# Patient Record
Sex: Male | Born: 1940
Health system: Southern US, Community
[De-identification: ages and names within clinical notes are randomized; demographics above are authoritative.]

## PROBLEM LIST (undated history)

## (undated) DIAGNOSIS — R112 Nausea with vomiting, unspecified: Secondary | ICD-10-CM

## (undated) DIAGNOSIS — I10 Essential (primary) hypertension: Secondary | ICD-10-CM

## (undated) DIAGNOSIS — M79606 Pain in leg, unspecified: Secondary | ICD-10-CM

## (undated) DIAGNOSIS — I6529 Occlusion and stenosis of unspecified carotid artery: Secondary | ICD-10-CM

## (undated) DIAGNOSIS — I739 Peripheral vascular disease, unspecified: Secondary | ICD-10-CM

## (undated) DIAGNOSIS — I2510 Atherosclerotic heart disease of native coronary artery without angina pectoris: Secondary | ICD-10-CM

## (undated) DIAGNOSIS — E785 Hyperlipidemia, unspecified: Secondary | ICD-10-CM

## (undated) DIAGNOSIS — R51 Headache: Secondary | ICD-10-CM

## (undated) DIAGNOSIS — B059 Measles without complication: Secondary | ICD-10-CM

## (undated) DIAGNOSIS — R972 Elevated prostate specific antigen [PSA]: Secondary | ICD-10-CM

## (undated) DIAGNOSIS — J301 Allergic rhinitis due to pollen: Secondary | ICD-10-CM

## (undated) DIAGNOSIS — B269 Mumps without complication: Secondary | ICD-10-CM

## (undated) DIAGNOSIS — G459 Transient cerebral ischemic attack, unspecified: Secondary | ICD-10-CM

## (undated) DIAGNOSIS — E119 Type 2 diabetes mellitus without complications: Secondary | ICD-10-CM

## (undated) DIAGNOSIS — G2581 Restless legs syndrome: Secondary | ICD-10-CM

## (undated) DIAGNOSIS — N529 Male erectile dysfunction, unspecified: Secondary | ICD-10-CM

## (undated) DIAGNOSIS — B019 Varicella without complication: Secondary | ICD-10-CM

## (undated) DIAGNOSIS — R61 Generalized hyperhidrosis: Secondary | ICD-10-CM

## (undated) DIAGNOSIS — G629 Polyneuropathy, unspecified: Secondary | ICD-10-CM

## (undated) DIAGNOSIS — G43409 Hemiplegic migraine, not intractable, without status migrainosus: Secondary | ICD-10-CM

## (undated) DIAGNOSIS — Z87442 Personal history of urinary calculi: Secondary | ICD-10-CM

## (undated) DIAGNOSIS — J90 Pleural effusion, not elsewhere classified: Secondary | ICD-10-CM

## (undated) DIAGNOSIS — R4781 Slurred speech: Secondary | ICD-10-CM

## (undated) HISTORY — DX: Personal history of urinary calculi: Z87.442

## (undated) HISTORY — PX: VASECTOMY: SHX75

## (undated) HISTORY — DX: Pleural effusion, not elsewhere classified: J90

## (undated) HISTORY — DX: Nausea with vomiting, unspecified: R11.2

## (undated) HISTORY — DX: Hemiplegic migraine, not intractable, without status migrainosus: G43.409

## (undated) HISTORY — DX: Varicella without complication: B01.9

## (undated) HISTORY — DX: Occlusion and stenosis of unspecified carotid artery: I65.29

## (undated) HISTORY — DX: Restless legs syndrome: G25.81

## (undated) HISTORY — DX: Headache: R51

## (undated) HISTORY — DX: Polyneuropathy, unspecified: G62.9

## (undated) HISTORY — DX: Type 2 diabetes mellitus without complications: E11.9

## (undated) HISTORY — DX: Hyperlipidemia, unspecified: E78.5

## (undated) HISTORY — DX: Measles without complication: B05.9

## (undated) HISTORY — DX: Atherosclerotic heart disease of native coronary artery without angina pectoris: I25.10

## (undated) HISTORY — DX: Essential (primary) hypertension: I10

## (undated) HISTORY — DX: Peripheral vascular disease, unspecified: I73.9

## (undated) HISTORY — DX: Generalized hyperhidrosis: R61

## (undated) HISTORY — DX: Transient cerebral ischemic attack, unspecified: G45.9

## (undated) HISTORY — DX: Slurred speech: R47.81

## (undated) HISTORY — DX: Elevated prostate specific antigen (PSA): R97.20

## (undated) HISTORY — DX: Pain in leg, unspecified: M79.606

## (undated) HISTORY — DX: Male erectile dysfunction, unspecified: N52.9

## (undated) HISTORY — DX: Mumps without complication: B26.9

## (undated) HISTORY — DX: Allergic rhinitis due to pollen: J30.1

---

## 1971-02-22 HISTORY — PX: PAROTID GLAND TUMOR EXCISION: SHX5221

## 1979-02-22 HISTORY — PX: KNEE SURGERY: SHX244

## 1999-02-22 HISTORY — PX: CORONARY ARTERY BYPASS GRAFT: SHX141

## 1999-02-22 HISTORY — PX: CAROTID ENDARTERECTOMY: SUR193

## 2000-02-22 DIAGNOSIS — I2510 Atherosclerotic heart disease of native coronary artery without angina pectoris: Secondary | ICD-10-CM

## 2000-02-22 HISTORY — DX: Atherosclerotic heart disease of native coronary artery without angina pectoris: I25.10

## 2001-02-21 DIAGNOSIS — R972 Elevated prostate specific antigen [PSA]: Secondary | ICD-10-CM

## 2001-02-21 HISTORY — PX: CHOLECYSTECTOMY: SHX55

## 2001-02-21 HISTORY — DX: Elevated prostate specific antigen (PSA): R97.20

## 2005-02-21 DIAGNOSIS — J90 Pleural effusion, not elsewhere classified: Secondary | ICD-10-CM

## 2005-02-21 HISTORY — PX: LUNG SURGERY: SHX703

## 2005-02-21 HISTORY — PX: OTHER SURGICAL HISTORY: SHX169

## 2005-02-21 HISTORY — DX: Pleural effusion, not elsewhere classified: J90

## 2006-09-15 DIAGNOSIS — I6529 Occlusion and stenosis of unspecified carotid artery: Secondary | ICD-10-CM

## 2006-09-15 HISTORY — DX: Occlusion and stenosis of unspecified carotid artery: I65.29

## 2006-10-25 ENCOUNTER — Ambulatory Visit: Payer: Self-pay | Admitting: Family Medicine

## 2007-01-10 DIAGNOSIS — G2581 Restless legs syndrome: Secondary | ICD-10-CM

## 2007-01-10 HISTORY — DX: Restless legs syndrome: G25.81

## 2007-01-29 ENCOUNTER — Ambulatory Visit: Payer: Self-pay | Admitting: Family Medicine

## 2007-02-19 ENCOUNTER — Emergency Department: Payer: Self-pay | Admitting: Emergency Medicine

## 2007-05-08 DIAGNOSIS — I10 Essential (primary) hypertension: Secondary | ICD-10-CM

## 2007-05-08 HISTORY — DX: Essential (primary) hypertension: I10

## 2007-07-24 DIAGNOSIS — N529 Male erectile dysfunction, unspecified: Secondary | ICD-10-CM

## 2007-07-24 HISTORY — DX: Male erectile dysfunction, unspecified: N52.9

## 2008-01-25 DIAGNOSIS — D239 Other benign neoplasm of skin, unspecified: Secondary | ICD-10-CM

## 2008-01-25 HISTORY — DX: Other benign neoplasm of skin, unspecified: D23.9

## 2008-01-29 ENCOUNTER — Ambulatory Visit: Payer: Self-pay | Admitting: Family Medicine

## 2008-01-31 DIAGNOSIS — G629 Polyneuropathy, unspecified: Secondary | ICD-10-CM

## 2008-01-31 HISTORY — DX: Polyneuropathy, unspecified: G62.9

## 2008-02-28 ENCOUNTER — Ambulatory Visit: Payer: Self-pay | Admitting: Family Medicine

## 2008-03-24 ENCOUNTER — Ambulatory Visit: Payer: Self-pay | Admitting: Family Medicine

## 2008-04-21 ENCOUNTER — Ambulatory Visit: Payer: Self-pay | Admitting: Family Medicine

## 2008-04-24 ENCOUNTER — Encounter: Payer: Self-pay | Admitting: Cardiovascular Disease

## 2008-04-24 LAB — CONVERTED CEMR LAB
ALT: 22 units/L
Albumin: 4.2 g/dL
BUN: 17 mg/dL
Chloride: 101 meq/L
Creatinine, Ser: 1.07 mg/dL
Glucose, Bld: 113 mg/dL
LDL Cholesterol: 69 mg/dL
Potassium: 4.6 meq/L
Total Bilirubin: 0.5 mg/dL
Total Protein: 6.9 g/dL

## 2008-09-09 ENCOUNTER — Ambulatory Visit: Payer: Self-pay | Admitting: Family Medicine

## 2009-06-04 ENCOUNTER — Encounter: Payer: Self-pay | Admitting: Cardiovascular Disease

## 2009-06-05 DIAGNOSIS — I10 Essential (primary) hypertension: Secondary | ICD-10-CM | POA: Insufficient documentation

## 2009-06-05 DIAGNOSIS — J90 Pleural effusion, not elsewhere classified: Secondary | ICD-10-CM | POA: Insufficient documentation

## 2009-06-05 DIAGNOSIS — E1143 Type 2 diabetes mellitus with diabetic autonomic (poly)neuropathy: Secondary | ICD-10-CM | POA: Insufficient documentation

## 2009-06-05 DIAGNOSIS — E785 Hyperlipidemia, unspecified: Secondary | ICD-10-CM | POA: Insufficient documentation

## 2009-06-05 DIAGNOSIS — I25118 Atherosclerotic heart disease of native coronary artery with other forms of angina pectoris: Secondary | ICD-10-CM | POA: Insufficient documentation

## 2009-06-18 ENCOUNTER — Encounter: Payer: Self-pay | Admitting: Cardiovascular Disease

## 2009-06-24 ENCOUNTER — Ambulatory Visit: Payer: Self-pay | Admitting: Cardiovascular Disease

## 2010-01-04 ENCOUNTER — Ambulatory Visit: Payer: Self-pay | Admitting: Cardiovascular Disease

## 2010-01-08 ENCOUNTER — Telehealth: Payer: Self-pay | Admitting: Cardiovascular Disease

## 2010-01-21 ENCOUNTER — Encounter: Payer: Self-pay | Admitting: Cardiovascular Disease

## 2010-01-21 ENCOUNTER — Ambulatory Visit: Payer: Self-pay

## 2010-03-23 NOTE — Assessment & Plan Note (Signed)
Summary: F6M/AMD   Visit Type:  Follow-up Referring Deannah Rossi:  Dr. Nilda Simmer Primary Dajour Pierpoint:  Dr. Nilda Simmer  CC:  shortness of breath-- same.  History of Present Illness: 70 year old gentleman with a history of coronary artery disease, bypass surgery with a LIMA to the LAD, vein graft to the diagonal, diabetes, hypertension, hyperlipidemia, history of pleural effusion requiring a VATS, carotid endarterectomy on the right who presents for routine followup.  overall he is doing well. He does have trace edema though this has improved over the past several months. He's not exercising as he is supporting other members of his family who has been ill. He denies any chest pain or shortness of breath and otherwise is active with no complaints.  Carotid ultrasound in April 2000 and shows patent repair on the right, minimal disease on the left.  Stress test April 2009 shows no significant ischemia. This was a pharmacologic study.  Echocardiogram April 2009 shows normal systolic function, mild LVH, evidence of diastolic relaxation abnormality, moderately elevated right ventricular systolic pressures.  EKG shows normal sinus rhythm with rate 74 beats per minute with no significant ST or T wave changes  Preventive Screening-Counseling & Management  Alcohol-Tobacco     Smoking Status: quit  Caffeine-Diet-Exercise     Does Patient Exercise: yes      Drug Use:  no.    Current Medications (verified): 1)  Plavix 75 Mg Tabs (Clopidogrel Bisulfate) .... Take One Tablet By Mouth Daily 2)  Metformin Hcl 500 Mg Tabs (Metformin Hcl) .[name].. 1 Tab Two Times A Day 3)  Mirapex 0.25 Mg Tabs (Pramipexole Dihydrochloride) .[name].. 1 Tab By Mouth Three Times A Day 4)  Metoprolol Succinate 50 Mg Xr24h-Tab (Metoprolol Succinate) .... Take One Tablet By Mouth Daily 5)  Lipitor 40 Mg Tabs (Atorvastatin Calcium) .... Take 1 1/2 Tablet By Mouth Daily. 6)  Fexofenadine Hcl 180 Mg Tabs (Fexofenadine Hcl) .[name].. 1 Tab  As Needed 7)  Quinapril Hcl 10 Mg Tabs (Quinapril Hcl) .[name].. 1 Tab Daily 8)  Aspirin 81 Mg Tbec (Aspirin) .... Take One Tablet By Mouth Daily 9)  Multivitamins  Tabs (Multiple Vitamin) .[name].. 1 Tab Daily 10)  Nitrostat 0.4 Mg Subl (Nitroglycerin) .[name].. 1 Tablet Under Tongue At Onset of Chest Pain; You May Repeat Every 5 Minutes For Up To 3 Doses. 11)  Furosemide 20 Mg Tabs (Furosemide) .[name].. 1 Tab Daily As Needed 12)  Gabapentin 100 Mg Caps (Gabapentin) .[name].. 1 Tab Daily 13)  Finasteride 5 Mg Tabs (Finasteride) .[name].. 1 Tab Daily 14)  Fish Oil 1000 Mg Caps (Omega-3 Fatty Acids) .... Take 1 Tablet By Mouth Two Times A Day  Allergies (verified): 1)  ! * Contrast Dye 2)  ! * Dog Hair 3)  ! [facility] 4)  ! Leitha Bleak  Past History:  Past Medical History: Last updated: 06/04/2009 CAD -- bypass DM HTN hyperlipidemia pleural effusion  Past Surgical History: Last updated: 06/24/2009 bypass  Carotid Endarterectomy in 2001 Collapse Lung in 2007  Parotid Tumor in 1973  Family History: Last updated: 01/04/2010 Family History of Cancer:  Family History of Coronary Artery Disease:   Social History: Last updated: 01/04/2010 Retired  Married  Tobacco Use - Former.  Alcohol Use - no Regular Exercise - yes Drug Use - no  Risk Factors: Exercise: yes (01/04/2010)  Risk Factors: Smoking Status: quit (01/04/2010)  Family History: Family History of Cancer:  Family History of Coronary Artery Disease:   Social History: Retired  Married  Tobacco Use - Former.  Alcohol Use - no Regular Exercise - yes Drug Use - no Smoking Status:  quit Does Patient Exercise:  yes Drug Use:  no  Review of Systems  The patient denies fever, weight loss, weight gain, vision loss, decreased hearing, hoarseness, chest pain, syncope, dyspnea on exertion, peripheral edema, prolonged cough, abdominal pain, incontinence, muscle weakness, depression, and enlarged lymph nodes.    Vital  Signs:  Patient profile:   70 year old male Height:      68 inches Weight:      217 pounds BMI:     33.11 Pulse rate:   74 / minute BP sitting:   130 / 72  (right arm) Cuff size:   regular  Vitals Entered By: Hardin Negus, RMA (January 04, 2010 4:04 PM)  Physical Exam  General:  well-appearing middle-aged gentleman in no apparent distress, HEENT exam is benign, oropharynx is clear, neck is supple with no JVP or carotid bruits, heart sounds are regular with S1-S2 no murmurs appreciated, lungs are clear to auscultation with no wheezes or Rales, abdominal exam is benign, Trace lower extremity edema b/l, neurologic exam is grossly nonfocal, skin is warm and dry. Pulses are equal and symmetrical in his upper and lower extremities.   Impression & Recommendations:  Problem # 1:  CAROTID ENDARTERECTOMY, RIGHT, HX OF (ICD-V15.1) we'll continue aggressive lipid management. Would continue Lipitor. his lipid panel was excellent in March of this year. We will try to identify if he was taking Lipitor 40 mg or 60 mg at this time. We will order a carotid ultrasound be done early next year.  Orders: Carotid Duplex (Carotid Duplex)  Problem # 2:  HYPERLIPIDEMIA (ICD-272.4) goal LDL is less than 70. He achieved this goals by our records in March of this year.  His updated medication list for this problem includes:    Lipitor 40 Mg Tabs (Atorvastatin calcium) .[name]... Take 1 1/2 tablet by mouth daily.  Problem # 3:  DIABETES MELLITUS, TYPE II (ICD-250.00) We have stressed to him the importance of weight, diet and watching his sugars.  His updated medication list for this problem includes:    Metformin Hcl 500 Mg Tabs (Metformin hcl) .[name]... 1 tab two times a day    Quinapril Hcl 10 Mg Tabs (Quinapril hcl) .[name]... 1 tab daily    Aspirin 81 Mg Tbec (Aspirin) .[name]... Take one tablet by mouth daily  Problem # 4:  CAD (ICD-414.00) No symptoms of coronary artery disease. No further testing at this  time.  His updated medication list for this problem includes:    Plavix 75 Mg Tabs (Clopidogrel bisulfate) .[name]... Take one tablet by mouth daily    Metoprolol Succinate 50 Mg Xr24h-tab (Metoprolol succinate) .[name]... Take one tablet by mouth daily    Quinapril Hcl 10 Mg Tabs (Quinapril hcl) .[name]... 1 tab daily    Aspirin 81 Mg Tbec (Aspirin) .[name]... Take one tablet by mouth daily    Nitrostat 0.4 Mg Subl (Nitroglycerin) .[name]... 1 tablet under tongue at onset of chest pain; you may repeat every 5 minutes for up to 3 doses.  Orders: EKG w/ Interpretation (93000)  Patient Instructions: 1)  Your physician recommends that you schedule a follow-up appointment in: 6 months 2)  Your physician has requested that you have a carotid duplex. This test is an ultrasound of the carotid arteries in your neck. It looks at blood flow through these arteries that supply the brain with blood. Allow one hour  for this exam. There are no restrictions or special instructions.

## 2010-03-23 NOTE — Assessment & Plan Note (Signed)
Summary: NP6/AMD   Visit Type:  Initial Consult Referring Provider:  Dr. Nilda Simmer Primary Provider:  Dr. Nilda Simmer  CC:  Patient have no edema in ankle, feet, and or hand. No complaint of SOB or chest pain or discomfort. Patient is visiting Dr. Mariah Milling for routine annual check up.[name]  History of Present Illness: 70 year old gentleman with a history of coronary artery disease, bypass surgery with a LIMA to the LAD, vein graft to the diagonal, diabetes, hypertension, hyperlipidemia, history of pleural effusion requiring a VATS, carotid endarterectomy on the right who presents to reestablish care. He was last seen at [facility] by myself in May of 2010.  Overall he states that he feels well. He denies any significant shortness of breath or chest pain. He is exercising at the gym 3-4 times per week under the guidance of his daughter who is an exercise trainer. He works out for 30 minutes at a time and sometimes more. He denies any significant lower extremity edema.  Carotid ultrasound in April 2000 and shows patent repair on the right, minimal disease on the left.  Stress test April 2009 shows no significant ischemia. This was a pharmacologic study.  Echocardiogram April 2009 shows normal systolic function, mild LVH, evidence of diastolic relaxation abnormality, moderately elevated right ventricular systolic pressures.  Current Medications (verified): 1)  Plavix 75 Mg Tabs (Clopidogrel Bisulfate) .... Take One Tablet By Mouth Daily 2)  Metformin Hcl 500 Mg Tabs (Metformin Hcl) .[name].. 1 Tab Two Times A Day 3)  Mirapex 0.25 Mg Tabs (Pramipexole Dihydrochloride) .[name].. 1 Tab By Mouth Three Times A Day 4)  Toprol Xl 50 Mg Xr24h-Tab (Metoprolol Succinate) .[name].. 1 Tab Daily 5)  Flomax 0.4 Mg Caps (Tamsulosin Hcl) .[name].. 1 Tab Daily 6)  Lipitor 60 Mg Tabs (Atorvastatin Calcium) .[name].. 1 Tab At Bedtime 7)  Fexofenadine Hcl 180 Mg Tabs (Fexofenadine Hcl) .[name].. 1 Tab As Needed 8)   Quinapril Hcl 10 Mg Tabs (Quinapril Hcl) .[name].. 1 Tab Daily 9)  Aspirin 81 Mg Tbec (Aspirin) .... Take One Tablet By Mouth Daily 10)  Multivitamins  Tabs (Multiple Vitamin) .[name].. 1 Tab Daily 11)  Nitrostat 0.4 Mg Subl (Nitroglycerin) .[name].. 1 Tablet Under Tongue At Onset of Chest Pain; You May Repeat Every 5 Minutes For Up To 3 Doses. 12)  Furosemide 20 Mg Tabs (Furosemide) .[name].. 1 Tab Daily As Needed 13)  Gabapentin 100 Mg Caps (Gabapentin) .[name].. 1 Tab Daily 14)  Finasteride 5 Mg Tabs (Finasteride) .[name].. 1 Tab Daily 15)  Fish Oil 1000 Mg Caps (Omega-3 Fatty Acids) .... Take 1 Tablet By Mouth Two Times A Day  Allergies (verified): 1)  ! * Contrast Dye 2)  ! * Dog Hair 3)  ! [facility] 4)  ! Leitha Bleak  Past History:  Past Surgical History: bypass  Carotid Endarterectomy in 2001 Collapse Lung in 2007  Parotid Tumor in 1973  Review of Systems  The patient denies fever, weight loss, weight gain, vision loss, decreased hearing, hoarseness, chest pain, syncope, dyspnea on exertion, peripheral edema, prolonged cough, abdominal pain, incontinence, muscle weakness, depression, and enlarged lymph nodes.    Vital Signs:  Patient profile:   70 year old male Height:      68 inches Weight:      217 pounds BMI:     33.11 Pulse rate:   70 / minute BP sitting:   134 / 76  (left arm) Cuff size:   large  Physical  Exam  General:  well-appearing middle-aged gentleman in no apparent distress, HEENT exam is benign, oropharynx is clear, neck is supple with no JVP or carotid bruits, heart sounds are regular with S1-S2 no murmurs appreciated, lungs are clear to auscultation with no wheezes or Rales, abdominal exam is benign, no significant lower extremity edema, neurologic exam is grossly nonfocal, skin is warm and dry. Pulses are equal and symmetrical in his upper and lower extremities.    EKG  Procedure date:  06/24/2009  Findings:      normal sinus rhythm with rate 70 beats per minute, no  significant ST or T wave changes.  Impression & Recommendations:  Problem # 1:  CAD (ICD-414.00) history of CAD and bypass surgery x2. Last stress test was -2 years ago. He is exercising with no symptoms. We'll hold off on any additional stress testing and at this time discussed this with him on his next visit. We'll continue his current medications.  We will continue him on 2 baby aspirins.  His updated medication list for this problem includes:    Plavix 75 Mg Tabs (Clopidogrel bisulfate) .[name]... Take one tablet by mouth daily    Quinapril Hcl 10 Mg Tabs (Quinapril hcl) .[name]... 1 tab daily    Aspirin 81 Mg Tbec (Aspirin) .[name]... Take two tablet by mouth daily    Nitrostat 0.4 Mg Subl (Nitroglycerin) .[name]... 1 tablet under tongue at onset of chest pain; you may repeat every 5 minutes for up to 3 doses.  Problem # 2:  HYPERLIPIDEMIA (ICD-272.4) He is currently on Lipitor 60 mg daily. We will try to obtain his most recent lipid panel from Dr. Katrinka Blazing for our records.  Problem # 3:  DIABETES MELLITUS, TYPE II (ICD-250.00) he does not check his sugars at frequently and states that his numbers are typically been 120. His weight has been stable.  His updated medication list for this problem includes:    Metformin Hcl 500 Mg Tabs (Metformin hcl) .[name]... 1 tab two times a day    Quinapril Hcl 10 Mg Tabs (Quinapril hcl) .[name]... 1 tab daily    Aspirin 81 Mg Tbec (Aspirin) .[name]... Take one tablet by mouth daily  Problem # 4:  HYPERTENSION (ICD-401.9) Blood pressure is well-controlled on his current medication regimen.   His updated medication list for this problem includes:    Quinapril Hcl 10 Mg Tabs (Quinapril hcl) .[name]... 1 tab daily    Aspirin 81 Mg Tbec (Aspirin) .[name]... Take one tablet by mouth daily    Furosemide 20 Mg Tabs (Furosemide) .[name]... 1 tab daily as needed

## 2010-03-23 NOTE — Miscellaneous (Signed)
Summary: Visual merchandiser   Imported By: Harlon Flor 06/24/2009 11:02:46  _____________________________________________________________________  External Attachment:    Type:   Image     Comment:   External Document

## 2010-03-23 NOTE — Progress Notes (Signed)
Summary: PHI  PHI   Imported By: Harlon Flor 06/25/2009 10:31:55  _____________________________________________________________________  External Attachment:    Type:   Image     Comment:   External Document

## 2010-03-23 NOTE — Progress Notes (Signed)
Summary: plavix  Phone Note Call from Patient   Caller: Patient Call For: plavix Summary of Call: pt was in last week and needed plavix samples. we didn't have any samples and nurse told him to call back today to see if we have any samples.  Initial call taken by: Lysbeth Galas CMA,  January 08, 2010 11:07 AM  Follow-up for Phone Call        Called spoke with pt advised left 2 boxes of Plavix samples at front desk for pick-up.  Follow-up by: Cloyde Reams RN,  January 08, 2010 2:06 PM

## 2010-03-23 NOTE — Progress Notes (Signed)
Summary: RECORD RELEASE  RECORD RELEASE   Imported By: Frazier Butt Chriscoe 06/18/2009 11:41:10  _____________________________________________________________________  External Attachment:    Type:   Image     Comment:   External Document

## 2010-08-09 LAB — LIPID PANEL: Cholesterol: 144 mg/dL (ref 0–200)

## 2010-09-06 ENCOUNTER — Encounter: Payer: Self-pay | Admitting: Cardiovascular Disease

## 2010-12-06 ENCOUNTER — Inpatient Hospital Stay: Payer: Self-pay | Admitting: *Deleted

## 2011-02-10 ENCOUNTER — Ambulatory Visit: Payer: Self-pay | Admitting: Cardiovascular Disease

## 2011-02-11 ENCOUNTER — Encounter: Payer: Self-pay | Admitting: Cardiovascular Disease

## 2011-02-11 ENCOUNTER — Ambulatory Visit (INDEPENDENT_AMBULATORY_CARE_PROVIDER_SITE_OTHER): Payer: Medicare Other | Admitting: Cardiovascular Disease

## 2011-02-11 DIAGNOSIS — E119 Type 2 diabetes mellitus without complications: Secondary | ICD-10-CM

## 2011-02-11 DIAGNOSIS — I10 Essential (primary) hypertension: Secondary | ICD-10-CM

## 2011-02-11 DIAGNOSIS — E785 Hyperlipidemia, unspecified: Secondary | ICD-10-CM

## 2011-02-11 DIAGNOSIS — I2510 Atherosclerotic heart disease of native coronary artery without angina pectoris: Secondary | ICD-10-CM

## 2011-02-11 DIAGNOSIS — G459 Transient cerebral ischemic attack, unspecified: Secondary | ICD-10-CM | POA: Insufficient documentation

## 2011-02-11 NOTE — Progress Notes (Signed)
Patient ID: ONTARIO PETTENGILL, male    DOB: [birth year], 70 y.o.   MRN: [id number]  HPI Comments: 70 year old gentleman with a history of coronary artery disease, bypass surgery with a LIMA to the LAD, vein graft to the diagonal, diabetes, hypertension, hyperlipidemia, history of pleural effusion requiring a VATS, carotid endarterectomy on the right who presents for routine followup.  He reports having recent TIA symptoms with admission to the hospital at [facility] October 2012. He had a head CT him a MRI, carotid Dopplers, echocardiogram that was essentially normal. Symptoms included severe headache, tingling of the left arm and left face, slurred speech, vision changes. He was seen by neurology in [place] who felt he was having a complex migraine. He does report a previous TIA in 2001. No further symptoms since that time.   Otherwise he is doing well.  He's not exercising. He denies any chest pain or shortness of breath and otherwise is active with no complaints.   Stress test April 2009 shows no significant ischemia. This was a pharmacologic study.   EKG shows normal sinus rhythm with rate 67 beats per minute with no significant ST or T wave changes    Outpatient Encounter Prescriptions as of 02/11/2011  Medication Sig Dispense Refill  . aspirin 325 MG tablet Take 325 mg by mouth daily.        [name] atorvastatin (LIPITOR) 40 MG tablet Take 40 mg by mouth daily. Take 1.5 tabs       . clopidogrel (PLAVIX) 75 MG tablet Take 75 mg by mouth daily.        . finasteride (PROSCAR) 5 MG tablet Take 5 mg by mouth daily.        . furosemide (LASIX) 20 MG tablet Take 20 mg by mouth daily as needed.       . gabapentin (NEURONTIN) 100 MG capsule Take 100 mg by mouth daily.        . metFORMIN (GLUMETZA) 500 MG (MOD) 24 hr tablet Take 500 mg by mouth 2 (two) times daily.        . metoprolol (TOPROL-XL) 50 MG 24 hr tablet Take 50 mg by mouth daily.        . Multiple Vitamins-Minerals (MULTIVITAL) tablet Take 1  tablet by mouth daily.        . nitroGLYCERIN (NITROSTAT) 0.4 MG SL tablet Place 0.4 mg under the tongue every 5 (five) minutes as needed.        . Omega-3 Fatty Acids (FISH OIL) 1000 MG CAPS Take by mouth 2 (two) times daily.        . pramipexole (MIRAPEX) 0.25 MG tablet Take 0.25 mg by mouth 3 (three) times daily.        . quinapril (ACCUPRIL) 10 MG tablet Take 10 mg by mouth daily.          Review of Systems  Constitutional: Negative.   HENT: Negative.   Eyes: Negative.   Respiratory: Negative.   Cardiovascular: Negative.   Gastrointestinal: Negative.   Musculoskeletal: Negative.   Skin: Negative.   Neurological: Negative.   Hematological: Negative.   Psychiatric/Behavioral: Negative.   All other systems reviewed and are negative.    BP 110/64  Pulse 67  Ht 5' 8.5" (1.74 m)  Wt 215 lb 6.4 oz (97.705 kg)  BMI 32.28 kg/m2  Physical Exam  Nursing note and vitals reviewed. Constitutional: He is oriented to person, place, and time. He appears well-developed and well-nourished.  HENT:  Head: Normocephalic.  Nose: Nose normal.  Mouth/Throat: Oropharynx is clear and moist.  Eyes: Conjunctivae are normal. Pupils are equal, round, and reactive to light.  Neck: Normal range of motion. Neck supple. No JVD present.  Cardiovascular: Normal rate, regular rhythm, S1 normal, S2 normal, normal heart sounds and intact distal pulses.  Exam reveals no gallop and no friction rub.   No murmur heard. Pulmonary/Chest: Effort normal and breath sounds normal. No respiratory distress. He has no wheezes. He has no rales. He exhibits no tenderness.  Abdominal: Soft. Bowel sounds are normal. He exhibits no distension. There is no tenderness.  Musculoskeletal: Normal range of motion. He exhibits no edema and no tenderness.  Lymphadenopathy:    He has no cervical adenopathy.  Neurological: He is alert and oriented to person, place, and time. Coordination normal.  Skin: Skin is warm and dry. No rash  noted. No erythema.  Psychiatric: He has a normal mood and affect. His behavior is normal. Judgment and thought content normal.           Assessment and Plan

## 2011-02-11 NOTE — Assessment & Plan Note (Signed)
Cholesterol is mildly elevated. We have asked him to try to lose some weight. Goal LDL less than 70. If he is unable to lose weight, we could consider increasing the Lipitor.

## 2011-02-11 NOTE — Assessment & Plan Note (Signed)
Currently with no symptoms of angina. No further workup at this time. Continue current medication regimen. 

## 2011-02-11 NOTE — Assessment & Plan Note (Addendum)
Symptoms concerning for TIA.  If he has additional symptoms in the future, we would order a saline bubble study to exclude PFO. Additional episodes and we would also suggest changing to warfarin. If he does have a PFO, this could potentially be closed if he has additional episodes. I am concerned about him being on high-dose aspirin and Plavix. I suggested he decrease the aspirin to 81 mg x2 with Plavix.

## 2011-02-11 NOTE — Assessment & Plan Note (Signed)
Blood pressure is well controlled on today's visit. No changes made to the medications. 

## 2011-02-11 NOTE — Patient Instructions (Signed)
You are doing well. No medication changes were made.  Please call us if you have new issues that need to be addressed before your next appt.  Your physician wants you to follow-up in: 6 months.  You will receive a reminder letter in the mail two months in advance. If you don't receive a letter, please call our office to schedule the follow-up appointment.   

## 2011-02-11 NOTE — Assessment & Plan Note (Signed)
We have encouraged continued exercise, careful diet management in an effort to lose weight. 

## 2011-03-11 ENCOUNTER — Ambulatory Visit: Payer: Self-pay | Admitting: Family Medicine

## 2011-04-13 ENCOUNTER — Encounter: Payer: Self-pay | Admitting: *Deleted

## 2011-04-27 ENCOUNTER — Ambulatory Visit: Payer: Self-pay | Admitting: Family Medicine

## 2011-05-13 LAB — COMPREHENSIVE METABOLIC PANEL
Albumin: 4 g/dL (ref 3.4–5.0)
Alkaline Phosphatase: 73 U/L (ref 50–136)
BUN: 22 mg/dL — ABNORMAL HIGH (ref 7–18)
Bilirubin,Total: 0.3 mg/dL (ref 0.2–1.0)
Creatinine: 0.82 mg/dL (ref 0.60–1.30)
Glucose: 110 mg/dL — ABNORMAL HIGH (ref 65–99)
Osmolality: 278 (ref 275–301)
Potassium: 4.5 mmol/L (ref 3.5–5.1)
Sodium: 137 mmol/L (ref 136–145)
Total Protein: 7.5 g/dL (ref 6.4–8.2)

## 2011-05-13 LAB — CBC
Platelet: 236 10*3/uL (ref 150–440)
RBC: 4.89 10*6/uL (ref 4.40–5.90)
RDW: 14.6 % — ABNORMAL HIGH (ref 11.5–14.5)
WBC: 9.9 10*3/uL (ref 3.8–10.6)

## 2011-05-14 ENCOUNTER — Inpatient Hospital Stay: Payer: Self-pay | Admitting: Internal Medicine

## 2011-05-14 LAB — URINALYSIS, COMPLETE
Bacteria: NONE SEEN
Leukocyte Esterase: NEGATIVE
Nitrite: NEGATIVE
Ph: 5 (ref 4.5–8.0)
Protein: 30
RBC,UR: 14 /HPF (ref 0–5)
Specific Gravity: 1.044 (ref 1.003–1.030)

## 2011-05-14 LAB — CK TOTAL AND CKMB (NOT AT ARMC)
CK, Total: 66 U/L (ref 35–232)
CK, Total: 70 U/L (ref 35–232)
CK, Total: 64 U/L (ref 35–232)
CK-MB: 1.1 ng/mL (ref 0.5–3.6)
CK-MB: 1 ng/mL (ref 0.5–3.6)

## 2011-05-14 LAB — TROPONIN I
Troponin-I: <0.02 ng/mL
Troponin-I: <0.02 ng/mL

## 2011-05-15 LAB — BASIC METABOLIC PANEL
Anion Gap: 14 (ref 7–16)
Calcium, Total: 8.3 mg/dL — ABNORMAL LOW (ref 8.5–10.1)
Co2: 23 mmol/L (ref 21–32)
EGFR (African American): >60
Potassium: 3.8 mmol/L (ref 3.5–5.1)
Sodium: 141 mmol/L (ref 136–145)

## 2011-05-15 LAB — CBC WITH DIFFERENTIAL/PLATELET
Basophil %: 0.2 %
Eosinophil %: 0.1 %
HCT: 35.8 % — ABNORMAL LOW (ref 40.0–52.0)
HGB: 12 g/dL — ABNORMAL LOW (ref 13.0–18.0)
Lymphocyte #: 2.8 10*3/uL (ref 1.0–3.6)
Lymphocyte %: 27.4 %
MCHC: 33.6 g/dL (ref 32.0–36.0)
Monocyte #: 1.1 10*3/uL — ABNORMAL HIGH (ref 0.0–0.7)
Monocyte %: 10.4 %
Neutrophil #: 6.4 10*3/uL (ref 1.4–6.5)
RBC: 4.07 10*6/uL — ABNORMAL LOW (ref 4.40–5.90)
RDW: 14.7 % — ABNORMAL HIGH (ref 11.5–14.5)
WBC: 10.4 10*3/uL (ref 3.8–10.6)

## 2011-05-15 LAB — LIPID PANEL
Cholesterol: 128 mg/dL
HDL Cholesterol: 29 mg/dL — ABNORMAL LOW
Ldl Cholesterol, Calc: 66 mg/dL
Triglycerides: 167 mg/dL
VLDL Cholesterol, Calc: 33 mg/dL

## 2011-05-15 LAB — TROPONIN I: Troponin-I: <0.02 ng/mL

## 2011-05-18 ENCOUNTER — Telehealth: Payer: Self-pay | Admitting: Cardiovascular Disease

## 2011-05-18 NOTE — Telephone Encounter (Signed)
Could start HCTZ Dutoprol is metoprolol with HCTZ He will likely need clonidine as well given how high his numbers are. He needs to cut back on his salt intake. Is his weight elevated from before?

## 2011-05-18 NOTE — Telephone Encounter (Signed)
Pt called stating that he was in the hospital over the weekend. Pt states that he is still having BP issues. Its staying in the low to mid 80's / 90's. Pt would like to speak to a nurse

## 2011-05-18 NOTE — Telephone Encounter (Signed)
Kevin Choi also was wondering about the medication, Dutoprol.  He knows someone who is on this medication and that person's bp went down dramatically after starting it.

## 2011-05-18 NOTE — Telephone Encounter (Signed)
Kevin Choi is calling to report that he was admitted to [facility] for an elevated bp.  Since being discharged from [facility] on Sunday, his bp has remained high.  Today it is running 182/93, 189/89, 193/85, 188/85, 170/85.  These readings were throughout the day, the first one was prior to taking his meds.  He is taking Metoprolol 50mg  at 4:00pm and Quinipril 20mg  at 8:00am.  His quinipril was increased yesterday from 10mg -20mg  by his pcp, Dr Katrinka Blazing.  He was also given an rx for Clonidine and HCTZ (he thinks) but didn't fill them.  He states his pcp told him not to to fill them yet.  He states his pcp told him he needs to be evaluated by Dr Mariah Milling within 1-2 weeks.  Please advise on what he is to do about his elevated bp and an appt.  Thanks.

## 2011-05-19 NOTE — Telephone Encounter (Signed)
Mrs Maher was notified and states he weighed 202# at the hospital.  He weighed 215# in Dec., but Mrs Schueller states he has swelling in his ankles and "looks puffy".  They are requesting that Dr Mariah Milling order a bubble test.  He will start the rx for HCTZ 25mg .  Do you want him to start the Clonidine 0.2mg  now or wait?  He also wants to know if he needs a Cardiology appt soon?

## 2011-05-19 NOTE — Telephone Encounter (Signed)
Would probably hold off on starting clonidine and use HCTZ for now with close monitoring of the blood pressure.  I don't know what a bubble test is? is that an echocardiogram? We'll put him on the schedule for next available with me.

## 2011-05-23 ENCOUNTER — Encounter: Payer: Self-pay | Admitting: Cardiovascular Disease

## 2011-05-23 ENCOUNTER — Ambulatory Visit (INDEPENDENT_AMBULATORY_CARE_PROVIDER_SITE_OTHER): Payer: Medicare Other | Admitting: Cardiovascular Disease

## 2011-05-23 VITALS — BP 130/70 | HR 93 | Ht 68.0 in | Wt 207.5 lb

## 2011-05-23 DIAGNOSIS — I10 Essential (primary) hypertension: Secondary | ICD-10-CM

## 2011-05-23 DIAGNOSIS — E785 Hyperlipidemia, unspecified: Secondary | ICD-10-CM

## 2011-05-23 DIAGNOSIS — I2510 Atherosclerotic heart disease of native coronary artery without angina pectoris: Secondary | ICD-10-CM

## 2011-05-23 DIAGNOSIS — G459 Transient cerebral ischemic attack, unspecified: Secondary | ICD-10-CM

## 2011-05-23 DIAGNOSIS — E119 Type 2 diabetes mellitus without complications: Secondary | ICD-10-CM

## 2011-05-23 MED ORDER — RIVAROXABAN 20 MG PO TABS: 20.0000 mg | ORAL_TABLET | Freq: Every day | ORAL | Status: DC

## 2011-05-23 NOTE — Telephone Encounter (Signed)
Spoke with pt wife, aware of dr [employer] recommendations. They have an appointment with dr Mariah Milling this morning.

## 2011-05-23 NOTE — Assessment & Plan Note (Signed)
Cholesterol is at goal on the current lipid regimen. No changes to the medications were made.  

## 2011-05-23 NOTE — Assessment & Plan Note (Signed)
Blood pressure is improved on his car medications. Heart rate is mildly elevated. We have asked him to increase his fluid intake, monitor his blood pressure and heart rate. His heart rate continues to be elevated in the 90s, we could increase his metoprolol at a later date.

## 2011-05-23 NOTE — Patient Instructions (Addendum)
Please start xarelto one a day  Stop plavix and aspirin   Please call us if you have new issues that need to be addressed before your next appt.  Your physician wants you to follow-up in: 6 months.  You will receive a reminder letter in the mail two months in advance. If you don't receive a letter, please call our office to schedule the follow-up appointment.

## 2011-05-23 NOTE — Assessment & Plan Note (Signed)
Currently with no symptoms of angina. No further workup at this time. Continue current medication regimen. 

## 2011-05-23 NOTE — Assessment & Plan Note (Signed)
Second TIA in several months despite being on aspirin and Plavix. We have suggested he start warfarin. He is not particularly eager to try warfarin and would prefer an alternative. We have suggested he could start xarelto 20 mg daily. We will hold the aspirin and Plavix for now. Given he is on stronger anticoagulation, he would likely not need a bubble study to look for PFO as this would be the treatment for positive bubble study. We have ordered a 2 day Holter as he does report occasional palpitations. We would be watching for atrial fibrillation.

## 2011-05-23 NOTE — Assessment & Plan Note (Signed)
Hemoglobin A1c 7.2. We have encouraged continued exercise, careful diet management in an effort to lose weight.  

## 2011-05-23 NOTE — Progress Notes (Signed)
Patient ID: Kevin Choi, male    DOB: September 21, 1940, 71 y.o.   MRN: [id number]  HPI Comments: 71 year old gentleman with a history of coronary artery disease, bypass surgery with a LIMA to the LAD, vein graft to the diagonal, diabetes, hypertension, hyperlipidemia, history of pleural effusion requiring a VATS, carotid endarterectomy on the right who presents for routine followup. He has had TIA symptoms twice, once October 2012 and now again May 14 2011.  He reports that on March 23, he was doing well when he had acute onset of slurred speech. He was playing cards shortly after and he was unable to do any math. Symptoms lasted for approximately one to 2 hours. Blood pressure prior to this event had been well controlled though during and after the event, he had severe hypertension with systolic pressures sometimes greater than 200. He was in the emergency room for 2 days with extensive testing.  Previous TIA symptoms in October 2012. Please see prior note.  During his recent hospital visit, he had carotid ultrasound that showed no hemodynamically significant stenosis Head CT scan showing no acute stroke MRI of the head showing no infarction. Stable appearing area of enhancement of the right temporal lobe most compatible with meningioma Chest x-ray was essentially normal  He was HCTZ several days ago and blood pressure has improved from 170 range down to 130s over 60s He has had a difficult time recovering from his recent event and feels weaker. He is able to walk less than one block secondary to fatigue.   Stress test April 2009 shows no significant ischemia. This was a pharmacologic study.  Recent lab work shows total cholesterol 128, LDL 66, HDL 29  Hemoglobin A1c 7.2 EKG shows normal sinus rhythm with rate 93 beats per minute with no significant ST or T wave changes    Outpatient Encounter Prescriptions as of 05/23/2011  Medication Sig Dispense Refill  . aspirin 81 MG tablet Take 81 mg by  mouth 2 (two) times daily.      [name] atorvastatin (LIPITOR) 40 MG tablet Take 40 mg by mouth daily.       . clopidogrel (PLAVIX) 75 MG tablet Take 75 mg by mouth daily.        . finasteride (PROSCAR) 5 MG tablet Take 5 mg by mouth daily.        . furosemide (LASIX) 20 MG tablet Take 20 mg by mouth daily as needed.       . gabapentin (NEURONTIN) 100 MG capsule Take 200 mg by mouth 3 (three) times daily.       . hydrochlorothiazide (HYDRODIURIL) 25 MG tablet Take 25 mg by mouth daily.      . metFORMIN (GLUMETZA) 500 MG (MOD) 24 hr tablet Take 500 mg by mouth 2 (two) times daily.        . metoprolol (TOPROL-XL) 50 MG 24 hr tablet Take 50 mg by mouth daily.        . Multiple Vitamins-Minerals (MULTIVITAL) tablet Take 1 tablet by mouth daily.        . nitroGLYCERIN (NITROSTAT) 0.4 MG SL tablet Place 0.4 mg under the tongue every 5 (five) minutes as needed.        . Omega-3 Fatty Acids (FISH OIL) 1000 MG CAPS Take by mouth 2 (two) times daily.        . pramipexole (MIRAPEX) 0.25 MG tablet Take 0.25 mg by mouth 3 (three) times daily.        . quinapril (  ACCUPRIL) 10 MG tablet Take 20 mg by mouth daily.        Review of Systems  Constitutional: Negative.   HENT: Negative.   Eyes: Negative.   Respiratory: Negative.   Cardiovascular: Negative.   Gastrointestinal: Negative.   Musculoskeletal: Negative.   Skin: Negative.   Neurological: Negative.   Hematological: Negative.   Psychiatric/Behavioral: Negative.   All other systems reviewed and are negative.   BP 130/70  Pulse 93  Ht 5\' 8"  (1.727 m)  Wt 207 lb 8 oz (94.121 kg)  BMI 31.55 kg/m2  Physical Exam  Nursing note and vitals reviewed. Constitutional: He is oriented to person, place, and time. He appears well-developed and well-nourished.  HENT:  Head: Normocephalic.  Nose: Nose normal.  Mouth/Throat: Oropharynx is clear and moist.  Eyes: Conjunctivae are normal. Pupils are equal, round, and reactive to light.  Neck: Normal range of  motion. Neck supple. No JVD present.  Cardiovascular: Normal rate, regular rhythm, S1 normal, S2 normal, normal heart sounds and intact distal pulses.  Exam reveals no gallop and no friction rub.   No murmur heard. Pulmonary/Chest: Effort normal and breath sounds normal. No respiratory distress. He has no wheezes. He has no rales. He exhibits no tenderness.  Abdominal: Soft. Bowel sounds are normal. He exhibits no distension. There is no tenderness.  Musculoskeletal: Normal range of motion. He exhibits no edema and no tenderness.  Lymphadenopathy:    He has no cervical adenopathy.  Neurological: He is alert and oriented to person, place, and time. Coordination normal.  Skin: Skin is warm and dry. No rash noted. No erythema.  Psychiatric: He has a normal mood and affect. His behavior is normal. Judgment and thought content normal.           Assessment and Plan

## 2011-05-31 ENCOUNTER — Other Ambulatory Visit (HOSPITAL_COMMUNITY): Payer: Medicare Other

## 2011-06-07 ENCOUNTER — Other Ambulatory Visit: Payer: Self-pay

## 2011-06-07 MED ORDER — RIVAROXABAN 20 MG PO TABS: 20.0000 mg | ORAL_TABLET | Freq: Every day | ORAL | Status: DC

## 2011-06-07 MED ORDER — METOPROLOL SUCCINATE ER 50 MG PO TB24: ORAL_TABLET | ORAL | Status: DC

## 2011-06-08 ENCOUNTER — Telehealth: Payer: Self-pay | Admitting: Cardiovascular Disease

## 2011-06-08 NOTE — Telephone Encounter (Signed)
Spoke with pt, he saw Dr Mariah Milling in the office on 05/23/11 started on Xarelto 20mg  qd, samples given in office.  Pt needs rx sent to [facility] mail order pharmacy.  Pt also wanted to know if he should take ASA with Xarelto because when he checks his BS he doesn't bleed very much.  Advised pt bleeding from Blood Sugar finger stick is not a good representation of thickness or thiness of blood.  Advised pt to follow Dr Windell Hummingbird previous recommendations of Xarelto 20mg  qd and d/c ASA and Plavix.  Pt states Dr Mariah Milling increased his Metoprolol as a result of increased palpitations on holter monitor to 75mg  qd and he needs new rx sent to [facility] pharmacy for increased dosage amount. Discussed with Dr Mariah Milling, he advised he did recommend increasing Metoprolol to 75mg  qd, OK to sent rx for 50mg  bid to allow extra in case dosage needs to be adjusted in future. Pt aware rx sent to pharmacy.

## 2011-06-24 ENCOUNTER — Encounter: Payer: Self-pay | Admitting: Cardiovascular Disease

## 2011-08-09 LAB — LIPID PANEL: LDL Cholesterol: 73 mg/dL

## 2011-08-09 LAB — HEMOGLOBIN A1C: Hgb A1c MFr Bld: 6.4 % — AB (ref 4.0–6.0)

## 2011-10-27 ENCOUNTER — Encounter: Payer: Self-pay | Admitting: *Deleted

## 2011-10-27 ENCOUNTER — Encounter: Payer: Self-pay | Admitting: Family Medicine

## 2011-10-31 ENCOUNTER — Encounter: Payer: Self-pay | Admitting: Family Medicine

## 2011-10-31 ENCOUNTER — Ambulatory Visit (INDEPENDENT_AMBULATORY_CARE_PROVIDER_SITE_OTHER): Payer: Medicare Other | Admitting: Family Medicine

## 2011-10-31 VITALS — BP 140/78 | HR 57 | Temp 98.1°F | Resp 16 | Ht 68.0 in | Wt 212.0 lb

## 2011-10-31 DIAGNOSIS — S0101XA Laceration without foreign body of scalp, initial encounter: Secondary | ICD-10-CM

## 2011-10-31 DIAGNOSIS — E785 Hyperlipidemia, unspecified: Secondary | ICD-10-CM

## 2011-10-31 DIAGNOSIS — Z23 Encounter for immunization: Secondary | ICD-10-CM

## 2011-10-31 DIAGNOSIS — G43909 Migraine, unspecified, not intractable, without status migrainosus: Secondary | ICD-10-CM

## 2011-10-31 DIAGNOSIS — I10 Essential (primary) hypertension: Secondary | ICD-10-CM

## 2011-10-31 DIAGNOSIS — I2510 Atherosclerotic heart disease of native coronary artery without angina pectoris: Secondary | ICD-10-CM

## 2011-10-31 DIAGNOSIS — E119 Type 2 diabetes mellitus without complications: Secondary | ICD-10-CM

## 2011-10-31 DIAGNOSIS — B353 Tinea pedis: Secondary | ICD-10-CM

## 2011-10-31 LAB — CBC WITH DIFFERENTIAL/PLATELET
Basophils Relative: 0 % (ref 0–1)
Eosinophils Absolute: 0.2 10*3/uL (ref 0.0–0.7)
Lymphs Abs: 2 10*3/uL (ref 0.7–4.0)
MCH: 28.8 pg (ref 26.0–34.0)
Neutro Abs: 4.5 10*3/uL (ref 1.7–7.7)
Neutrophils Relative %: 60 % (ref 43–77)
Platelets: 229 10*3/uL (ref 150–400)
RBC: 4.82 MIL/uL (ref 4.22–5.81)

## 2011-10-31 LAB — POCT GLYCOSYLATED HEMOGLOBIN (HGB A1C): Hemoglobin A1C: 6

## 2011-10-31 MED ORDER — QUINAPRIL HCL 10 MG PO TABS: 10.0000 mg | ORAL_TABLET | Freq: Every day | ORAL | Status: DC

## 2011-10-31 NOTE — Progress Notes (Signed)
[address]   [place], [place]  [address]   [phone]  Subjective:    Patient ID: Kevin Choi, male    DOB: 1940/04/21, 71 y.o.   MRN: [id number]  HPIThis 71 y.o. male presents to establish care and for three month follow-up:  1.  HTN:  Has computerized log of blood pressures.  Running  140/78 at home; did not check at home this morning; checking every other day.  Running 130-145/70-60s   Pulse 50s.  Denies CP/palp/SOB/leg swelling.  Good compliance with medication; good tolerance of medication; good symptom control.   2.  Laceration scalp:  Nail cut head last week.  Last Tetanus 2012 TDAP.  Would like laceration evaluated.  No loss of consciousness.  3.  DMII: three month follow-up; no changes to management made at last visit;  has computerized log of blood sugars; running 99-124.  Reports good compliance with medication; good tolerance of medication; good symptom control.   4. Hyperlipidemia:  Three month follow-up; no changes to management made at last visit.  Reports good tolerance of medication; good symptom control; good compliance with medication.   5.  Complex Migraines:  No issues since last visit.  S/p consultation with Sherryll Burger in 06/2011.  Appointment 11/2011.  No changes at 06/2011 visit.  No recurrent headaches or focal neurological symptoms.  +auras.  Taking Magnesium daily for prevention.  6. CAD: has appointment coming up for routine follow-up with Dr. Mariah Milling.  Wants to know if blood thin enough.  Would like to have blood evaluated if possible.  7. Tinea Pedis: much improved with Ketoconazole cream.  8.  Flu vaccine: agreeable.    Review of Systems  Constitutional: Negative for fever, chills, diaphoresis and fatigue.  Respiratory: Negative for cough, shortness of breath, wheezing and stridor.   Cardiovascular: Negative for chest pain, palpitations and leg swelling.  Gastrointestinal: Negative for nausea, vomiting and diarrhea.  Skin: Positive for rash and wound.  Negative for color change.  Neurological: Negative for dizziness, tremors, seizures, syncope, facial asymmetry, speech difficulty, weakness, light-headedness, numbness and headaches.        Past Medical History  Diagnosis Date  . CAD (coronary artery disease) 2002    bypass  . Diabetes mellitus   . HTN (hypertension) 05/08/2007  . HLD (hyperlipidemia)   . Pleural effusion 2007    with pneumothorax   . TIA (transient ischemic attack)     Patient stated 2 months ago  . Peripheral neuropathy 01/31/2008  . Peripheral vascular disease   . Headache   . Slurred speech   . Nausea with vomiting   . Hyperhidrosis   . Leg pain   . Personal history of urinary calculi   . Hemiplegic migraine, without mention of intractable migraine without mention of status migrainosus   . Elevated prostate specific antigen (PSA) 02/21/2001  . Allergic rhinitis due to pollen   . Occlusion and stenosis of carotid artery without mention of cerebral infarction 09/15/2006  . Restless legs syndrome (RLS) 01/10/2007  . Impotence of organic origin 07/24/2007  . Chicken pox   . Measles   . Mumps     Past Surgical History  Procedure Date  . Coronary artery bypass graft 2001  . Carotid endarterectomy 2001    right  . Collapse lung 2007  . Parotid gland tumor excision 1973  . Cholecystectomy 2003  . Lung surgery 2007    Left; decortication left lung for persistant pleural effusion and PTX. admission ten days at  [facility].    Prior to Admission medications   Medication Sig Start Date End Date Taking? Authorizing Provider  atorvastatin (LIPITOR) 40 MG tablet Take 40 mg by mouth daily.    Yes Historical Provider, MD  cetirizine (ZYRTEC) 10 MG tablet Take 10 mg by mouth daily.   Yes Historical Provider, MD  finasteride (PROSCAR) 5 MG tablet Take 5 mg by mouth daily.     Yes Historical Provider, MD  gabapentin (NEURONTIN) 100 MG capsule Take 200 mg by mouth 3 (three) times daily.    Yes Historical Provider, MD    metFORMIN (GLUMETZA) 500 MG (MOD) 24 hr tablet Take 500 mg by mouth 2 (two) times daily.     Yes Historical Provider, MD  metoprolol succinate (TOPROL-XL) 50 MG 24 hr tablet Take one and half tablet daily. 06/07/11  Yes Antonieta Iba, MD  Multiple Vitamins-Minerals (MULTIVITAL) tablet Take 1 tablet by mouth daily.     Yes Historical Provider, MD  nitroGLYCERIN (NITROSTAT) 0.4 MG SL tablet Place 0.4 mg under the tongue every 5 (five) minutes as needed.     Yes Historical Provider, MD  Omega-3 Fatty Acids (FISH OIL) 1000 MG CAPS Take by mouth 2 (two) times daily.     Yes Historical Provider, MD  Rivaroxaban (XARELTO) 20 MG TABS Take 20 mg by mouth daily. 06/07/11  Yes Antonieta Iba, MD  furosemide (LASIX) 20 MG tablet Take 1 tablet (20 mg total) by mouth 2 (two) times daily as needed. 12/01/11   Antonieta Iba, MD  hydrALAZINE (APRESOLINE) 50 MG tablet Take 1 tablet (50 mg total) by mouth 3 (three) times daily. 12/01/11   Antonieta Iba, MD  magnesium gluconate (MAGONATE) 500 MG tablet Take 500 mg by mouth daily.    Historical Provider, MD  pramipexole (MIRAPEX) 0.25 MG tablet Take 0.25 mg by mouth 3 (three) times daily.      Historical Provider, MD  quinapril (ACCUPRIL) 20 MG tablet Takes 1 and 1/2 tablet daily.    Historical Provider, MD    Allergies  Allergen Reactions  . Other Nausea Only    Contrast Dye-drops blood pressure.    History   Social History  . Marital Status: Married    Spouse Name: N/A    Number of Children: 2  . Years of Education: college   Occupational History  . retired [employer] 10/2008  .      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 Smoking status: Former Smoker -- 2.0 packs/day for 10 years    Types: Cigarettes  . Smokeless tobacco: Not on file  . Alcohol Use: No  . Drug Use: No  . Sexually Active: Not on file   Other Topics Concern  . Not on file   Social History Narrative   Retired. Regularly exercises.  Light;Treadmill at the [employer] training some, works in the yard. Patient does not have Living Will,does not have HCPOA. Married x 52 years, Happily. 1 son, 1 daughter has 6 grandchildren. Per Dr. Mariah Milling- No Salt Diet. Performs all ADL's, drives. Caffeine use: Tea. Carbonated beverages; occasionally. Smoke alarms and carbon monoxide detectors in the home. Guns in the home not stored in locked cabinet. Organ donor: Yes.    Family History  Problem Relation Age of Onset  . Cancer Other   . Coronary artery disease Other     brother  . Heart attack Mother   . Heart failure Father   .  Angina Father   . Emphysema Father   . Cancer Father     prostate  . Heart attack Brother   . Heart disease Maternal Aunt   . Lymphoma Sister     Non-Hodgkins  . Colon polyps Sister   . Anxiety disorder Brother     Objective:   Physical Exam  Nursing note and vitals reviewed. Constitutional: He is oriented to person, place, and time. He appears well-developed and well-nourished.  HENT:  Head: Normocephalic and atraumatic.  Eyes: Conjunctivae normal and EOM are normal. Pupils are equal, round, and reactive to light.  Neck: Normal range of motion. Neck supple. No JVD present. No thyromegaly present.  Cardiovascular: Normal rate, regular rhythm, normal heart sounds and intact distal pulses.   No murmur heard. Pulmonary/Chest: Effort normal and breath sounds normal.  Abdominal: Soft. Bowel sounds are normal. He exhibits no distension. There is no tenderness. There is no rebound and no guarding.  Neurological: He is alert and oriented to person, place, and time. No cranial nerve deficit. He exhibits normal muscle tone. Coordination normal.  Skin:       Well healing superficial laceration frontal-parietal region of scalp with good approximation; no erythema, drainage, induration.  Psychiatric: He has a normal mood and affect. His behavior is normal. Judgment and thought content normal.    INFLUENZA  VACCINE ADMINISTERED.     Assessment & Plan:   1. Type II or unspecified type diabetes mellitus without mention of complication, not stated as uncontrolled  POCT glycosylated hemoglobin (Hb A1C)  2. Essential hypertension, benign  Comprehensive metabolic panel  3. Other and unspecified hyperlipidemia  Lipid panel  4. Coronary atherosclerosis of unspecified type of vessel, native or graft  CBC with Differential, CK  5. Need for prophylactic vaccination and inoculation against influenza

## 2011-10-31 NOTE — Patient Instructions (Addendum)
1. Type II or unspecified type diabetes mellitus without mention of complication, not stated as uncontrolled  POCT glycosylated hemoglobin (Hb A1C)  2. Essential hypertension, benign  Comprehensive metabolic panel  3. Other and unspecified hyperlipidemia  Lipid panel  4. Coronary atherosclerosis of unspecified type of vessel, native or graft  CBC with Differential, CK  5. Need for prophylactic vaccination and inoculation against influenza

## 2011-11-01 LAB — COMPREHENSIVE METABOLIC PANEL
ALT: 20 U/L (ref 0–53)
Alkaline Phosphatase: 72 U/L (ref 39–117)
CO2: 25 mEq/L (ref 19–32)
Potassium: 4.3 mEq/L (ref 3.5–5.3)
Sodium: 138 mEq/L (ref 135–145)
Total Bilirubin: 0.4 mg/dL (ref 0.3–1.2)
Total Protein: 6.5 g/dL (ref 6.0–8.3)

## 2011-11-01 LAB — LIPID PANEL
LDL Cholesterol: 65 mg/dL (ref 0–99)
VLDL: 37 mg/dL (ref 0–40)

## 2011-11-07 ENCOUNTER — Telehealth: Payer: Self-pay

## 2011-11-07 NOTE — Telephone Encounter (Signed)
Pt states his pharmacy has requested  His meds and we have not responded??   Best phone [phone]

## 2011-11-07 NOTE — Telephone Encounter (Signed)
Spoke with patient who states that on 10/31/11 Dr. Katrinka Blazing rx'd Quinapril 10mg  for patient to take with his 20mg  tablet for a total of 30mg .  [facility] requires authorization for this from [place], and has faxed [place] this info twice per patient--and we haven't responded.  Will get [facility] number from patient so we can call them in the am.  [facility]: [phone]

## 2011-11-08 NOTE — Telephone Encounter (Signed)
Yes, OK to proceed with below.  He takes a 20mg  Quinapril and a 10mg  Quinapril.  Needs 90 day supply for both from [facility].  KMS

## 2011-11-08 NOTE — Telephone Encounter (Signed)
Called [facility] and straightened out rx so that they can go ahead and mail out Quinapril 10mg  for patient to use along with 20mg  that patient already has.  Patient aware.  Note to Dr. Katrinka Choi, FYI--is this plan ok with you?  I wasn't sure if you were done with your note yet.

## 2011-12-01 ENCOUNTER — Encounter: Payer: Self-pay | Admitting: Cardiovascular Disease

## 2011-12-01 ENCOUNTER — Ambulatory Visit (INDEPENDENT_AMBULATORY_CARE_PROVIDER_SITE_OTHER): Payer: Medicare Other | Admitting: Cardiovascular Disease

## 2011-12-01 VITALS — BP 140/64 | HR 70 | Ht 68.0 in | Wt 213.5 lb

## 2011-12-01 DIAGNOSIS — E785 Hyperlipidemia, unspecified: Secondary | ICD-10-CM

## 2011-12-01 DIAGNOSIS — I2510 Atherosclerotic heart disease of native coronary artery without angina pectoris: Secondary | ICD-10-CM

## 2011-12-01 DIAGNOSIS — E119 Type 2 diabetes mellitus without complications: Secondary | ICD-10-CM

## 2011-12-01 DIAGNOSIS — G459 Transient cerebral ischemic attack, unspecified: Secondary | ICD-10-CM

## 2011-12-01 DIAGNOSIS — I10 Essential (primary) hypertension: Secondary | ICD-10-CM

## 2011-12-01 MED ORDER — HYDRALAZINE HCL 50 MG PO TABS: 50.0000 mg | ORAL_TABLET | Freq: Three times a day (TID) | ORAL | Status: DC

## 2011-12-01 MED ORDER — FUROSEMIDE 20 MG PO TABS: 20.0000 mg | ORAL_TABLET | Freq: Two times a day (BID) | ORAL | Status: DC | PRN

## 2011-12-01 NOTE — Assessment & Plan Note (Addendum)
We have gone through his blood pressure numbers with him and talked about various treatment options. We have suggested he increase his hydralazine to 50 mg 3 times a day. We have also suggested he take extra Lasix for any edema. Recent lab work showed normal renal function. If his blood pressure continues to be elevated, we could add low-dose amlodipine. He has been using salt recently and his weight is climbing.

## 2011-12-01 NOTE — Progress Notes (Signed)
Patient ID: Kevin Choi, male    DOB: 04/19/1940, 71 y.o.   MRN: [id number]  HPI Comments: 71 year old gentleman with a history of coronary artery disease, bypass surgery with a LIMA to the LAD, vein graft to the diagonal, diabetes, hypertension, hyperlipidemia, history of pleural effusion requiring a VATS, carotid endarterectomy on the right who presents for routine followup. He has had TIA symptoms 3 times, once in 2002, October 2012 in March 2013.  He reports that on May 14 2011 he was doing well when he had acute onset of slurred speech. He was playing cards shortly after and he was unable to do any math. Symptoms lasted for approximately one to 2 hours. Blood pressure prior to this event had been well controlled though during and after the event, he had severe hypertension with systolic pressures sometimes greater than 200. He was in the emergency room for 2 days with extensive testing.  Previous TIA symptoms in October 2012.    carotid ultrasound that showed no hemodynamically significant stenosis Head CT scan showing no acute stroke MRI of the head showing no infarction. Stable appearing area of enhancement of the right temporal lobe most compatible with meningioma Chest x-ray was essentially normal  On a previous clinic visit, he was started on HCTZ with improvement of his blood pressure and followup. He does not report being on HCTZ or does not remember taking it on today's visit. His blood pressure has been running high on his current medication regimen was systolic pressures typically in the 150-160 range, occasionally 140.   Stress test April 2009 shows no significant ischemia. This was a pharmacologic study.  EKG shows normal sinus rhythm with rate 70 beats per minute with no significant ST or T wave changes, APCs, T wave abnormality in 1 and aVL    Outpatient Encounter Prescriptions as of 12/01/2011  Medication Sig Dispense Refill  . atorvastatin (LIPITOR) 40 MG tablet Take  40 mg by mouth daily.       . cetirizine (ZYRTEC) 10 MG tablet Take 10 mg by mouth daily.      . finasteride (PROSCAR) 5 MG tablet Take 5 mg by mouth daily.        . furosemide (LASIX) 20 MG tablet Take 1 tablet (20 mg total) by mouth 2 (two) times daily as needed.  180 tablet  3  . gabapentin (NEURONTIN) 100 MG capsule Take 200 mg by mouth 3 (three) times daily.       . hydrALAZINE (APRESOLINE) 50 MG tablet Take 1 tablet (50 mg total) by mouth 3 (three) times daily.  270 tablet  3  . magnesium gluconate (MAGONATE) 500 MG tablet Take 500 mg by mouth daily.      . metFORMIN (GLUMETZA) 500 MG (MOD) 24 hr tablet Take 500 mg by mouth 2 (two) times daily.        . metoprolol succinate (TOPROL-XL) 50 MG 24 hr tablet Take one and half tablet daily.  135 tablet  3  . Multiple Vitamins-Minerals (MULTIVITAL) tablet Take 1 tablet by mouth daily.        . nitroGLYCERIN (NITROSTAT) 0.4 MG SL tablet Place 0.4 mg under the tongue every 5 (five) minutes as needed.        . Omega-3 Fatty Acids (FISH OIL) 1000 MG CAPS Take by mouth 2 (two) times daily.        . pramipexole (MIRAPEX) 0.25 MG tablet Take 0.25 mg by mouth 3 (three) times daily.        [name]  quinapril (ACCUPRIL) 20 MG tablet Takes 1 and 1/2 tablet daily.      . Rivaroxaban (XARELTO) 20 MG TABS Take 20 mg by mouth daily.  90 tablet  3    Review of Systems  Constitutional: Negative.   HENT: Negative.   Eyes: Negative.   Respiratory: Negative.   Cardiovascular: Negative.   Gastrointestinal: Negative.   Musculoskeletal: Negative.   Skin: Negative.   Neurological: Negative.   Hematological: Negative.   Psychiatric/Behavioral: Negative.   All other systems reviewed and are negative.   BP 140/64  Pulse 70  Ht 5\' 8"  (1.727 m)  Wt 213 lb 8 oz (96.843 kg)  BMI 32.46 kg/m2  Physical Exam  Nursing note and vitals reviewed. Constitutional: He is oriented to person, place, and time. He appears well-developed and well-nourished.  HENT:  Head:  Normocephalic.  Nose: Nose normal.  Mouth/Throat: Oropharynx is clear and moist.  Eyes: Conjunctivae normal are normal. Pupils are equal, round, and reactive to light.  Neck: Normal range of motion. Neck supple. No JVD present.  Cardiovascular: Normal rate, regular rhythm, S1 normal, S2 normal, normal heart sounds and intact distal pulses.  Exam reveals no gallop and no friction rub.   No murmur heard.      Trace edema above the sock line, pitting  Pulmonary/Chest: Effort normal and breath sounds normal. No respiratory distress. He has no wheezes. He has no rales. He exhibits no tenderness.  Abdominal: Soft. Bowel sounds are normal. He exhibits no distension. There is no tenderness.  Musculoskeletal: Normal range of motion. He exhibits no edema and no tenderness.  Lymphadenopathy:    He has no cervical adenopathy.  Neurological: He is alert and oriented to person, place, and time. Coordination normal.  Skin: Skin is warm and dry. No rash noted. No erythema.  Psychiatric: He has a normal mood and affect. His behavior is normal. Judgment and thought content normal.           Assessment and Plan

## 2011-12-01 NOTE — Assessment & Plan Note (Signed)
Cholesterol is at goal on the current lipid regimen. No changes to the medications were made.  

## 2011-12-01 NOTE — Assessment & Plan Note (Signed)
Currently with no symptoms of angina. No further workup at this time. Continue current medication regimen. 

## 2011-12-01 NOTE — Patient Instructions (Addendum)
You are doing well. Please increase the hydralazine to 50 mg three times a day Take extra lasix as needed for edema or elevated blood pressure  Please call us if you have new issues that need to be addressed before your next appt.  Your physician wants you to follow-up in: 3 months.  You will receive a reminder letter in the mail two months in advance. If you don't receive a letter, please call our office to schedule the follow-up appointment.

## 2011-12-01 NOTE — Assessment & Plan Note (Signed)
We have suggested he stay on anticoagulation. Neurology had felt these were atypical migraines. He is less convinced.

## 2011-12-01 NOTE — Assessment & Plan Note (Signed)
We have encouraged continued exercise, careful diet management in an effort to lose weight. 

## 2011-12-15 DIAGNOSIS — B353 Tinea pedis: Secondary | ICD-10-CM | POA: Insufficient documentation

## 2011-12-15 DIAGNOSIS — S0101XA Laceration without foreign body of scalp, initial encounter: Secondary | ICD-10-CM | POA: Insufficient documentation

## 2011-12-15 DIAGNOSIS — Z23 Encounter for immunization: Secondary | ICD-10-CM | POA: Insufficient documentation

## 2011-12-15 DIAGNOSIS — G43109 Migraine with aura, not intractable, without status migrainosus: Secondary | ICD-10-CM | POA: Insufficient documentation

## 2011-12-15 NOTE — Assessment & Plan Note (Signed)
Moderately controlled; no change to medication at this time; obtain labs.

## 2011-12-15 NOTE — Assessment & Plan Note (Signed)
Controlled; obtain labs; continue current medications. 

## 2011-12-15 NOTE — Assessment & Plan Note (Signed)
S/p flu vaccine in office.

## 2011-12-15 NOTE — Assessment & Plan Note (Signed)
Improved with Ketoconazole cream.

## 2011-12-15 NOTE — Assessment & Plan Note (Signed)
New.  TDAP 2012; reassurance; local wound care reviewed during visit.  RTC for pain, drainage, redness.

## 2011-12-15 NOTE — Assessment & Plan Note (Signed)
Stable; no recurrent headaches since last visit; continue Magnesium supplementation.

## 2011-12-15 NOTE — Assessment & Plan Note (Signed)
Moderately controlled; obtain labs; continue current medications. 

## 2011-12-17 NOTE — Progress Notes (Signed)
Reviewed and agree.

## 2011-12-27 ENCOUNTER — Telehealth: Payer: Self-pay

## 2011-12-27 NOTE — Telephone Encounter (Signed)
Pt dropped off BP readings: 137/73 164/78 174/87 140/64 190/96 166/90 185/92 178/89 145/77 188/82 166/87 H=51-93bpm These readings are after hydralazine start

## 2011-12-27 NOTE — Telephone Encounter (Signed)
Options for blood pressure control include increasing hydralazine to 100 mg 3 times a day Or starting amlodipine 5 mg daily He may need both of these but would start one at a time

## 2011-12-28 ENCOUNTER — Other Ambulatory Visit: Payer: Self-pay

## 2011-12-28 MED ORDER — HYDRALAZINE HCL 100 MG PO TABS: 100.0000 mg | ORAL_TABLET | Freq: Three times a day (TID) | ORAL | Status: DC

## 2011-12-28 NOTE — Telephone Encounter (Signed)
Pt informed Understanding verb RX sent to pharm 

## 2012-01-05 ENCOUNTER — Telehealth: Payer: Self-pay | Admitting: Cardiovascular Disease

## 2012-01-05 NOTE — Telephone Encounter (Signed)
I have not heard of a contraindication to taking Celebrex and xarelto. May be more of a concern with warfarin, Less of a concern with other blood thinners

## 2012-01-05 NOTE — Telephone Encounter (Signed)
Ok for pt to take celebrex while on Xarelto?

## 2012-01-05 NOTE — Telephone Encounter (Signed)
Pt was told by his ortho dr that he could not take ibuprofen and the xarelto. Also was told fish oil is a blood thinner. Pt wants to discuss this with nurse. Was told he may be prescribed celebrex. Is this ok to take

## 2012-01-06 NOTE — Telephone Encounter (Signed)
Pt informed Understanding verb 

## 2012-01-30 ENCOUNTER — Ambulatory Visit: Payer: Medicare Other | Admitting: Family Medicine

## 2012-01-31 ENCOUNTER — Ambulatory Visit: Payer: Self-pay | Admitting: Family Medicine

## 2012-03-02 ENCOUNTER — Encounter: Payer: Self-pay | Admitting: Cardiovascular Disease

## 2012-03-02 ENCOUNTER — Ambulatory Visit (INDEPENDENT_AMBULATORY_CARE_PROVIDER_SITE_OTHER): Payer: Medicare Other | Admitting: Cardiovascular Disease

## 2012-03-02 VITALS — BP 110/58 | HR 81 | Ht 68.5 in | Wt 219.5 lb

## 2012-03-02 DIAGNOSIS — I10 Essential (primary) hypertension: Secondary | ICD-10-CM

## 2012-03-02 DIAGNOSIS — I2510 Atherosclerotic heart disease of native coronary artery without angina pectoris: Secondary | ICD-10-CM

## 2012-03-02 DIAGNOSIS — E785 Hyperlipidemia, unspecified: Secondary | ICD-10-CM

## 2012-03-02 DIAGNOSIS — E119 Type 2 diabetes mellitus without complications: Secondary | ICD-10-CM

## 2012-03-02 MED ORDER — FUROSEMIDE 20 MG PO TABS: 20.0000 mg | ORAL_TABLET | Freq: Two times a day (BID) | ORAL | Status: DC | PRN

## 2012-03-02 NOTE — Progress Notes (Signed)
Patient ID: Kevin Choi, male    DOB: [birth year], 72 y.o.   MRN: [id number]  HPI Comments: 72 year old gentleman with a history of coronary artery disease, bypass surgery with a LIMA to the LAD, vein graft to the diagonal, diabetes, hypertension, hyperlipidemia, history of pleural effusion requiring a VATS, carotid endarterectomy on the right who presents for routine followup. He has had TIA symptoms 3 times, once in 2002, October 2012 in March 2013.  Previous TIA symptoms in October 2012.   May 14 2011  he had acute onset of slurred speech. He was playing cards shortly after and he was unable to do any math. Symptoms lasted for approximately one to 2 hours. Blood pressure prior to this event had been well controlled though during and after the event, he had severe hypertension with systolic pressures sometimes greater than 200. He was in the emergency room for 2 days with extensive testing.  carotid ultrasound that showed no hemodynamically significant stenosis Head CT scan showing no acute stroke MRI of the head showing no infarction. Stable appearing area of enhancement of the right temporal lobe most compatible with meningioma Chest x-ray was essentially normal  Blood pressures at home have been elevated though has not been checking over the past several weeks. Blood pressures in December 2013 Were elevated with systolic pressures in the 150 range. Today blood pressures are well controlled. 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 He bends over and moves hands all day to help. This has hurt his back which has been flaring up here and there. Weight is up slightly as he has not been going to the gym but he has been active  Stress test April 2009 shows no significant ischemia. This was a pharmacologic study.  EKG shows normal sinus rhythm with rate 81 beats per minute with no significant ST or T wave changes,  T wave abnormality in 1 and  aVL    Outpatient Encounter Prescriptions as of 03/02/2012  Medication Sig Dispense Refill  . atorvastatin (LIPITOR) 40 MG tablet Take 40 mg by mouth daily.       . cetirizine (ZYRTEC) 10 MG tablet Take 10 mg by mouth as needed.       . finasteride (PROSCAR) 5 MG tablet Take 5 mg by mouth daily.        . furosemide (LASIX) 74 MG tablet Take 1 tablet (20 mg total) by mouth 2 (two) times daily as needed.  180 tablet  11  . gabapentin (NEURONTIN) 100 MG capsule Take 200 mg by mouth 3 (three) times daily.       . hydrALAZINE (APRESOLINE) 100 MG tablet Take 1 tablet (100 mg total) by mouth 3 (three) times daily.  270 tablet  3  . magnesium gluconate (MAGONATE) 500 MG tablet Take 500 mg by mouth daily.      . metFORMIN (GLUMETZA) 500 MG (MOD) 24 hr tablet Take 500 mg by mouth 2 (two) times daily.        . metoprolol succinate (TOPROL-XL) 50 MG 24 hr tablet Take one and half tablet daily.  135 tablet  3  . Multiple Vitamins-Minerals (MULTIVITAL) tablet Take 1 tablet by mouth daily.        . nitroGLYCERIN (NITROSTAT) 0.4 MG SL tablet Place 0.4 mg under the tongue every 5 (five) minutes as needed.        . Omega-3 Fatty Acids (FISH OIL) 1000 MG CAPS  Take by mouth 2 (two) times daily.        . pramipexole (MIRAPEX) 0.25 MG tablet Take 0.25 mg by mouth 3 (three) times daily.        . quinapril (ACCUPRIL) 20 MG tablet Takes 1 and 1/2 tablet daily.       . Rivaroxaban (XARELTO) 20 MG TABS Take 20 mg by mouth daily.  90 tablet  3   Review of Systems  Constitutional: Negative.   HENT: Negative.   Eyes: Negative.   Respiratory: Negative.   Cardiovascular: Negative.   Gastrointestinal: Negative.   Musculoskeletal: Positive for back pain.  Skin: Negative.   Neurological: Negative.   Hematological: Negative.   Psychiatric/Behavioral: Negative.   All other systems reviewed and are negative.   BP 110/58  Pulse 81  Ht 5' 8.5" (1.74 m)  Wt 219 lb 8 oz (99.565 kg)  BMI 32.89 kg/m2  Physical Exam    Nursing note and vitals reviewed. Constitutional: He is oriented to person, place, and time. He appears well-developed and well-nourished.  HENT:  Head: Normocephalic.  Nose: Nose normal.  Mouth/Throat: Oropharynx is clear and moist.  Eyes: Conjunctivae normal are normal. Pupils are equal, round, and reactive to light.  Neck: Normal range of motion. Neck supple. No JVD present.  Cardiovascular: Normal rate, regular rhythm, S1 normal, S2 normal, normal heart sounds and intact distal pulses.  Exam reveals no gallop and no friction rub.   No murmur heard.      Trace edema above the sock line, pitting  Pulmonary/Chest: Effort normal and breath sounds normal. No respiratory distress. He has no wheezes. He has no rales. He exhibits no tenderness.  Abdominal: Soft. Bowel sounds are normal. He exhibits no distension. There is no tenderness.  Musculoskeletal: Normal range of motion. He exhibits no edema and no tenderness.  Lymphadenopathy:    He has no cervical adenopathy.  Neurological: He is alert and oriented to person, place, and time. Coordination normal.  Skin: Skin is warm and dry. No rash noted. No erythema.  Psychiatric: He has a normal mood and affect. His behavior is normal. Judgment and thought content normal.           Assessment and Plan

## 2012-03-02 NOTE — Assessment & Plan Note (Signed)
We have encouraged continued exercise, careful diet management in an effort to lose weight. His weight continues to be a problem

## 2012-03-02 NOTE — Assessment & Plan Note (Signed)
Currently with no symptoms of angina. No further workup at this time. Continue current medication regimen. 

## 2012-03-02 NOTE — Patient Instructions (Addendum)
You are doing well. No medication changes were made.  Please call us if you have new issues that need to be addressed before your next appt.  Your physician wants you to follow-up in: 6 months.  You will receive a reminder letter in the mail two months in advance. If you don't receive a letter, please call our office to schedule the follow-up appointment.   

## 2012-03-02 NOTE — Assessment & Plan Note (Signed)
Cholesterol is at goal on the current lipid regimen. No changes to the medications were made.  

## 2012-03-02 NOTE — Assessment & Plan Note (Signed)
Blood pressure is high on his measurements at home but was well controlled today even when I rechecked it. Systolic pressure 115. Difficult to add additional medications with his blood pressure looking so good today. I suggested he continue to monitor his blood pressure. Wonder if he needs to have his blood pressure calibrated with our manual measurements.

## 2012-03-13 ENCOUNTER — Encounter: Payer: Self-pay | Admitting: Family Medicine

## 2012-03-13 ENCOUNTER — Ambulatory Visit (INDEPENDENT_AMBULATORY_CARE_PROVIDER_SITE_OTHER): Payer: Medicare Other | Admitting: Family Medicine

## 2012-03-13 VITALS — BP 115/60 | HR 63 | Temp 99.2°F | Resp 16 | Ht 68.5 in | Wt 215.0 lb

## 2012-03-13 DIAGNOSIS — E785 Hyperlipidemia, unspecified: Secondary | ICD-10-CM

## 2012-03-13 DIAGNOSIS — I10 Essential (primary) hypertension: Secondary | ICD-10-CM

## 2012-03-13 DIAGNOSIS — E119 Type 2 diabetes mellitus without complications: Secondary | ICD-10-CM

## 2012-03-13 DIAGNOSIS — G43909 Migraine, unspecified, not intractable, without status migrainosus: Secondary | ICD-10-CM

## 2012-03-13 DIAGNOSIS — R42 Dizziness and giddiness: Secondary | ICD-10-CM | POA: Insufficient documentation

## 2012-03-13 DIAGNOSIS — E7800 Pure hypercholesterolemia, unspecified: Secondary | ICD-10-CM

## 2012-03-13 LAB — CBC WITH DIFFERENTIAL/PLATELET
Basophils Relative: 0 % (ref 0–1)
Eosinophils Absolute: 0.2 10*3/uL (ref 0.0–0.7)
Eosinophils Relative: 2 % (ref 0–5)
HCT: 42.1 % (ref 39.0–52.0)
Hemoglobin: 14.5 g/dL (ref 13.0–17.0)
MCH: 29.1 pg (ref 26.0–34.0)
MCHC: 34.4 g/dL (ref 30.0–36.0)
Monocytes Absolute: 1 10*3/uL (ref 0.1–1.0)
Monocytes Relative: 12 % (ref 3–12)

## 2012-03-13 LAB — LIPID PANEL
Cholesterol: 139 mg/dL (ref 0–200)
Total CHOL/HDL Ratio: 4 Ratio
Triglycerides: 181 mg/dL — ABNORMAL HIGH (ref <150)
VLDL: 36 mg/dL (ref 0–40)

## 2012-03-13 LAB — COMPREHENSIVE METABOLIC PANEL
CO2: 26 mEq/L (ref 19–32)
Creat: 0.96 mg/dL (ref 0.50–1.35)
Glucose, Bld: 131 mg/dL — ABNORMAL HIGH (ref 70–99)
Total Bilirubin: 0.6 mg/dL (ref 0.3–1.2)

## 2012-03-13 LAB — HEMOGLOBIN A1C
Hgb A1c MFr Bld: 6.9 % — ABNORMAL HIGH (ref <5.7)
Mean Plasma Glucose: 151 mg/dL — ABNORMAL HIGH (ref <117)

## 2012-03-13 MED ORDER — METOPROLOL SUCCINATE ER 50 MG PO TB24: ORAL_TABLET | ORAL | Status: DC

## 2012-03-13 MED ORDER — ATORVASTATIN CALCIUM 80 MG PO TABS: 80.0000 mg | ORAL_TABLET | Freq: Every day | ORAL | Status: DC

## 2012-03-13 MED ORDER — GABAPENTIN 100 MG PO CAPS: 200.0000 mg | ORAL_CAPSULE | Freq: Four times a day (QID) | ORAL | Status: DC

## 2012-03-13 MED ORDER — PRAMIPEXOLE DIHYDROCHLORIDE 0.25 MG PO TABS: 0.2500 mg | ORAL_TABLET | Freq: Three times a day (TID) | ORAL | Status: DC

## 2012-03-13 MED ORDER — FINASTERIDE 5 MG PO TABS: 5.0000 mg | ORAL_TABLET | Freq: Every day | ORAL | Status: DC

## 2012-03-13 MED ORDER — HYDRALAZINE HCL 100 MG PO TABS: 100.0000 mg | ORAL_TABLET | Freq: Three times a day (TID) | ORAL | Status: DC

## 2012-03-13 MED ORDER — RIVAROXABAN 20 MG PO TABS: 20.0000 mg | ORAL_TABLET | Freq: Every day | ORAL | Status: DC

## 2012-03-13 MED ORDER — METFORMIN HCL ER (MOD) 500 MG PO TB24: 500.0000 mg | ORAL_TABLET | Freq: Two times a day (BID) | ORAL | Status: DC

## 2012-03-13 MED ORDER — QUINAPRIL HCL 20 MG PO TABS: 30.0000 mg | ORAL_TABLET | Freq: Every day | ORAL | Status: DC

## 2012-03-13 NOTE — Assessment & Plan Note (Signed)
New.  Single episode 60 seconds duration; normal neurological exam in office; no associated cardiac or neurological symptoms; blood sugar slightly elevated at 160 at time of event.  Contact office for recurrence.

## 2012-03-13 NOTE — Patient Instructions (Addendum)
1. Type II or unspecified type diabetes mellitus without mention of complication, not stated as uncontrolled  Hemoglobin A1c  2. Pure hypercholesterolemia  CBC with Differential, CK, Lipid panel  3. Essential hypertension, benign  Comprehensive metabolic panel

## 2012-03-13 NOTE — Assessment & Plan Note (Signed)
Controlled in two recent office visits; no change to management at this time.  Obtain labs.  Refills provided.

## 2012-03-13 NOTE — Assessment & Plan Note (Signed)
Controlled; no recurrent headaches or focal neurological symptoms in past three months; appointment with Sherryll Burger in 03/2012.  To purchase more Magnesium and encouraged to do so.

## 2012-03-13 NOTE — Progress Notes (Signed)
[address]   [place], [place]  [address]   [phone]  Subjective:    Patient ID: Kevin Choi, male    DOB: 1940-11-07, 72 y.o.   MRN: [id number]  HPIThis 72 y.o. male presents for evaluation of the following:  1.  Dizziness:  Sitting at computer the other day with acute onset of dizziness, lightheaded sensation.  Had to be late afternoon or early evening.  Sat there with spontaneous resolution.  Checked blood sugar 162 and has remained elevated since then.  Did go down to 139; this occurred two days ago.  This morning 139 this morning.  Did not check blood pressure with episode.  No headache; no vision changes; new tinnitus; no hearing loss; no slurred speech; no paresthesias.  Duration of 60 seconds.  No recurrence.  No chest pain, palpitations, SOB, diaphoresis.    2.  DMII:  Three month follow-up; no changes to management made at last visit.  Last HgbA1c of 6.0.  Reports compliance with medication; good tolerance to medication; good symptom control.  Blood sugar running higher than baseline for past week 130-160.  Needs test strips.  No formal exercise.  Denies polyuria, polydipsia, significant weight changes.  3.  HTN: home BP readings range 115/65-162/81. Pulse ranges 55-97.  Took medication 6:45am.   S/p recent follow-up with cardiology for CAD; blood pressure 110/58 in office.  No changes to management made.  Denies CP/palp/SOB/leg swelling; denies HA/focal weakness/new paresthesias/focal weakness.  4. Hyperlipidemia:  Three month follow-up; fasting today; no changes to management made at last visit. Reports good compliance with medication; good tolerance to medication; good symptom control.    5.  HA: no recurrent severe headaches since last visit three months ago.  Follow-up with Clelia Croft in 03/2012.  Taking Magnesium supplement; ran out one month ago.  No focal neurological symptoms.  6.  Needs refill on all medications except not for furosemide or Celebrex.  7.  Alzheimer's study:  participating.  Double blinded study.  Volunteering twice weekly.  Taking either placebo or Actos 15mg  1/2 daily.   Review of Systems  Constitutional: Negative for fever, chills, diaphoresis, appetite change and fatigue.  Respiratory: Negative for shortness of breath, wheezing and stridor.   Cardiovascular: Negative for chest pain, palpitations and leg swelling.  Gastrointestinal: Negative for nausea, vomiting and diarrhea.  Skin: Negative for color change, pallor, rash and wound.  Neurological: Positive for dizziness, light-headedness and numbness. Negative for tremors, syncope, facial asymmetry, speech difficulty, weakness and headaches.       Past Medical History  Diagnosis Date  . CAD (coronary artery disease) 2002    bypass  . Diabetes mellitus   . HTN (hypertension) 05/08/2007  . HLD (hyperlipidemia)   . Pleural effusion 2007    with pneumothorax   . TIA (transient ischemic attack)     Patient stated 2 months ago  . Peripheral neuropathy 01/31/2008  . Peripheral vascular disease   . Headache   . Slurred speech   . Nausea with vomiting   . Hyperhidrosis   . Leg pain   . Personal history of urinary calculi   . Hemiplegic migraine, without mention of intractable migraine without mention of status migrainosus   . Elevated prostate specific antigen (PSA) 02/21/2001  . Allergic rhinitis due to pollen   . Occlusion and stenosis of carotid artery without mention of cerebral infarction 09/15/2006  . Restless legs syndrome (RLS) 01/10/2007  . Impotence of organic origin 07/24/2007  . Chicken pox   .  Measles   . Mumps     Past Surgical History  Procedure Date  . Coronary artery bypass graft 2001  . Carotid endarterectomy 2001    right  . Collapse lung 2007  . Parotid gland tumor excision 1973  . Cholecystectomy 2003  . Lung surgery 2007    Left; decortication left lung for persistant pleural effusion and PTX. admission ten days at [facility].    Prior to Admission  medications   Medication Sig Start Date End Date Taking? Authorizing Provider  atorvastatin (LIPITOR) 80 MG tablet Take 1 tablet (80 mg total) by mouth daily. 03/13/12  Yes Ethelda Chick, MD  celecoxib (CELEBREX) 200 MG capsule Take 200 mg by mouth 2 (two) times daily.   Yes Historical Provider, MD  cetirizine (ZYRTEC) 10 MG tablet Take 10 mg by mouth as needed.    Yes Historical Provider, MD  finasteride (PROSCAR) 5 MG tablet Take 1 tablet (5 mg total) by mouth daily. 03/13/12  Yes Ethelda Chick, MD  furosemide (LASIX) 20 MG tablet Take 1 tablet (20 mg total) by mouth 2 (two) times daily as needed. 03/02/12  Yes Antonieta Iba, MD  gabapentin (NEURONTIN) 100 MG capsule Take 2 capsules (200 mg total) by mouth 4 (four) times daily. 03/13/12  Yes Ethelda Chick, MD  hydrALAZINE (APRESOLINE) 100 MG tablet Take 1 tablet (100 mg total) by mouth 3 (three) times daily. 03/13/12  Yes Ethelda Chick, MD  magnesium gluconate (MAGONATE) 500 MG tablet Take 500 mg by mouth daily.   Yes Historical Provider, MD  metFORMIN (GLUMETZA) 500 MG (MOD) 24 hr tablet Take 1 tablet (500 mg total) by mouth 2 (two) times daily. 03/13/12  Yes Ethelda Chick, MD  metoprolol succinate (TOPROL-XL) 50 MG 24 hr tablet Take one and half tablet daily. 03/13/12  Yes Ethelda Chick, MD  Multiple Vitamins-Minerals (MULTIVITAL) tablet Take 1 tablet by mouth daily.     Yes Historical Provider, MD  nitroGLYCERIN (NITROSTAT) 0.4 MG SL tablet Place 0.4 mg under the tongue every 5 (five) minutes as needed.     Yes Historical Provider, MD  Omega-3 Fatty Acids (FISH OIL) 1000 MG CAPS Take by mouth 2 (two) times daily.     Yes Historical Provider, MD  pramipexole (MIRAPEX) 0.25 MG tablet Take 1 tablet (0.25 mg total) by mouth 3 (three) times daily. 03/13/12  Yes Ethelda Chick, MD  quinapril (ACCUPRIL) 20 MG tablet Take 1.5 tablets (30 mg total) by mouth daily. Takes 1 and 1/2 tablet daily. 03/13/12  Yes Ethelda Chick, MD  Rivaroxaban (XARELTO) 20  MG TABS Take 1 tablet (20 mg total) by mouth daily. 03/13/12  Yes Ethelda Chick, MD    Allergies  Allergen Reactions  . Other Nausea Only    Contrast Dye-drops blood pressure.    History   Social History  . Marital Status: Married    Spouse Name: N/A    Number of Children: 2  . Years of Education: college   Occupational History  . retired [employer] 10/2008  .      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 Smoking status: Former Smoker -- 2.0 packs/day for 10 years    Types: Cigarettes  . Smokeless tobacco: Not on file  . Alcohol Use: No  . Drug Use: No  . Sexually Active: Not on file   Other Topics Concern  . Not on file  Social History Narrative   Retired. Regularly exercises. Light;Treadmill at the [employer] training some, works in the yard. Patient does not have Living Will,does not have HCPOA. Married x 52 years, Happily. 1 son, 1 daughter has 6 grandchildren. Per Dr. Mariah Milling- No Salt Diet. Performs all ADL's, drives. Caffeine use: Tea. Carbonated beverages; occasionally. Smoke alarms and carbon monoxide detectors in the home. Guns in the home not stored in locked cabinet. Organ donor: Yes.    Family History  Problem Relation Age of Onset  . Cancer Other   . Coronary artery disease Other     brother  . Heart attack Mother   . Heart failure Father   . Angina Father   . Emphysema Father   . Cancer Father     prostate  . Heart attack Brother   . Heart disease Maternal Aunt   . Lymphoma Sister     Non-Hodgkins  . Colon polyps Sister   . Anxiety disorder Brother     Objective:   Physical Exam  Nursing note and vitals reviewed. Constitutional: He is oriented to person, place, and time. He appears well-developed and well-nourished. No distress.  Eyes: Conjunctivae normal are normal. Pupils are equal, round, and reactive to light.  Neck: Normal range of motion. Neck supple. No JVD present. No thyromegaly present.    Cardiovascular: Normal rate, regular rhythm and normal heart sounds.   Pulmonary/Chest: Effort normal and breath sounds normal. He has no wheezes. He has no rales.  Lymphadenopathy:    He has no cervical adenopathy.  Neurological: He is alert and oriented to person, place, and time. No cranial nerve deficit. He exhibits normal muscle tone. Coordination normal.  Skin: Skin is warm and dry. No rash noted. He is not diaphoretic.  Psychiatric: He has a normal mood and affect. His behavior is normal. Judgment normal.       Assessment & Plan:   1. Type II or unspecified type diabetes mellitus without mention of complication, not stated as uncontrolled  Hemoglobin A1c  2. Pure hypercholesterolemia  CBC with Differential, CK, Lipid panel  3. Essential hypertension, benign  Comprehensive metabolic panel  4. Dizziness

## 2012-03-13 NOTE — Assessment & Plan Note (Signed)
Controlled; obtain labs; continue current medications; refills provided. 

## 2012-03-13 NOTE — Assessment & Plan Note (Signed)
Controlled with recent worsening of fasting sugars; continue to check sugars daily; refills provided; obtain labs; no changes to medication today.  Refill of test strips provided.

## 2012-03-21 ENCOUNTER — Other Ambulatory Visit: Payer: Self-pay | Admitting: Family Medicine

## 2012-03-21 NOTE — Telephone Encounter (Signed)
Patient called because the medication that was sent into mail order pharmacy was the wrong one. The one that was sent in was [facility] and that is a tier 4 medication. He has been on Metformin 500 mg bid. Wants that one sent to mid [facility] pharmacy. When I looked under his med list I see that one is on there and it says Metformin with Glumetza as the brand name. Not sure if this was just entered incorrectly because they just seen Metformin.

## 2012-03-22 MED ORDER — METFORMIN HCL 500 MG PO TABS: 500.0000 mg | ORAL_TABLET | Freq: Two times a day (BID) | ORAL | Status: DC

## 2012-03-22 NOTE — Telephone Encounter (Signed)
90 day supply sent in for him to Mail order. Will you please see me about this one? Amy

## 2012-03-22 NOTE — Telephone Encounter (Signed)
Please call pt --- Please apologize for error.  It was definitely an input error; we did not mean to put in Brand name Metformin.  I have sent generic Metformin to [facility].  Does he need a rx sent to [facility] for generic Metformin?

## 2012-06-18 ENCOUNTER — Encounter: Payer: Self-pay | Admitting: Family Medicine

## 2012-06-18 ENCOUNTER — Ambulatory Visit (INDEPENDENT_AMBULATORY_CARE_PROVIDER_SITE_OTHER): Payer: Medicare Other | Admitting: Family Medicine

## 2012-06-18 VITALS — BP 120/70 | HR 63 | Temp 98.0°F | Resp 16 | Ht 68.0 in | Wt 215.0 lb

## 2012-06-18 DIAGNOSIS — G43909 Migraine, unspecified, not intractable, without status migrainosus: Secondary | ICD-10-CM

## 2012-06-18 DIAGNOSIS — G4710 Hypersomnia, unspecified: Secondary | ICD-10-CM | POA: Insufficient documentation

## 2012-06-18 DIAGNOSIS — I10 Essential (primary) hypertension: Secondary | ICD-10-CM

## 2012-06-18 DIAGNOSIS — E119 Type 2 diabetes mellitus without complications: Secondary | ICD-10-CM

## 2012-06-18 DIAGNOSIS — E7800 Pure hypercholesterolemia, unspecified: Secondary | ICD-10-CM

## 2012-06-18 DIAGNOSIS — E785 Hyperlipidemia, unspecified: Secondary | ICD-10-CM

## 2012-06-18 LAB — CBC WITH DIFFERENTIAL/PLATELET
HCT: 40 % (ref 39.0–52.0)
Hemoglobin: 13.8 g/dL (ref 13.0–17.0)
Lymphs Abs: 2 10*3/uL (ref 0.7–4.0)
MCH: 29 pg (ref 26.0–34.0)
MCHC: 34.5 g/dL (ref 30.0–36.0)
Monocytes Absolute: 0.7 10*3/uL (ref 0.1–1.0)
Monocytes Relative: 9 % (ref 3–12)
Neutro Abs: 4.8 10*3/uL (ref 1.7–7.7)
Neutrophils Relative %: 63 % (ref 43–77)
RBC: 4.76 MIL/uL (ref 4.22–5.81)

## 2012-06-18 LAB — HEMOGLOBIN A1C
Hgb A1c MFr Bld: 6.4 % — ABNORMAL HIGH (ref <5.7)
Mean Plasma Glucose: 137 mg/dL — ABNORMAL HIGH (ref <117)

## 2012-06-18 LAB — COMPREHENSIVE METABOLIC PANEL
ALT: 25 U/L (ref 0–53)
Albumin: 4.2 g/dL (ref 3.5–5.2)
CO2: 24 mEq/L (ref 19–32)
Calcium: 9 mg/dL (ref 8.4–10.5)
Chloride: 104 mEq/L (ref 96–112)
Glucose, Bld: 111 mg/dL — ABNORMAL HIGH (ref 70–99)
Sodium: 138 mEq/L (ref 135–145)
Total Protein: 6.4 g/dL (ref 6.0–8.3)

## 2012-06-18 LAB — CK: Total CK: 120 U/L (ref 7–232)

## 2012-06-18 LAB — LIPID PANEL: LDL Cholesterol: 62 mg/dL (ref 0–99)

## 2012-06-18 MED ORDER — METFORMIN HCL 500 MG PO TABS: 500.0000 mg | ORAL_TABLET | Freq: Two times a day (BID) | ORAL | Status: DC

## 2012-06-18 NOTE — Assessment & Plan Note (Signed)
Controlled; obtain labs; continue current medications. 

## 2012-06-18 NOTE — Assessment & Plan Note (Signed)
New.  Ddx medication side effect (Gabapentin, Mirapex) versus OSA.  Recommend decreasing daily Gabapentin dose from 200mg  to 100mg  and increase bedtime dose to 300mg  qhs.  If persistent hypersomnia, refer for repeat sleep study.

## 2012-06-18 NOTE — Progress Notes (Signed)
[address]   [place], [place]  [address]   [phone]  Subjective:    Patient ID: Kevin Choi, male    DOB: Jan 24, 1941, 72 y.o.   MRN: [id number]  HPI This 72 y.o. male presents for three month follow-up of the following:  1.  UXL:KGMWN month follow-up; no changes made to management at last visit.  Home BP readings ranging 120-159/61-79 with pulse of 52-83.  Reports compliance with medication; good tolerance to medication; good symptom control.  2.  DMII:  Three month follow-up; no changes to management made at last visit; last HgbA1c of 6.9 on 03/13/12.  Reports good compliance with medication; good tolerance to medication; good symptom control.   3. Hyperlipidemia:  Three month follow-up; no changes to management made at last visit; reports good tolerance to medication, good compliance; good symptom control.  4.  Complex Migraines:  No recurrent headaches with focal neurological symptoms since last visit.  S/p follow-up with Dr. Clelia Croft of neurology who recommended to follow-up PRN.  Feeling well.  5.  BPH/abnormal PSA:  Followed by Dr. Achilles Dunk every six months.    6. Hypersomnolence: onset in past two years; occurs while sitting in mornings or afternoons;  S/p three sleep studies in the past; last sleep study per neurologist with myoclonus in 2003.   Weight in past ten years has gone from 197 to 210; has gained 13-15 pounds.  Worried about medication side effect; takes Neurontin for PN; takes Mirapex for myoclonus.      Review of Systems  Constitutional: Negative for chills, diaphoresis and fatigue.  Respiratory: Negative for cough, shortness of breath, wheezing and stridor.   Cardiovascular: Positive for leg swelling. Negative for chest pain and palpitations.  Endocrine: Negative for cold intolerance, heat intolerance, polydipsia, polyphagia and polyuria.  Skin: Negative for color change, pallor, rash and wound.  Neurological: Positive for numbness. Negative for dizziness, tremors,  seizures, syncope, facial asymmetry, speech difficulty, weakness, light-headedness and headaches.   Past Medical History  Diagnosis Date  . CAD (coronary artery disease) 2002    bypass  . Diabetes mellitus   . HTN (hypertension) 05/08/2007  . HLD (hyperlipidemia)   . Pleural effusion 2007    with pneumothorax   . TIA (transient ischemic attack)     Patient stated 2 months ago  . Peripheral vascular disease   . Headache   . Slurred speech   . Nausea with vomiting   . Hyperhidrosis   . Leg pain   . Personal history of urinary calculi   . Hemiplegic migraine, without mention of intractable migraine without mention of status migrainosus   . Elevated prostate specific antigen (PSA) 02/21/2001  . Allergic rhinitis due to pollen   . Occlusion and stenosis of carotid artery without mention of cerebral infarction 09/15/2006  . Restless legs syndrome (RLS) 01/10/2007  . Impotence of organic origin 07/24/2007  . Chicken pox   . Measles   . Mumps   . Peripheral neuropathy 01/31/2008   Current Outpatient Prescriptions on File Prior to Visit  Medication Sig Dispense Refill  . atorvastatin (LIPITOR) 80 MG tablet Take 1 tablet (80 mg total) by mouth daily.  90 tablet  1  . celecoxib (CELEBREX) 200 MG capsule Take 200 mg by mouth 2 (two) times daily.      . cetirizine (ZYRTEC) 10 MG tablet Take 10 mg by mouth as needed.       . finasteride (PROSCAR) 5 MG tablet Take 1 tablet (5  mg total) by mouth daily.  90 tablet  3  . furosemide (LASIX) 20 MG tablet Take 1 tablet (20 mg total) by mouth 2 (two) times daily as needed.  180 tablet  11  . gabapentin (NEURONTIN) 100 MG capsule Take 2 capsules (200 mg total) by mouth 4 (four) times daily.  720 capsule  2  . hydrALAZINE (APRESOLINE) 100 MG tablet Take 1 tablet (100 mg total) by mouth 3 (three) times daily.  270 tablet  3  . magnesium gluconate (MAGONATE) 500 MG tablet Take 500 mg by mouth daily.      . metoprolol succinate (TOPROL-XL) 50 MG 24 hr  tablet Take one and half tablet daily.  135 tablet  3  . Multiple Vitamins-Minerals (MULTIVITAL) tablet Take 1 tablet by mouth daily.        . nitroGLYCERIN (NITROSTAT) 0.4 MG SL tablet Place 0.4 mg under the tongue every 5 (five) minutes as needed.        . pramipexole (MIRAPEX) 0.25 MG tablet Take 1 tablet (0.25 mg total) by mouth 3 (three) times daily.  270 tablet  3  . quinapril (ACCUPRIL) 20 MG tablet Take 1.5 tablets (30 mg total) by mouth daily. Takes 1 and 1/2 tablet daily.  135 tablet  3  . Rivaroxaban (XARELTO) 20 MG TABS Take 1 tablet (20 mg total) by mouth daily.  90 tablet  3  . Omega-3 Fatty Acids (FISH OIL) 1000 MG CAPS Take by mouth 2 (two) times daily.         No current facility-administered medications on file prior to visit.      History   Social History  . Marital Status: Married    Spouse Name: N/A    Number of Children: 2  . Years of Education: college   Occupational History  . retired [employer] 10/2008  .      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 Smoking status: Former Smoker -- 2.00 packs/day for 10 years    Types: Cigarettes  . Smokeless tobacco: Not on file  . Alcohol Use: No  . Drug Use: No  . Sexually Active: Not Currently   Other Topics Concern  . Not on file   Social History Narrative   Retired. Regularly exercises. Light;Treadmill at the [employer] training some, works in the yard. Patient does not have Living Will,does not have HCPOA. Married x 52 years, Happily. 1 son, 1 daughter has 6 grandchildren. Per Dr. Mariah Milling- No Salt Diet. Performs all ADL's, drives. Caffeine use: Tea. Carbonated beverages; occasionally. Smoke alarms and carbon monoxide detectors in the home. Guns in the home not stored in locked cabinet. Organ donor: Yes.   Objective:   Physical Exam  Nursing note and vitals reviewed. Constitutional: He is oriented to person, place, and time. He appears well-developed and well-nourished. No  distress.  HENT:  Head: Normocephalic and atraumatic.  Mouth/Throat: Oropharynx is clear and moist.  Eyes: Conjunctivae and EOM are normal. Pupils are equal, round, and reactive to light.  Neck: Normal range of motion. Neck supple. No JVD present. No thyromegaly present.  Cardiovascular: Normal rate, regular rhythm and intact distal pulses.  Exam reveals no gallop and no friction rub.   No murmur heard. Pulmonary/Chest: Effort normal and breath sounds normal. He has no wheezes. He has no rales.  Lymphadenopathy:    He has no cervical adenopathy.  Neurological: He is alert and oriented to person, place, and  time. No cranial nerve deficit. He exhibits normal muscle tone. Coordination normal.  Skin: Skin is warm and dry. No rash noted. He is not diaphoretic. No erythema. No pallor.  Psychiatric: He has a normal mood and affect. His behavior is normal.       Assessment & Plan:

## 2012-06-18 NOTE — Assessment & Plan Note (Signed)
Moderately controlled; no change in medications; obtain labs.

## 2012-06-18 NOTE — Assessment & Plan Note (Signed)
Controlled; obtain labs; continue current medication; rx refilled.

## 2012-06-18 NOTE — Assessment & Plan Note (Signed)
Improved; s/p recent follow-up with Dr. Clelia Croft; PRN follow-up recommended.

## 2012-06-21 ENCOUNTER — Telehealth: Payer: Self-pay

## 2012-06-21 NOTE — Telephone Encounter (Signed)
Patient of Dr. Katrinka Blazing. States he mentioned to her about him and his wife taking a month long vacation and he wants to know if he can get a RX for diarrhea (diphenoxylate-atropine) and for allergic reaction (tromethazine). He has a bee allergy. States these RX's were written a while back (2008?). Uses [facility] in [place]. CB # [id number].

## 2012-06-22 ENCOUNTER — Telehealth: Payer: Self-pay | Admitting: *Deleted

## 2012-06-22 MED ORDER — QUINAPRIL HCL 20 MG PO TABS: 30.0000 mg | ORAL_TABLET | Freq: Every day | ORAL | Status: DC

## 2012-06-22 NOTE — Telephone Encounter (Signed)
He is to be taking: Quinapril 20mg    1.5 tablets daily.  I escribed medication into [facility].

## 2012-06-22 NOTE — Telephone Encounter (Signed)
[facility] had gotten a request from the patient requesting quinapril 10mg  qd and quinapril 20mg  1and 1/2 daily.  Which one would you like for patient to be on?

## 2012-06-23 ENCOUNTER — Encounter: Payer: Self-pay | Admitting: Family Medicine

## 2012-06-24 ENCOUNTER — Telehealth: Payer: Self-pay

## 2012-06-24 MED ORDER — DIPHENOXYLATE-ATROPINE 2.5-0.025 MG PO TABS: 1.0000 | ORAL_TABLET | Freq: Four times a day (QID) | ORAL | Status: DC | PRN

## 2012-06-24 NOTE — Telephone Encounter (Signed)
FYI: Pt notified of labs and copy mailed to him

## 2012-06-24 NOTE — Telephone Encounter (Signed)
Sent in rx for Lomotil (diarrhea medication) to [facility] in [place]. Please call pt to clarify second medication that he is requesting; I am not familiar with his request.

## 2012-06-25 ENCOUNTER — Telehealth: Payer: Self-pay | Admitting: Radiology

## 2012-06-25 NOTE — Telephone Encounter (Signed)
He is asking about meds from mail order for his Wife.

## 2012-06-25 NOTE — Telephone Encounter (Signed)
Patient states he is requesting phenergan. Please advise.

## 2012-06-26 MED ORDER — PROMETHAZINE HCL 12.5 MG PO TABS: 12.5000 mg | ORAL_TABLET | Freq: Three times a day (TID) | ORAL | Status: DC | PRN

## 2012-06-26 NOTE — Telephone Encounter (Signed)
Sent rx for Promethazine/Phenergan to [facility] in [place].

## 2012-06-27 LAB — HM DIABETES EYE EXAM

## 2012-07-12 ENCOUNTER — Other Ambulatory Visit: Payer: Self-pay | Admitting: Radiology

## 2012-07-12 ENCOUNTER — Other Ambulatory Visit: Payer: Self-pay | Admitting: Family Medicine

## 2012-07-12 MED ORDER — ACCU-CHEK SOFTCLIX LANCETS MISC: Status: DC

## 2012-07-12 MED ORDER — GLUCOSE BLOOD VI STRP: ORAL_STRIP | Status: DC

## 2012-07-12 NOTE — Telephone Encounter (Signed)
Sent in supplies.

## 2012-07-12 NOTE — Telephone Encounter (Signed)
Patient is requesting glucose strips to [facility] on [address] today.   [phone] (H)

## 2012-07-18 ENCOUNTER — Other Ambulatory Visit: Payer: Self-pay

## 2012-07-18 ENCOUNTER — Encounter: Payer: Self-pay | Admitting: Family Medicine

## 2012-07-18 MED ORDER — GLUCOSE BLOOD VI STRP: ORAL_STRIP | Status: DC

## 2012-10-09 ENCOUNTER — Ambulatory Visit (INDEPENDENT_AMBULATORY_CARE_PROVIDER_SITE_OTHER): Payer: Medicare Other | Admitting: Family Medicine

## 2012-10-09 ENCOUNTER — Telehealth: Payer: Self-pay

## 2012-10-09 ENCOUNTER — Encounter: Payer: Self-pay | Admitting: Family Medicine

## 2012-10-09 VITALS — BP 119/66 | HR 58 | Temp 97.8°F | Resp 16 | Ht 68.5 in | Wt 215.0 lb

## 2012-10-09 DIAGNOSIS — E785 Hyperlipidemia, unspecified: Secondary | ICD-10-CM

## 2012-10-09 DIAGNOSIS — E1142 Type 2 diabetes mellitus with diabetic polyneuropathy: Secondary | ICD-10-CM

## 2012-10-09 DIAGNOSIS — Z0000 Encounter for general adult medical examination without abnormal findings: Secondary | ICD-10-CM

## 2012-10-09 DIAGNOSIS — I10 Essential (primary) hypertension: Secondary | ICD-10-CM

## 2012-10-09 DIAGNOSIS — E7800 Pure hypercholesterolemia, unspecified: Secondary | ICD-10-CM

## 2012-10-09 DIAGNOSIS — E119 Type 2 diabetes mellitus without complications: Secondary | ICD-10-CM

## 2012-10-09 DIAGNOSIS — I2510 Atherosclerotic heart disease of native coronary artery without angina pectoris: Secondary | ICD-10-CM

## 2012-10-09 DIAGNOSIS — G4710 Hypersomnia, unspecified: Secondary | ICD-10-CM

## 2012-10-09 DIAGNOSIS — E782 Mixed hyperlipidemia: Secondary | ICD-10-CM

## 2012-10-09 LAB — COMPREHENSIVE METABOLIC PANEL
ALT: 29 U/L (ref 0–53)
Albumin: 4.1 g/dL (ref 3.5–5.2)
CO2: 27 mEq/L (ref 19–32)
Potassium: 4.3 mEq/L (ref 3.5–5.3)
Sodium: 140 mEq/L (ref 135–145)
Total Bilirubin: 0.5 mg/dL (ref 0.3–1.2)
Total Protein: 6.5 g/dL (ref 6.0–8.3)

## 2012-10-09 LAB — LIPID PANEL
HDL: 38 mg/dL — ABNORMAL LOW (ref >39)
LDL Cholesterol: 51 mg/dL (ref 0–99)
Triglycerides: 205 mg/dL — ABNORMAL HIGH (ref <150)
VLDL: 41 mg/dL — ABNORMAL HIGH (ref 0–40)

## 2012-10-09 LAB — CBC WITH DIFFERENTIAL/PLATELET
Basophils Relative: 0 % (ref 0–1)
Eosinophils Absolute: 0.2 10*3/uL (ref 0.0–0.7)
HCT: 39 % (ref 39.0–52.0)
Hemoglobin: 13.3 g/dL (ref 13.0–17.0)
MCH: 28.7 pg (ref 26.0–34.0)
MCHC: 34.1 g/dL (ref 30.0–36.0)
MCV: 84.2 fL (ref 78.0–100.0)
Monocytes Absolute: 0.6 10*3/uL (ref 0.1–1.0)
Monocytes Relative: 8 % (ref 3–12)

## 2012-10-09 LAB — POCT URINALYSIS DIPSTICK
Leukocytes, UA: NEGATIVE
Spec Grav, UA: 1.025
pH, UA: 5.5

## 2012-10-09 LAB — HEMOGLOBIN A1C: Hgb A1c MFr Bld: 6.3 % — ABNORMAL HIGH (ref <5.7)

## 2012-10-09 MED ORDER — PRAMIPEXOLE DIHYDROCHLORIDE 0.25 MG PO TABS: 0.2500 mg | ORAL_TABLET | Freq: Three times a day (TID) | ORAL | Status: DC

## 2012-10-09 MED ORDER — METFORMIN HCL 500 MG PO TABS: 500.0000 mg | ORAL_TABLET | Freq: Two times a day (BID) | ORAL | Status: DC

## 2012-10-09 MED ORDER — GABAPENTIN 100 MG PO CAPS: 200.0000 mg | ORAL_CAPSULE | Freq: Four times a day (QID) | ORAL | Status: DC

## 2012-10-09 MED ORDER — FUROSEMIDE 20 MG PO TABS: 20.0000 mg | ORAL_TABLET | Freq: Two times a day (BID) | ORAL | Status: DC | PRN

## 2012-10-09 MED ORDER — QUINAPRIL HCL 20 MG PO TABS: 20.0000 mg | ORAL_TABLET | Freq: Every day | ORAL | Status: DC

## 2012-10-09 MED ORDER — RIVAROXABAN 20 MG PO TABS: 20.0000 mg | ORAL_TABLET | Freq: Every day | ORAL | Status: DC

## 2012-10-09 MED ORDER — FINASTERIDE 5 MG PO TABS: 5.0000 mg | ORAL_TABLET | Freq: Every day | ORAL | Status: DC

## 2012-10-09 MED ORDER — ATORVASTATIN CALCIUM 80 MG PO TABS: 80.0000 mg | ORAL_TABLET | Freq: Every day | ORAL | Status: DC

## 2012-10-09 MED ORDER — HYDRALAZINE HCL 100 MG PO TABS: 100.0000 mg | ORAL_TABLET | Freq: Three times a day (TID) | ORAL | Status: DC

## 2012-10-09 MED ORDER — QUINAPRIL HCL 10 MG PO TABS: 10.0000 mg | ORAL_TABLET | Freq: Every day | ORAL | Status: DC

## 2012-10-09 MED ORDER — CELECOXIB 200 MG PO CAPS: 200.0000 mg | ORAL_CAPSULE | Freq: Two times a day (BID) | ORAL | Status: DC

## 2012-10-09 MED ORDER — GLUCOSE BLOOD VI STRP: ORAL_STRIP | Status: DC

## 2012-10-09 MED ORDER — METOPROLOL SUCCINATE ER 50 MG PO TB24: ORAL_TABLET | ORAL | Status: DC

## 2012-10-09 MED ORDER — ACCU-CHEK SOFTCLIX LANCETS MISC: Status: DC

## 2012-10-09 NOTE — Assessment & Plan Note (Signed)
Controlled; obtain labs; refills provided.

## 2012-10-09 NOTE — Assessment & Plan Note (Signed)
Asymptomatic; followed by cardiology yearly.

## 2012-10-09 NOTE — Assessment & Plan Note (Signed)
Controlled; obtain labs; refill of medications.

## 2012-10-09 NOTE — Assessment & Plan Note (Signed)
Unchanged; recommend decreasing Gabapentin 100mg  to one tablet every morning, three tablets every afternoon, two tablets every night.  If hypersomnolence persists, consider sleep study.

## 2012-10-09 NOTE — Progress Notes (Addendum)
[address]   [place], [place]  [address]   [phone]  Subjective:    Patient ID: Kevin Choi, male    DOB: 1940-07-20, 72 y.o.   MRN: [id number]  HPI This 72 y.o. male presents for evaluation of Annual Wellness Exam.    Last physical 2013. Colonoscopy 2007.  [place].  Repeat in 10 years. TDAP 12/14/2010. Pneumovax 2007. Zostavax 2010. Flu vaccine 12/21/2011. Eye exam 2014; no diabetic retinopathy; [facility]; 06/2012. Dental exam no coverage; several years ago; 2012. Prostate exam by Dr. Achilles Dunk every six months. [place] scheduled for October 2014.  Drove to 19 states in six weeks.  Now going to [place].   DMII: four month follow-up; s/p eye exam 06/2012; checking sugars 120 is average. No change to management made at last visit; reports good compliance with medication; good tolerance to medication; good symptom control.   HTN: four month follow-up; no changes to management made at last visit; reports good tolerance to medication; good compliance to medication; good symptom control.   Average home BP 130-140s/60-70s.    Hypersomnolence:  Did not adjust dose of Gabapentin after last visit.  Peripheral neuropathy most bothersome in evening.  R 2nd toe: distal toe pain; feels like it is broken.  Has hit toe 3rd but not 2nd toe.   Onset four weeks ago.  No skin issues.  No feet wounds.  Peripheral neuropathy:  Stable on Gabapentin; requesting rx for diabetic shoes due to neuropathy.  Review of Systems  Constitutional: Negative for fever, chills, diaphoresis, activity change, appetite change, fatigue and unexpected weight change.  HENT: Positive for hearing loss, neck stiffness and tinnitus. Negative for ear pain, nosebleeds, congestion, facial swelling, rhinorrhea, sneezing, neck pain, postnasal drip and ear discharge.   Eyes: Negative for photophobia, pain, discharge, redness, itching and visual disturbance.  Respiratory: Negative for cough, shortness of breath, wheezing  and stridor.   Cardiovascular: Negative for chest pain, palpitations and leg swelling.  Gastrointestinal: Negative for nausea, vomiting, abdominal pain, diarrhea, constipation, blood in stool, abdominal distention, anal bleeding and rectal pain.  Endocrine: Negative for cold intolerance, heat intolerance, polydipsia, polyphagia and polyuria.  Genitourinary: Negative for dysuria, urgency, decreased urine volume, penile swelling, scrotal swelling, penile pain and testicular pain.  Musculoskeletal: Positive for myalgias and arthralgias. Negative for back pain, joint swelling and gait problem.  Neurological: Positive for numbness. Negative for dizziness, tremors, seizures, syncope, facial asymmetry, speech difficulty, weakness, light-headedness and headaches.  Hematological: Negative for adenopathy. Does not bruise/bleed easily.  Psychiatric/Behavioral: Negative for suicidal ideas, sleep disturbance, self-injury, dysphoric mood and decreased concentration. The patient is not nervous/anxious.     Past Medical History  Diagnosis Date  . CAD (coronary artery disease) 2002    bypass  . Diabetes mellitus   . HTN (hypertension) 05/08/2007  . HLD (hyperlipidemia)   . Pleural effusion 2007    with pneumothorax   . TIA (transient ischemic attack)     Patient stated 2 months ago  . Peripheral vascular disease   . Headache(784.0)   . Slurred speech   . Nausea with vomiting   . Hyperhidrosis   . Leg pain   . Personal history of urinary calculi   . Hemiplegic migraine, without mention of intractable migraine without mention of status migrainosus   . Elevated prostate specific antigen (PSA) 02/21/2001  . Allergic rhinitis due to pollen   . Occlusion and stenosis of carotid artery without mention of cerebral infarction 09/15/2006  . Restless legs  syndrome (RLS) 01/10/2007  . Impotence of organic origin 07/24/2007  . Chicken pox   . Measles   . Mumps   . Peripheral neuropathy 01/31/2008    Past  Surgical History  Procedure Laterality Date  . Coronary artery bypass graft  2001  . Carotid endarterectomy  2001    right  . Collapse lung  2007  . Parotid gland tumor excision  1973  . Cholecystectomy  2003  . Lung surgery  2007    Left; decortication left lung for persistant pleural effusion and PTX. admission ten days at [facility].  . Vasectomy      Prior to Admission medications   Medication Sig Start Date End Date Taking? Authorizing Provider  ACCU-CHEK SOFTCLIX LANCETS lancets To check blood sugar tid 07/12/12  Yes Heather M Marte, PA-C  atorvastatin (LIPITOR) 80 MG tablet Take 1 tablet (80 mg total) by mouth daily. 03/13/12  Yes Ethelda Chick, MD  celecoxib (CELEBREX) 200 MG capsule Take 200 mg by mouth 2 (two) times daily.   Yes Historical Provider, MD  cetirizine (ZYRTEC) 10 MG tablet Take 10 mg by mouth as needed.    Yes Historical Provider, MD  diphenoxylate-atropine (LOMOTIL) 2.5-0.025 MG per tablet Take 1 tablet by mouth 4 (four) times daily as needed for diarrhea or loose stools. 06/24/12  Yes Ethelda Chick, MD  finasteride (PROSCAR) 5 MG tablet Take 1 tablet (5 mg total) by mouth daily. 03/13/12  Yes Ethelda Chick, MD  furosemide (LASIX) 20 MG tablet Take 1 tablet (20 mg total) by mouth 2 (two) times daily as needed. 03/02/12  Yes Antonieta Iba, MD  gabapentin (NEURONTIN) 100 MG capsule Take 2 capsules (200 mg total) by mouth 4 (four) times daily. 03/13/12  Yes Ethelda Chick, MD  glucose blood (ACCU-CHEK COMPACT PLUS) test strip USE TO TEST BLOOD SUGAR ONCE DAILY dx 250.00 07/18/12  Yes Ryan M Dunn, PA-C  hydrALAZINE (APRESOLINE) 100 MG tablet Take 1 tablet (100 mg total) by mouth 3 (three) times daily. 03/13/12  Yes Ethelda Chick, MD  magnesium gluconate (MAGONATE) 500 MG tablet Take 500 mg by mouth daily.   Yes Historical Provider, MD  metFORMIN (GLUCOPHAGE) 500 MG tablet Take 1 tablet (500 mg total) by mouth 2 (two) times daily with a meal. 06/18/12  Yes Ethelda Chick, MD    metoprolol succinate (TOPROL-XL) 50 MG 24 hr tablet Take one and half tablet daily. 03/13/12  Yes Ethelda Chick, MD  Multiple Vitamins-Minerals (MULTIVITAL) tablet Take 1 tablet by mouth daily.     Yes Historical Provider, MD  nitroGLYCERIN (NITROSTAT) 0.4 MG SL tablet Place 0.4 mg under the tongue every 5 (five) minutes as needed.     Yes Historical Provider, MD  Omega-3 Fatty Acids (FISH OIL) 1000 MG CAPS Take by mouth 2 (two) times daily.     Yes Historical Provider, MD  pramipexole (MIRAPEX) 0.25 MG tablet Take 1 tablet (0.25 mg total) by mouth 3 (three) times daily. 03/13/12  Yes Ethelda Chick, MD  promethazine (PHENERGAN) 12.5 MG tablet Take 1 tablet (12.5 mg total) by mouth every 8 (eight) hours as needed for nausea. 06/26/12  Yes Ethelda Chick, MD  quinapril (ACCUPRIL) 20 MG tablet Take 1.5 tablets (30 mg total) by mouth daily. Takes 1 and 1/2 tablet daily. 06/22/12  Yes Ethelda Chick, MD  Rivaroxaban (XARELTO) 20 MG TABS Take 1 tablet (20 mg total) by mouth daily. 03/13/12  Yes Ethelda Chick,  MD    Allergies  Allergen Reactions  . Other Nausea Only    Contrast Dye-drops blood pressure.    History   Social History  . Marital Status: Married    Spouse Name: N/A    Number of Children: 2  . Years of Education: college   Occupational History  . retired [employer] 10/2008  .      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 Smoking status: Former Smoker -- 2.00 packs/day for 10 years    Types: Cigarettes  . Smokeless tobacco: Not on file  . Alcohol Use: No  . Drug Use: No  . Sexual Activity: Not Currently   Other Topics Concern  . Not on file   Social History Narrative   Retired. Regularly exercises. Light;Treadmill at the [employer] training some, works in the yard. Patient does not have Living Will,does not have HCPOA.    Marital status:  Married x 53 years, Happily.       Children:  1 son, 1 daughter has 6 grandchildren.      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        Exercising:  Hiking, playing golf, volunteering is very physical.      Per Dr. Mariah Milling- No Salt Diet. Performs all ADL's, drives. Caffeine use: Tea. Carbonated beverages; occasionally. Smoke alarms and carbon monoxide detectors in the home. Guns in the home not stored in locked cabinet. Organ donor: Yes.    Family History  Problem Relation Age of Onset  . Cancer Other   . Coronary artery disease Other     brother  . Heart attack Mother   . Heart failure Father   . Angina Father   . Emphysema Father   . Cancer Father     prostate  . Heart attack Brother   . Heart disease Maternal Aunt   . Lymphoma Sister     Non-Hodgkins  . Colon polyps Sister   . Anxiety disorder Brother        Objective:   Physical Exam  Nursing note and vitals reviewed. Constitutional: He is oriented to person, place, and time. He appears well-developed and well-nourished. No distress.  HENT:  Head: Normocephalic and atraumatic.  Right Ear: External ear normal.  Left Ear: External ear normal.  Nose: Nose normal.  Mouth/Throat: Oropharynx is clear and moist.  Eyes: Conjunctivae and EOM are normal. Pupils are equal, round, and reactive to light.  Neck: Normal range of motion. Neck supple. No JVD present. No thyromegaly present.  Well healed incision/scar R lateral neck.  Cardiovascular: Normal rate, regular rhythm, normal heart sounds and intact distal pulses.  Exam reveals no gallop and no friction rub.   No murmur heard. Pulmonary/Chest: Effort normal and breath sounds normal. He has no wheezes. He has no rales. He exhibits no tenderness.  Abdominal: Soft. Bowel sounds are normal. He exhibits no distension and no mass. There is no tenderness. There is no rebound and no guarding.  Musculoskeletal: Normal range of motion.  Lymphadenopathy:    He has no cervical adenopathy.  Neurological: He is alert and oriented to person,  place, and time. He has normal reflexes. No cranial nerve deficit. He exhibits normal muscle tone. Coordination normal.  Skin: Skin is warm and dry. No rash noted. He is not diaphoretic. No erythema. No pallor.  +callus formation along feet.  Psychiatric: He has a normal mood  and affect. His behavior is normal. Judgment and thought content normal.   EKG:  NSR; T wave changes lateral leads.    Assessment & Plan:

## 2012-10-09 NOTE — Assessment & Plan Note (Signed)
Controlled; obtain labs; continue current medications. 

## 2012-10-09 NOTE — Telephone Encounter (Signed)
Glucose test strips and lancets RX was faxed.

## 2012-10-09 NOTE — Assessment & Plan Note (Signed)
Anticipatory guidance --- advised to formalize Living Will.  Colonoscopy UTD. Immunizations UTD.  Moderate fall risk due to PN.  No evidence of depression. Mild hearing loss reported. Independent with ADLs.

## 2012-10-10 ENCOUNTER — Encounter: Payer: Self-pay | Admitting: Family Medicine

## 2012-10-15 ENCOUNTER — Encounter: Payer: Self-pay | Admitting: Cardiovascular Disease

## 2012-10-15 ENCOUNTER — Ambulatory Visit (INDEPENDENT_AMBULATORY_CARE_PROVIDER_SITE_OTHER): Payer: Medicare Other | Admitting: Cardiovascular Disease

## 2012-10-15 VITALS — BP 118/70 | HR 66 | Ht 68.5 in | Wt 220.8 lb

## 2012-10-15 DIAGNOSIS — I658 Occlusion and stenosis of other precerebral arteries: Secondary | ICD-10-CM

## 2012-10-15 DIAGNOSIS — I10 Essential (primary) hypertension: Secondary | ICD-10-CM

## 2012-10-15 DIAGNOSIS — I6529 Occlusion and stenosis of unspecified carotid artery: Secondary | ICD-10-CM

## 2012-10-15 DIAGNOSIS — I2510 Atherosclerotic heart disease of native coronary artery without angina pectoris: Secondary | ICD-10-CM

## 2012-10-15 DIAGNOSIS — Z9889 Other specified postprocedural states: Secondary | ICD-10-CM

## 2012-10-15 DIAGNOSIS — E119 Type 2 diabetes mellitus without complications: Secondary | ICD-10-CM

## 2012-10-15 DIAGNOSIS — E785 Hyperlipidemia, unspecified: Secondary | ICD-10-CM

## 2012-10-15 DIAGNOSIS — I6523 Occlusion and stenosis of bilateral carotid arteries: Secondary | ICD-10-CM

## 2012-10-15 NOTE — Assessment & Plan Note (Signed)
Currently with no symptoms of angina. No further workup at this time. Continue current medication regimen. 

## 2012-10-15 NOTE — Patient Instructions (Addendum)
You are doing well. No medication changes were made.  We will schedule a carotid ultrasound for March 2015  Please call us if you have new issues that need to be addressed before your next appt.  Your physician wants you to follow-up in: 12 months.  You will receive a reminder letter in the mail two months in advance. If you don't receive a letter, please call our office to schedule the follow-up appointment.

## 2012-10-15 NOTE — Assessment & Plan Note (Signed)
Cholesterol is at goal on the current lipid regimen. No changes to the medications were made.  

## 2012-10-15 NOTE — Assessment & Plan Note (Signed)
Diabetes well-controlled.  We have encouraged continued exercise, careful diet management in an effort to lose weight.

## 2012-10-15 NOTE — Assessment & Plan Note (Signed)
Blood pressure is well controlled on today's visit. No changes made to the medications. 

## 2012-10-15 NOTE — Progress Notes (Signed)
Patient ID: Kevin Choi, male    DOB: [birth year], 72 y.o.   MRN: [id number]  HPI Comments: 72 year old gentleman with a history of coronary artery disease, bypass surgery with a LIMA to the LAD, vein graft to the diagonal, diabetes, hypertension, hyperlipidemia, history of pleural effusion requiring a VATS, carotid endarterectomy on the right who presents for routine followup. He has had TIA symptoms 3 times, once in 2002, October 2012 in March 2013.  Previous TIA symptoms in October 2012.   May 14 2011  he had acute onset of slurred speech. He was playing cards shortly after and he was unable to do any math. Symptoms lasted for approximately one to 2 hours. Blood pressure prior to this event had been well controlled though during and after the event, he had severe hypertension with systolic pressures sometimes greater than 200. He was in the emergency room for 2 days with extensive testing.  carotid ultrasound that showed no hemodynamically significant stenosis Head CT scan showing no acute stroke MRI of the head showing no infarction. Stable appearing area of enhancement of the right temporal lobe most compatible with meningioma Chest x-ray was essentially normal  Overall he reports that he has been doing well. Blood pressures ranging in the 120-140 range. Hemoglobin A1c 6.3. Lipids are well controlled. Is otherwise active, feels well with no complaints.  Stress test April 2009 shows no significant ischemia. This was a pharmacologic study.  EKG shows normal sinus rhythm with rate 57 beats per minute with no significant ST or T wave changes   Outpatient Encounter Prescriptions as of 10/15/2012  Medication Sig Dispense Refill  . ACCU-CHEK SOFTCLIX LANCETS lancets To check blood sugar once daily  100 each  11  . atorvastatin (LIPITOR) 80 MG tablet Take 1 tablet (80 mg total) by mouth daily.  30 tablet  3  . celecoxib (CELEBREX) 200 MG capsule Take 1 capsule (200 mg total) by mouth 2 (two)  times daily.  30 capsule  3  . cetirizine (ZYRTEC) 10 MG tablet Take 10 mg by mouth as needed.       . diphenoxylate-atropine (LOMOTIL) 2.5-0.025 MG per tablet Take 1 tablet by mouth 4 (four) times daily as needed for diarrhea or loose stools.  30 tablet  0  . finasteride (PROSCAR) 5 MG tablet Take 1 tablet (5 mg total) by mouth daily.  30 tablet  3  . furosemide (LASIX) 20 MG tablet Take 1 tablet (20 mg total) by mouth 2 (two) times daily as needed.  60 tablet  3  . gabapentin (NEURONTIN) 100 MG capsule Take 2 capsules (200 mg total) by mouth 4 (four) times daily.  240 capsule  3  . hydrALAZINE (APRESOLINE) 100 MG tablet Take 1 tablet (100 mg total) by mouth 3 (three) times daily.  90 tablet  3  . magnesium gluconate (MAGONATE) 500 MG tablet Take 500 mg by mouth daily.      . metFORMIN (GLUCOPHAGE) 500 MG tablet Take 1 tablet (500 mg total) by mouth 2 (two) times daily with a meal.  60 tablet  3  . metoprolol succinate (TOPROL-XL) 50 MG 24 hr tablet Take one and half tablet daily.  45 tablet  3  . Multiple Vitamins-Minerals (MULTIVITAL) tablet Take 1 tablet by mouth daily.        . nitroGLYCERIN (NITROSTAT) 0.4 MG SL tablet Place 0.4 mg under the tongue every 5 (five) minutes as needed.        . Omega-3 Fatty  Acids (FISH OIL) 1000 MG CAPS Take by mouth 2 (two) times daily.        . pramipexole (MIRAPEX) 0.25 MG tablet Take 1 tablet (0.25 mg total) by mouth 3 (three) times daily.  90 tablet  3  . promethazine (PHENERGAN) 12.5 MG tablet Take 1 tablet (12.5 mg total) by mouth every 8 (eight) hours as needed for nausea.  20 tablet  0  . quinapril (ACCUPRIL) 10 MG tablet Take 1 tablet (10 mg total) by mouth daily.  30 tablet  3  . quinapril (ACCUPRIL) 20 MG tablet Take 1 tablet (20 mg total) by mouth daily.  30 tablet  3  . Rivaroxaban (XARELTO) 20 MG TABS tablet Take 1 tablet (20 mg total) by mouth daily.  30 tablet  3  . [DISCONTINUED] glucose blood (ACCU-CHEK COMPACT PLUS) test strip USE TO TEST  BLOOD SUGAR ONCE DAILY dx 250.00  100 each  11    Review of Systems  Constitutional: Negative.   HENT: Negative.   Eyes: Negative.   Respiratory: Negative.   Cardiovascular: Negative.   Gastrointestinal: Negative.   Musculoskeletal: Positive for back pain.  Skin: Negative.   Neurological: Negative.   Psychiatric/Behavioral: Negative.   All other systems reviewed and are negative.   BP 118/70  Pulse 66  Ht 5' 8.5" (1.74 m)  Wt 220 lb 12 oz (100.132 kg)  BMI 33.07 kg/m2  Physical Exam  Nursing note and vitals reviewed. Constitutional: He is oriented to person, place, and time. He appears well-developed and well-nourished.  HENT:  Head: Normocephalic.  Nose: Nose normal.  Mouth/Throat: Oropharynx is clear and moist.  Eyes: Conjunctivae are normal. Pupils are equal, round, and reactive to light.  Neck: Normal range of motion. Neck supple. No JVD present.  Cardiovascular: Normal rate, regular rhythm, S1 normal, S2 normal, normal heart sounds and intact distal pulses.  Exam reveals no gallop and no friction rub.   No murmur heard. Trace edema above the sock line, pitting  Pulmonary/Chest: Effort normal and breath sounds normal. No respiratory distress. He has no wheezes. He has no rales. He exhibits no tenderness.  Abdominal: Soft. Bowel sounds are normal. He exhibits no distension. There is no tenderness.  Musculoskeletal: Normal range of motion. He exhibits no edema and no tenderness.  Lymphadenopathy:    He has no cervical adenopathy.  Neurological: He is alert and oriented to person, place, and time. Coordination normal.  Skin: Skin is warm and dry. No rash noted. No erythema.  Psychiatric: He has a normal mood and affect. His behavior is normal. Judgment and thought content normal.      Assessment and Plan

## 2012-12-27 ENCOUNTER — Other Ambulatory Visit: Payer: Self-pay

## 2013-01-02 ENCOUNTER — Telehealth: Payer: Self-pay

## 2013-01-02 NOTE — Telephone Encounter (Signed)
Do you have this?

## 2013-01-02 NOTE — Telephone Encounter (Signed)
PT STATES THE FORM DR Katrinka Blazing HAVE REGARDING HIS DIABETIC SHOES HAVE TO BE IN BEFORE THE DAY IS OVER OR THEY HAVE TO START ALL OVER AGAIN. REALLY IMPORTANT TO GET THIS DONE TODAY FOR HIM AND HIS WIFE. PLEASE CALL [phone]

## 2013-01-04 ENCOUNTER — Telehealth: Payer: Self-pay | Admitting: Radiology

## 2013-01-04 NOTE — Telephone Encounter (Signed)
Thanks, I have called them to advise.

## 2013-01-04 NOTE — Telephone Encounter (Signed)
I completed forms earlier in the week; they were placed at front to be faxed.

## 2013-01-04 NOTE — Telephone Encounter (Signed)
Spoke to patient advised the forms sent for the diabetic shoes, medicap needs additional information. They need face to face exam,stating his need for the shoes, you did examine his feet on 10/09/12, can you add this to prior note?

## 2013-01-22 ENCOUNTER — Telehealth: Payer: Self-pay

## 2013-01-22 NOTE — Telephone Encounter (Signed)
Patient notified and voiced understanding. He states this needs to be done by tomorrow.

## 2013-01-22 NOTE — Telephone Encounter (Signed)
I did; please forgive me; this is my mistake; I thought that I was going to need to readdress this at patient's follow-up visit because it had been three months since original paperwork had been completed.  I know that pharmacy stated that I had completed forms in 09/2012 but that we would need to repeat paperwork due to time lapse.  I can addend the notes from 09/2012 as long as it has not been too long. Please explain to patient that this is my mistake.  I will addend his note from 09/2012 and his wife's note.

## 2013-01-22 NOTE — Telephone Encounter (Signed)
Did you get my previous message? Sent on 01/04/13, I did not get a reply.

## 2013-01-22 NOTE — Telephone Encounter (Signed)
Patient is very irritated with [place] regarding his diabetic shoes he says he will not hold his breath for a call back, because it seems like we just dont know how to handle the situation [phone]

## 2013-01-23 DIAGNOSIS — E1142 Type 2 diabetes mellitus with diabetic polyneuropathy: Secondary | ICD-10-CM | POA: Insufficient documentation

## 2013-01-23 NOTE — Telephone Encounter (Signed)
Office note from 10/09/12 addended; please fax addended office note from 10/09/12 to [facility] in [place].

## 2013-01-23 NOTE — Telephone Encounter (Signed)
Office notes addended from 10/09/12; please fax to [facility] in [place].

## 2013-01-23 NOTE — Assessment & Plan Note (Signed)
Controlled with current dose of Gabapentin.  Recommend regular evaluation of feet to confirm no new lesions, sores, abnormalities. Rx for diabetic shoes provided due to peripheral neuropathy with callus formation present.

## 2013-01-24 NOTE — Telephone Encounter (Signed)
Thanks faxed. mychart message sent to patient.

## 2013-01-28 ENCOUNTER — Ambulatory Visit (INDEPENDENT_AMBULATORY_CARE_PROVIDER_SITE_OTHER): Payer: Medicare Other | Admitting: Podiatry

## 2013-01-28 ENCOUNTER — Encounter: Payer: Self-pay | Admitting: Podiatry

## 2013-01-28 ENCOUNTER — Ambulatory Visit (INDEPENDENT_AMBULATORY_CARE_PROVIDER_SITE_OTHER): Payer: Medicare Other

## 2013-01-28 VITALS — BP 125/59 | HR 84 | Resp 16 | Ht 68.0 in | Wt 210.0 lb

## 2013-01-28 DIAGNOSIS — M79671 Pain in right foot: Secondary | ICD-10-CM

## 2013-01-28 DIAGNOSIS — L600 Ingrowing nail: Secondary | ICD-10-CM

## 2013-01-28 DIAGNOSIS — M79609 Pain in unspecified limb: Secondary | ICD-10-CM

## 2013-01-28 LAB — HM DIABETES FOOT EXAM

## 2013-01-28 MED ORDER — NEOMYCIN-POLYMYXIN-HC 3.5-10000-1 OT SOLN: OTIC | Status: DC

## 2013-01-28 NOTE — Progress Notes (Signed)
N PAIN L B/L GREAT TOENAILS D 5 YRS O SLOWLY C WORSE A GROWING IN T LAMISIL TABS, CREME, LIQUID PAINT, LISTERINE SOAKS, VICKS VAPOR RUB, PT TRIMS TOENAILS

## 2013-01-28 NOTE — Progress Notes (Signed)
Mr. Barreiro presents today as a 72 year old white male with a chief complaint of painful toenails hallux left and a painful ingrown toenail to the hallux right he states it is have been bothering him for the about the past 5 years and seems to be getting worse margins are starting to grow in his tried Lamisil tablets creams liquid pain on list ring soaks Vicks VapoRub nothing seems to help.  Objective: I have reviewed his past medical history medications allergies surgeries social history family history review of systems. Vital signs are stable he is alert and oriented x3. Pulses are strongly palpable bilateral lower extremity. Capillary fill time to digits one through 5 of the bilateral foot is noted to be immediate. Neurologic sensorium is intact per Semmes-Weinstein monofilament. Deep tendon reflexes are brisk and equal bilateral. Muscle strength +5 over 5 dorsiflexors plantar flexors inverters and evertors with all intrinsic musculature is intact. Orthopedic evaluation demonstrates all joints distal to the ankle have a full range of motion without crepitation. Cutaneous evaluation demonstrates supple while hydrated cutis with sharp incurvated nail margins to the tibial and fibular border of the hallux right. There is mild erythema along the tibial and fibular border no granulation tissue noted. Months and no odor. The left hallux does demonstrate a thick nail plate which is painful on palpation. Mild erythema surrounding the nail consistent with paronychia.  Assessment: Paronychia hallux left with onychomycosis. Ingrown nail paronychia hallux right tibial and fibular border.  Plan: Discussed etiology pathology conservative versus surgical therapies at this point we performed chemical matrixectomy's to both nails partial hallux right tibial and fibular border with total matrixectomy hallux left. This was performed today after 3 cc of a 50-50 mixture of Marcaine plain and lidocaine plain was infiltrated in a  hallux block bilateral. Both were then prepped and draped in the normal sterile fashion. The right hallux nail plate was split along the tibial and fibular border from distal to proximal. The nail margins were avulsed exposing the matrix and nailbed. 3 applications of phenol were applied to the nail matrix and nailbed neutralized with isopropyl alcohol dressed with Surgicel Telfa pad and a dry sterile compressive dressing. The contralateral hallux demonstrates total avulsion of the nail plate 3 applications of phenol were applied neutralized with isopropyl alcohol Surgicel was applied and dressed with a dry sterile compressive dressing. He was given a prescription for Cortisporin Otic. He was also given both oral and written instructions for soaking. I will followup with him in one week he will call sooner with questions or concerns.

## 2013-01-28 NOTE — Patient Instructions (Signed)

## 2013-01-29 ENCOUNTER — Ambulatory Visit: Payer: Medicare Other | Admitting: Family Medicine

## 2013-02-04 ENCOUNTER — Encounter: Payer: Self-pay | Admitting: Podiatry

## 2013-02-04 ENCOUNTER — Ambulatory Visit (INDEPENDENT_AMBULATORY_CARE_PROVIDER_SITE_OTHER): Payer: Medicare Other | Admitting: Podiatry

## 2013-02-04 VITALS — BP 125/59 | HR 84 | Resp 18

## 2013-02-04 DIAGNOSIS — Z9889 Other specified postprocedural states: Secondary | ICD-10-CM

## 2013-02-04 NOTE — Progress Notes (Signed)
   Subjective:    Patient ID: Kevin Choi, male    DOB: [birth year], 72 y.o.   MRN: [id number]  HPI Comments: Doing well, both big toenails did not soak them this morning      Review of Systems     Objective:   Physical Exam pulses remain palpable bilateral. Hallux nails demonstrate mild erythema no cellulitis drainage or odor granulation tissue is present with epithelialization occurring.        Assessment & Plan:  Assessment: Well-healing surgical toes hallux bilateral.  Plan: Continue to soak in Epsom salts and water twice a day basis apply Cortisporin otic as directed. Cover during the day and leave open at night. Continue to soak until there is no longer any redness no tenderness and no drainage a Band-Aid. Followup with me should there questions arise

## 2013-02-05 NOTE — Telephone Encounter (Signed)
Pt called said he used 2 cups epson salt to soak both big toes and now his toes are red, painful and one has a blood blister. Told pt to use water and betadine today with neosporin and go to epson salt tomorrow only using 4 tablespoon salt water. If toes do not get any better to call back.

## 2013-02-06 ENCOUNTER — Telehealth: Payer: Self-pay

## 2013-02-06 NOTE — Telephone Encounter (Signed)
Pt states he his having a problem with his medication, Xarelto, states it will cost him $400, asks if he can take Plavix and baby aspirin until mid January when he will be able to afford it. Please call and advise.

## 2013-02-06 NOTE — Telephone Encounter (Signed)
Spoke w/ pt.  Will leave samples of xarelto 20 mg at front desk for pt to pick up.

## 2013-02-25 ENCOUNTER — Ambulatory Visit (INDEPENDENT_AMBULATORY_CARE_PROVIDER_SITE_OTHER): Payer: Medicare HMO | Admitting: Podiatry

## 2013-02-25 ENCOUNTER — Encounter: Payer: Self-pay | Admitting: Podiatry

## 2013-02-25 VITALS — BP 110/58 | HR 92 | Resp 18

## 2013-02-25 DIAGNOSIS — L02619 Cutaneous abscess of unspecified foot: Secondary | ICD-10-CM

## 2013-02-25 DIAGNOSIS — L03039 Cellulitis of unspecified toe: Secondary | ICD-10-CM

## 2013-02-25 DIAGNOSIS — Z9889 Other specified postprocedural states: Secondary | ICD-10-CM

## 2013-02-25 MED ORDER — AMOXICILLIN-POT CLAVULANATE 500-125 MG PO TABS: 1.0000 | ORAL_TABLET | Freq: Three times a day (TID) | ORAL | Status: DC

## 2013-02-25 NOTE — Progress Notes (Signed)
   Subjective:    Patient ID: Kevin Choi, male    DOB: 12-13-1940, 73 y.o.   MRN: [id number]  HPI Comments: " just a follow up on the toes , i think they are doing all right, but i need him to tell me "     Review of Systems     Objective:   Physical Exam: Vital signs are stable he is alert and oriented x3. Pulses are palpable bilateral. Hallux nail left where matrixectomy total nail was performed does demonstrate some fibrin deposition with erythema surrounding the nailbed. He also has no erythema to the tibial portion of the matrixectomy hallux right. He states his blood sugars have been good. He also states it is been soaking in Epsom salts warm water.        Assessment & Plan:  Assessment: Mild cellulitis status post matrixectomy hallux left.  Plan: Debridement of necrotic tissue and wrote her prescription for Augmentin 875 one by mouth twice a day. He will continue soaks twice daily covered in today.. All followup with him in 2-4 weeks.

## 2013-03-07 ENCOUNTER — Telehealth: Payer: Self-pay | Admitting: Emergency Medicine

## 2013-03-07 ENCOUNTER — Encounter: Payer: Self-pay | Admitting: Adult Health

## 2013-03-07 ENCOUNTER — Ambulatory Visit (INDEPENDENT_AMBULATORY_CARE_PROVIDER_SITE_OTHER): Payer: Medicare HMO | Admitting: Adult Health

## 2013-03-07 VITALS — BP 108/60 | HR 67 | Temp 98.0°F | Resp 12 | Ht 67.5 in | Wt 220.5 lb

## 2013-03-07 DIAGNOSIS — E119 Type 2 diabetes mellitus without complications: Secondary | ICD-10-CM

## 2013-03-07 DIAGNOSIS — I10 Essential (primary) hypertension: Secondary | ICD-10-CM

## 2013-03-07 DIAGNOSIS — Z79899 Other long term (current) drug therapy: Secondary | ICD-10-CM

## 2013-03-07 MED ORDER — GLUCOSE BLOOD VI STRP: ORAL_STRIP | Status: DC

## 2013-03-07 MED ORDER — CELECOXIB 200 MG PO CAPS: 200.0000 mg | ORAL_CAPSULE | Freq: Two times a day (BID) | ORAL | Status: DC

## 2013-03-07 MED ORDER — FINASTERIDE 5 MG PO TABS: 5.0000 mg | ORAL_TABLET | Freq: Every day | ORAL | Status: DC

## 2013-03-07 MED ORDER — METFORMIN HCL 500 MG PO TABS: 500.0000 mg | ORAL_TABLET | Freq: Two times a day (BID) | ORAL | Status: DC

## 2013-03-07 MED ORDER — GABAPENTIN 100 MG PO CAPS: 200.0000 mg | ORAL_CAPSULE | Freq: Four times a day (QID) | ORAL | Status: DC

## 2013-03-07 MED ORDER — DIPHENOXYLATE-ATROPINE 2.5-0.025 MG PO TABS: 1.0000 | ORAL_TABLET | Freq: Four times a day (QID) | ORAL | Status: DC | PRN

## 2013-03-07 MED ORDER — HYDRALAZINE HCL 100 MG PO TABS: 100.0000 mg | ORAL_TABLET | Freq: Three times a day (TID) | ORAL | Status: DC

## 2013-03-07 MED ORDER — QUINAPRIL HCL 10 MG PO TABS: 10.0000 mg | ORAL_TABLET | Freq: Every day | ORAL | Status: DC

## 2013-03-07 MED ORDER — RIVAROXABAN 20 MG PO TABS: 20.0000 mg | ORAL_TABLET | Freq: Every day | ORAL | Status: DC

## 2013-03-07 MED ORDER — PRAMIPEXOLE DIHYDROCHLORIDE 0.25 MG PO TABS: 0.2500 mg | ORAL_TABLET | Freq: Three times a day (TID) | ORAL | Status: DC

## 2013-03-07 MED ORDER — FUROSEMIDE 20 MG PO TABS: 20.0000 mg | ORAL_TABLET | Freq: Two times a day (BID) | ORAL | Status: DC | PRN

## 2013-03-07 MED ORDER — PROMETHAZINE HCL 12.5 MG PO TABS: 12.5000 mg | ORAL_TABLET | Freq: Three times a day (TID) | ORAL | Status: DC | PRN

## 2013-03-07 MED ORDER — METOPROLOL SUCCINATE ER 50 MG PO TB24
ORAL_TABLET | ORAL | Status: DC
Start: 2013-03-07 — End: 2016-06-02

## 2013-03-07 MED ORDER — ATORVASTATIN CALCIUM 80 MG PO TABS: 80.0000 mg | ORAL_TABLET | Freq: Every day | ORAL | Status: DC

## 2013-03-07 MED ORDER — QUINAPRIL HCL 20 MG PO TABS: 20.0000 mg | ORAL_TABLET | Freq: Every day | ORAL | Status: DC

## 2013-03-07 NOTE — Progress Notes (Signed)
Pre visit review using our clinic review tool, if applicable. No additional management support is needed unless otherwise documented below in the visit note. 

## 2013-03-07 NOTE — Assessment & Plan Note (Addendum)
Medications reviewed and refills sent to mail order pharmacy. Also, local refills sent in as well. Pt is on xarelto and cost difficult given fixed income. Recommend he contact [employer] for assistance.

## 2013-03-07 NOTE — Assessment & Plan Note (Signed)
Check HgbA1c. Continue to monitor. Referral to podiatry for toe nail removal follow up.

## 2013-03-07 NOTE — Telephone Encounter (Signed)
Referral for [employer] has been filled out for Dr. Milinda Pointer

## 2013-03-07 NOTE — Assessment & Plan Note (Signed)
Well-controlled on current medications 

## 2013-03-07 NOTE — Progress Notes (Signed)
Subjective:    Patient ID: Kevin Choi, male    DOB: 25-Feb-1940, 73 y.o.   MRN: [id number]  HPI Pt is a pleasant 73 year old male who presents to clinic to establish care. He was previously followed by Dr. Tamala Julian. Patient had his physical exam in August 2014. Labs done at that time and most normal. History of diabetes. Needs hemoglobin A1c checked. He also is requesting all medications sent for 3 months to mail order pharmacy. Overall, patient is feeling well. He reports bilateral toe nails trimmed by Dr. Milinda Pointer. Left toe nail completely removed and the right was partially removed. Needs follow up appt. next week.    Past Medical History  Diagnosis Date  . CAD (coronary artery disease) 2002    bypass  . Diabetes mellitus   . HTN (hypertension) 05/08/2007  . HLD (hyperlipidemia)   . Pleural effusion 2007    with pneumothorax   . TIA (transient ischemic attack)     Patient stated 2 months ago  . Peripheral vascular disease   . Headache(784.0)   . Slurred speech   . Nausea with vomiting   . Hyperhidrosis   . Leg pain   . Personal history of urinary calculi   . Hemiplegic migraine, without mention of intractable migraine without mention of status migrainosus   . Elevated prostate specific antigen (PSA) 02/21/2001  . Allergic rhinitis due to pollen   . Occlusion and stenosis of carotid artery without mention of cerebral infarction 09/15/2006  . Restless legs syndrome (RLS) 01/10/2007  . Impotence of organic origin 07/24/2007  . Chicken pox   . Measles   . Mumps   . Peripheral neuropathy 01/31/2008  . Diabetes   . Neuropathy      Past Surgical History  Procedure Laterality Date  . Coronary artery bypass graft  2001  . Carotid endarterectomy  2001    right  . Collapse lung  2007  . Parotid gland tumor excision  1973  . Lung surgery  2007    Left; decortication left lung for persistant pleural effusion and PTX. admission [facility].  . Vasectomy    .  Cholecystectomy  2003  . Knee surgery Left 1981     Family History  Problem Relation Age of Onset  . Cancer Other   . Coronary artery disease Other     brother  . Heart attack Mother   . Heart failure Father   . Angina Father   . Emphysema Father   . Cancer Father     prostate  . Heart attack Brother   . Heart disease Maternal Aunt   . Lymphoma Sister     Non-Hodgkins  . Colon polyps Sister   . Anxiety disorder Brother      History   Social History  . Marital Status: Married    Spouse Name: Pegge [place]elstadt    Number of Children: 2  . Years of Education: college   Occupational History  . retired [employer] 10/2008  .      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     Social History Main Topics  . Smoking status: Former Smoker -- 2.00 packs/day for 10 years    Types: Cigarettes  . Smokeless tobacco: Never Used  . Alcohol Use: No  . Drug Use: No  . Sexual Activity: Not Currently   Other Topics Concern  . Not on file   Social History Narrative    Marital  status:  Married x 53 years, Happily.       Children:  1 son, 1 daughter has 6 grandchildren.      Employment: Retired; Therapist, sports as needed.  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        Exercising:  Hiking, playing golf, volunteering is very physical.      Per Dr. Rockey Situ- No Salt Diet.       ADLs:  Performs all ADL's, drives.       Caffeine use: Tea. Carbonated beverages; occasionally.       Smoke alarms and carbon monoxide detectors in the home.       Guns in the home not stored in locked cabinet.       Organ donor: Yes.      Patient does not have Living Will,does not have [place].    Review of Systems  Constitutional: Negative.   HENT: Negative.   Eyes: Negative.   Respiratory: Negative.   Cardiovascular: Negative.   Gastrointestinal: Negative.   Endocrine: Negative.   Genitourinary: Negative.   Musculoskeletal: Negative.   Skin: Negative.   Allergic/Immunologic: Negative.     Neurological: Negative.   Hematological: Negative.   Psychiatric/Behavioral: Negative.        Objective:   Physical Exam  Constitutional: He is oriented to person, place, and time. He appears well-developed and well-nourished. No distress.  HENT:  Head: Normocephalic and atraumatic.  Eyes: Conjunctivae and EOM are normal. Pupils are equal, round, and reactive to light.  Neck: Normal range of motion. Neck supple. No tracheal deviation present.  Cardiovascular: Normal rate, regular rhythm, normal heart sounds and intact distal pulses.  Exam reveals no gallop and no friction rub.   No murmur heard. Pulmonary/Chest: Effort normal and breath sounds normal. No respiratory distress. He has no wheezes. He has no rales.  Abdominal: Soft. Bowel sounds are normal.  Musculoskeletal: Normal range of motion. He exhibits no edema.  Lymphadenopathy:    He has no cervical adenopathy.  Neurological: He is alert and oriented to person, place, and time. He has normal reflexes. Coordination normal.  Skin: Skin is warm and dry.  Psychiatric: He has a normal mood and affect. His behavior is normal. Judgment and thought content normal.          Assessment & Plan:

## 2013-03-07 NOTE — Patient Instructions (Signed)
   Thank you for choosing [place] [facility] for your health care needs.  Please have your labs drawn prior to leaving the office.  The results will be available through MyChart for your convenience. Please remember to activate this. The activation code is located at the end of this form.  Please remember to contact [employer] for assistance with xarelto.  Call with any questions or concerns.  Return for follow up in 3 month.

## 2013-03-08 ENCOUNTER — Telehealth: Payer: Self-pay

## 2013-03-08 ENCOUNTER — Telehealth: Payer: Self-pay | Admitting: *Deleted

## 2013-03-08 LAB — HEMOGLOBIN A1C: HEMOGLOBIN A1C: 7.1 % — AB (ref 4.6–6.5)

## 2013-03-08 MED ORDER — GLUCOSE BLOOD VI STRP: ORAL_STRIP | Status: DC

## 2013-03-08 NOTE — Telephone Encounter (Signed)
Pharmacy Note:  Please re-send and include type of strip and dx code for insurance

## 2013-03-08 NOTE — Telephone Encounter (Signed)
Relevant patient education assigned to patient using Emmi. ° °

## 2013-03-11 ENCOUNTER — Other Ambulatory Visit: Payer: Self-pay | Admitting: Adult Health

## 2013-03-11 ENCOUNTER — Encounter: Payer: Self-pay | Admitting: Podiatry

## 2013-03-11 ENCOUNTER — Telehealth: Payer: Self-pay | Admitting: *Deleted

## 2013-03-11 ENCOUNTER — Ambulatory Visit (INDEPENDENT_AMBULATORY_CARE_PROVIDER_SITE_OTHER): Payer: Medicare HMO | Admitting: Podiatry

## 2013-03-11 ENCOUNTER — Encounter: Payer: Self-pay | Admitting: Adult Health

## 2013-03-11 VITALS — BP 135/66 | HR 72 | Resp 16 | Ht 68.0 in | Wt 210.0 lb

## 2013-03-11 DIAGNOSIS — Z9889 Other specified postprocedural states: Secondary | ICD-10-CM

## 2013-03-11 MED ORDER — METFORMIN HCL 500 MG PO TABS: 1000.0000 mg | ORAL_TABLET | Freq: Two times a day (BID) | ORAL | Status: DC

## 2013-03-11 NOTE — Telephone Encounter (Signed)
Pharmacy Note:  Could you resend Rx and include new qty and directions

## 2013-03-11 NOTE — Progress Notes (Signed)
Matrixectomy is performed to the hallux right is doing well total matrixectomy to the hallux left still painful and raining. He just about finished up with all of his antibiotics. He's questioning whether to continue soaking and antibiotics refilled.  Objective: Vital signs are stable he is alert and oriented x3. Mild erythema surrounding the nailbed this bilateral. No signs of infection.  Assessment: Slowly healing matrixectomy is that they are coming along nicely.  Plan: Discussed etiology pathology conservative versus surgical therapies. We are going to continue our antibiotics and continue soaking once daily in antibacterial soap the other time in Epsom salts and water we will continue our Cortisporin Otic and covered in the day and leave open at night. Followup with me in 2-3 weeks if he is not doing better.

## 2013-03-11 NOTE — Telephone Encounter (Signed)
[employer] approved 4 visits exp on 06/09/13. Auth # [id number]

## 2013-03-11 NOTE — Telephone Encounter (Signed)
Which medication ?

## 2013-03-11 NOTE — Telephone Encounter (Signed)
Glucose blood test strips

## 2013-03-13 ENCOUNTER — Other Ambulatory Visit: Payer: Self-pay | Admitting: *Deleted

## 2013-03-13 MED ORDER — GLUCOSE BLOOD VI STRP: ORAL_STRIP | Status: DC

## 2013-03-13 NOTE — Telephone Encounter (Signed)
Spoke with pt's wife, advised of results and verbalized understanding of increasing the Metformin and recheck in 3 months.

## 2013-03-13 NOTE — Telephone Encounter (Signed)
Resent Rx with directions & quantity

## 2013-04-01 ENCOUNTER — Ambulatory Visit: Payer: Medicare HMO | Admitting: Podiatry

## 2013-04-03 ENCOUNTER — Encounter: Payer: Self-pay | Admitting: Podiatry

## 2013-04-03 ENCOUNTER — Ambulatory Visit (INDEPENDENT_AMBULATORY_CARE_PROVIDER_SITE_OTHER): Payer: Medicare HMO | Admitting: Podiatry

## 2013-04-03 VITALS — BP 122/60 | HR 82 | Resp 16

## 2013-04-03 DIAGNOSIS — Z9889 Other specified postprocedural states: Secondary | ICD-10-CM

## 2013-04-03 NOTE — Progress Notes (Signed)
RIGHT TOENAIL SEEMS TO BE DOING ALL RIGHT and the left toenail seems to be getting there.  Objective: Vital signs are stable he is alert and oriented x3. Pulses are palpable bilateral. Margins of the matrixectomy hallux right appear to be healing quite nicely there is minimal erythema no edema saline is drainage or odor. The total nail avulsion with matrixectomy hallux left demonstrates well-healing surgical toe with a scab.  Assessment: Well-healing surgical hallux bilateral.  Plan: Discontinue soaking watch for signs and symptoms of infection and followup with me if there are any otherwise he may return normal activities.

## 2013-06-06 ENCOUNTER — Ambulatory Visit (INDEPENDENT_AMBULATORY_CARE_PROVIDER_SITE_OTHER): Payer: Commercial Managed Care - HMO | Admitting: Adult Health

## 2013-06-06 ENCOUNTER — Encounter: Payer: Self-pay | Admitting: *Deleted

## 2013-06-06 ENCOUNTER — Encounter: Payer: Self-pay | Admitting: Adult Health

## 2013-06-06 VITALS — BP 120/58 | HR 64 | Temp 98.9°F | Resp 12 | Wt 217.0 lb

## 2013-06-06 DIAGNOSIS — E1149 Type 2 diabetes mellitus with other diabetic neurological complication: Secondary | ICD-10-CM

## 2013-06-06 DIAGNOSIS — I10 Essential (primary) hypertension: Secondary | ICD-10-CM

## 2013-06-06 DIAGNOSIS — E785 Hyperlipidemia, unspecified: Secondary | ICD-10-CM

## 2013-06-06 LAB — BASIC METABOLIC PANEL
BUN: 12 mg/dL (ref 6–23)
CALCIUM: 9.4 mg/dL (ref 8.4–10.5)
CO2: 27 meq/L (ref 19–32)
Chloride: 101 mEq/L (ref 96–112)
Creatinine, Ser: 1 mg/dL (ref 0.4–1.5)
GFR: 78.42 mL/min (ref >60.00)
GLUCOSE: 130 mg/dL — AB (ref 70–99)
POTASSIUM: 4.5 meq/L (ref 3.5–5.1)
SODIUM: 137 meq/L (ref 135–145)

## 2013-06-06 LAB — HEPATIC FUNCTION PANEL
ALBUMIN: 3.8 g/dL (ref 3.5–5.2)
ALK PHOS: 80 U/L (ref 39–117)
ALT: 30 U/L (ref 0–53)
AST: 30 U/L (ref 0–37)
BILIRUBIN DIRECT: 0.1 mg/dL (ref 0.0–0.3)
Total Bilirubin: 0.8 mg/dL (ref 0.3–1.2)
Total Protein: 7 g/dL (ref 6.0–8.3)

## 2013-06-06 LAB — LIPID PANEL
CHOL/HDL RATIO: 4
Cholesterol: 147 mg/dL (ref 0–200)
HDL: 37.9 mg/dL — AB (ref >39.00)
LDL Cholesterol: 61 mg/dL (ref 0–99)
Triglycerides: 243 mg/dL — ABNORMAL HIGH (ref 0.0–149.0)
VLDL: 48.6 mg/dL — ABNORMAL HIGH (ref 0.0–40.0)

## 2013-06-06 LAB — HEMOGLOBIN A1C: Hgb A1c MFr Bld: 6.9 % — ABNORMAL HIGH (ref 4.6–6.5)

## 2013-06-06 NOTE — Progress Notes (Signed)
Pre visit review using our clinic review tool, if applicable. No additional management support is needed unless otherwise documented below in the visit note. 

## 2013-06-06 NOTE — Progress Notes (Signed)
Patient ID: Kevin Choi, male   DOB: [birth year], 73 y.o.   MRN: [id number]    Subjective:    Patient ID: Kevin Choi, male    DOB: April 29, 1940, 73 y.o.   MRN: [id number]  HPI  Kevin Choi is a pleasant 73 y/o male who presents to clinic for follow up DM with neuropathy, HTN, HLD. He is feeling well overall. [facility]. He is walking on the treadmill for 30 min. Denies problems with medications or any side effects.   Past Medical History  Diagnosis Date  . CAD (coronary artery disease) 2002    bypass  . Diabetes mellitus   . HTN (hypertension) 05/08/2007  . HLD (hyperlipidemia)   . Pleural effusion 2007    with pneumothorax   . TIA (transient ischemic attack)     Patient stated 2 months ago  . Peripheral vascular disease   . Headache(784.0)   . Slurred speech   . Nausea with vomiting   . Hyperhidrosis   . Leg pain   . Personal history of urinary calculi   . Hemiplegic migraine, without mention of intractable migraine without mention of status migrainosus   . Elevated prostate specific antigen (PSA) 02/21/2001  . Allergic rhinitis due to pollen   . Occlusion and stenosis of carotid artery without mention of cerebral infarction 09/15/2006  . Restless legs syndrome (RLS) 01/10/2007  . Impotence of organic origin 07/24/2007  . Chicken pox   . Measles   . Mumps   . Peripheral neuropathy 01/31/2008  . Diabetes   . Neuropathy     Current Outpatient Prescriptions on File Prior to Visit  Medication Sig Dispense Refill  . amoxicillin-clavulanate (AUGMENTIN) 500-125 MG per tablet Take 1 tablet (500 mg total) by mouth 3 (three) times daily.  30 tablet  1  . atorvastatin (LIPITOR) 80 MG tablet Take 1 tablet (80 mg total) by mouth daily.  90 tablet  3  . celecoxib (CELEBREX) 200 MG capsule Take 1 capsule (200 mg total) by mouth 2 (two) times daily.  180 capsule  3  . cetirizine (ZYRTEC) 10 MG tablet Take 10 mg by mouth as needed.       .  diphenoxylate-atropine (LOMOTIL) 2.5-0.025 MG per tablet Take 1 tablet by mouth 4 (four) times daily as needed for diarrhea or loose stools.  30 tablet  0  . finasteride (PROSCAR) 5 MG tablet Take 1 tablet (5 mg total) by mouth daily.  90 tablet  3  . furosemide (LASIX) 20 MG tablet Take 1 tablet (20 mg total) by mouth 2 (two) times daily as needed.  180 tablet  3  . gabapentin (NEURONTIN) 100 MG capsule Take 2 capsules (200 mg total) by mouth 4 (four) times daily.  720 capsule  3  . glucose blood test strip Accuchek compact plus. Dx 250.00-Check sugars twice a day  100 each  3  . hydrALAZINE (APRESOLINE) 100 MG tablet Take 1 tablet (100 mg total) by mouth 3 (three) times daily.  270 tablet  3  . magnesium gluconate (MAGONATE) 500 MG tablet Take 500 mg by mouth daily.      . metFORMIN (GLUCOPHAGE) 500 MG tablet Take 2 tablets (1,000 mg total) by mouth 2 (two) times daily with a meal.  180 tablet  3  . metoprolol succinate (TOPROL-XL) 50 MG 24 hr tablet Take one and half tablet daily.  135 tablet  3  . Multiple Vitamins-Minerals (  MULTIVITAL) tablet Take 1 tablet by mouth daily.        . nitroGLYCERIN (NITROSTAT) 0.4 MG SL tablet Place 0.4 mg under the tongue every 5 (five) minutes as needed.        . Omega-3 Fatty Acids (FISH OIL) 1000 MG CAPS Take by mouth 2 (two) times daily.        . pramipexole (MIRAPEX) 0.25 MG tablet Take 1 tablet (0.25 mg total) by mouth 3 (three) times daily.  270 tablet  3  . promethazine (PHENERGAN) 12.5 MG tablet Take 1 tablet (12.5 mg total) by mouth every 8 (eight) hours as needed for nausea.  20 tablet  0  . quinapril (ACCUPRIL) 10 MG tablet Take 1 tablet (10 mg total) by mouth daily.  90 tablet  3  . quinapril (ACCUPRIL) 20 MG tablet Take 1 tablet (20 mg total) by mouth daily.  90 tablet  3  . Rivaroxaban (XARELTO) 20 MG TABS tablet Take 1 tablet (20 mg total) by mouth daily.  90 tablet  3   No current facility-administered medications on file prior to visit.      Review of Systems  Constitutional: Negative.   HENT: Negative.   Eyes: Negative.   Respiratory: Negative.   Cardiovascular: Negative.   Gastrointestinal: Negative.   Endocrine: Negative.   Genitourinary: Negative.   Musculoskeletal: Negative.   Skin: Negative.   Allergic/Immunologic: Negative.   Neurological: Negative.   Hematological: Negative.   Psychiatric/Behavioral: Negative.        Objective:  BP 120/58  Pulse 64  Temp(Src) 98.9 F (37.2 C) (Oral)  Wt 217 lb (98.431 kg)  SpO2 94%   Physical Exam  Constitutional: He is oriented to person, place, and time. He appears well-developed and well-nourished. No distress.  HENT:  Head: Normocephalic and atraumatic.  Eyes: Conjunctivae and EOM are normal.  Neck: Neck supple.  Cardiovascular: Normal rate, regular rhythm, normal heart sounds and intact distal pulses.  Exam reveals no gallop.   No murmur heard. Pulmonary/Chest: Effort normal and breath sounds normal. No respiratory distress. He has no wheezes. He has no rales.  Musculoskeletal: Normal range of motion.  Neurological: He is alert and oriented to person, place, and time. Coordination normal.  Skin: Skin is warm and dry.  Psychiatric: He has a normal mood and affect. His behavior is normal. Judgment and thought content normal.          Assessment & Plan:   1. Diabetes with neurologic complications Compliant with meds. On neurontin with improvement of symptoms. Check labs. Continue to follow - Hemoglobin A1c; Future - Hemoglobin A1c  2. HLD (hyperlipidemia) On statin. Check lipids and liver function. Continue to follow - Hepatic function panel - Lipid panel  3. HTN (hypertension) Well controlled. Check labs. Continue to follow - Basic metabolic panel

## 2013-06-28 ENCOUNTER — Telehealth: Payer: Self-pay | Admitting: Adult Health

## 2013-10-03 ENCOUNTER — Encounter (INDEPENDENT_AMBULATORY_CARE_PROVIDER_SITE_OTHER): Payer: Commercial Managed Care - HMO

## 2013-10-03 DIAGNOSIS — I6529 Occlusion and stenosis of unspecified carotid artery: Secondary | ICD-10-CM

## 2013-10-03 DIAGNOSIS — Z9889 Other specified postprocedural states: Secondary | ICD-10-CM

## 2013-10-03 DIAGNOSIS — I6523 Occlusion and stenosis of bilateral carotid arteries: Secondary | ICD-10-CM

## 2013-10-16 ENCOUNTER — Ambulatory Visit: Payer: Medicare HMO | Admitting: Cardiovascular Disease

## 2013-10-23 ENCOUNTER — Ambulatory Visit: Payer: Self-pay | Admitting: General Practice

## 2013-10-23 LAB — BASIC METABOLIC PANEL
Anion Gap: 7 (ref 7–16)
BUN: 18 mg/dL (ref 7–18)
CALCIUM: 9.2 mg/dL (ref 8.5–10.1)
Chloride: 106 mmol/L (ref 98–107)
Co2: 26 mmol/L (ref 21–32)
Creatinine: 0.98 mg/dL (ref 0.60–1.30)
Glucose: 132 mg/dL — ABNORMAL HIGH (ref 65–99)
Osmolality: 281 (ref 275–301)
POTASSIUM: 4 mmol/L (ref 3.5–5.1)
SODIUM: 139 mmol/L (ref 136–145)

## 2013-10-23 LAB — CBC
HCT: 41.5 % (ref 40.0–52.0)
HGB: 13.2 g/dL (ref 13.0–18.0)
MCH: 28.8 pg (ref 26.0–34.0)
MCHC: 31.9 g/dL — ABNORMAL LOW (ref 32.0–36.0)
MCV: 90 fL (ref 80–100)
Platelet: 206 10*3/uL (ref 150–440)
RBC: 4.61 10*6/uL (ref 4.40–5.90)
RDW: 14.7 % — ABNORMAL HIGH (ref 11.5–14.5)
WBC: 7.9 10*3/uL (ref 3.8–10.6)

## 2013-10-23 LAB — URINALYSIS, COMPLETE
Bacteria: NONE SEEN
Bilirubin,UR: NEGATIVE
Blood: NEGATIVE
Glucose,UR: NEGATIVE mg/dL (ref 0–75)
KETONE: NEGATIVE
Leukocyte Esterase: NEGATIVE
NITRITE: NEGATIVE
PH: 6 (ref 4.5–8.0)
Protein: NEGATIVE
Specific Gravity: 1.021 (ref 1.003–1.030)
WBC UR: 1 /HPF (ref 0–5)

## 2013-10-23 LAB — APTT: ACTIVATED PTT: 31.9 s (ref 23.6–35.9)

## 2013-10-23 LAB — PROTIME-INR
INR: 1
PROTHROMBIN TIME: 13.4 s (ref 11.5–14.7)

## 2013-10-23 LAB — SEDIMENTATION RATE: ERYTHROCYTE SED RATE: 13 mm/h (ref 0–20)

## 2013-10-23 LAB — MRSA PCR SCREENING

## 2013-10-25 LAB — URINE CULTURE

## 2013-10-31 ENCOUNTER — Ambulatory Visit (INDEPENDENT_AMBULATORY_CARE_PROVIDER_SITE_OTHER): Payer: Commercial Managed Care - HMO | Admitting: Cardiovascular Disease

## 2013-10-31 ENCOUNTER — Encounter: Payer: Self-pay | Admitting: Cardiovascular Disease

## 2013-10-31 VITALS — BP 120/70 | HR 68 | Ht 68.5 in | Wt 219.5 lb

## 2013-10-31 DIAGNOSIS — E119 Type 2 diabetes mellitus without complications: Secondary | ICD-10-CM

## 2013-10-31 DIAGNOSIS — I10 Essential (primary) hypertension: Secondary | ICD-10-CM | POA: Diagnosis not present

## 2013-10-31 DIAGNOSIS — Z01810 Encounter for preprocedural cardiovascular examination: Secondary | ICD-10-CM

## 2013-10-31 DIAGNOSIS — Z0000 Encounter for general adult medical examination without abnormal findings: Secondary | ICD-10-CM | POA: Insufficient documentation

## 2013-10-31 DIAGNOSIS — I2510 Atherosclerotic heart disease of native coronary artery without angina pectoris: Secondary | ICD-10-CM

## 2013-10-31 DIAGNOSIS — G459 Transient cerebral ischemic attack, unspecified: Secondary | ICD-10-CM

## 2013-10-31 DIAGNOSIS — E785 Hyperlipidemia, unspecified: Secondary | ICD-10-CM

## 2013-10-31 NOTE — Assessment & Plan Note (Signed)
Cholesterol is at goal on the current lipid regimen. No changes to the medications were made.  

## 2013-10-31 NOTE — Assessment & Plan Note (Signed)
Anticoagulation has been held in preparation for surgery. He is indicated he will be on Lovenox following the procedure

## 2013-10-31 NOTE — Assessment & Plan Note (Signed)
Blood pressure is well controlled on today's visit. No changes made to the medications. 

## 2013-10-31 NOTE — Assessment & Plan Note (Signed)
We have encouraged continued exercise, careful diet management in an effort to lose weight. 

## 2013-10-31 NOTE — Assessment & Plan Note (Signed)
Settable risk for upcoming total knee replacement surgery. Anticoagulation has been held in preparation for epidural.  Would recommend minimizing IV fluids in the perioperative and postoperative period as he does have a high risk of developing diastolic CHF.

## 2013-10-31 NOTE — Progress Notes (Signed)
Patient ID: Kevin Choi, male    DOB: May 29, 1940, 73 y.o.   MRN: [id number]  HPI Comments: 73 year old gentleman with a history of coronary artery disease, bypass surgery with a LIMA to the LAD, vein graft to the diagonal, diabetes, hypertension, hyperlipidemia, history of pleural effusion requiring a VATS, carotid endarterectomy on the right who presents for routine followup. He has had TIA symptoms 3 times, once in 2002, October 2012 in March 2013.  In followup today, he reports that he is doing well. He is active, denies any significant chest pain or shortness of breath. He is scheduled for total knee replacement on the left next week with Dr. Marry Guan. He has stopped his Xarelto today as she reports she will need a epidural. Surgery scheduled for September 16 Otherwise has been active but limited by his chronic knee pain   Blood pressures ranging in the 120-140 range. Hemoglobin A1c 6.3. Lipids are well controlled.   May 14 2011  he had acute onset of slurred speech. He was playing cards shortly after and he was unable to do any math. Symptoms lasted for approximately one to 2 hours. Blood pressure prior to this event had been well controlled though during and after the event, he had severe hypertension with systolic pressures sometimes greater than 200. He was in the emergency room for 2 days with extensive testing.  carotid ultrasound that showed no hemodynamically significant stenosis Head CT scan showing no acute stroke MRI of the head showing no infarction. Stable appearing area of enhancement of the right temporal lobe most compatible with meningioma Chest x-ray was essentially normal  Stress test April 2009 shows no significant ischemia. This was a pharmacologic study.  EKG shows normal sinus rhythm with rate 57 beats per minute with no significant ST or T wave changes   Outpatient Encounter Prescriptions as of 10/31/2013  Medication Sig  . atorvastatin (LIPITOR) 80 MG tablet Take  1 tablet (80 mg total) by mouth daily.  . celecoxib (CELEBREX) 200 MG capsule Take 1 capsule (200 mg total) by mouth 2 (two) times daily.  . cetirizine (ZYRTEC) 10 MG tablet Take 10 mg by mouth as needed.   . diphenoxylate-atropine (LOMOTIL) 2.5-0.025 MG per tablet Take 1 tablet by mouth 4 (four) times daily as needed for diarrhea or loose stools.  . finasteride (PROSCAR) 5 MG tablet Take 1 tablet (5 mg total) by mouth daily.  . furosemide (LASIX) 20 MG tablet Take 1 tablet (20 mg total) by mouth 2 (two) times daily as needed.  . gabapentin (NEURONTIN) 100 MG capsule Take 2 capsules (200 mg total) by mouth 4 (four) times daily.  [name] glucose blood test strip Accuchek compact plus. Dx 250.00-Check sugars twice a day  . hydrALAZINE (APRESOLINE) 100 MG tablet Take 1 tablet (100 mg total) by mouth 3 (three) times daily.  . magnesium gluconate (MAGONATE) 500 MG tablet Take 500 mg by mouth daily.  . metFORMIN (GLUCOPHAGE) 500 MG tablet Take 2 tablets (1,000 mg total) by mouth 2 (two) times daily with a meal.  . metoprolol succinate (TOPROL-XL) 50 MG 24 hr tablet Take one and half tablet daily.  . Multiple Vitamins-Minerals (MULTIVITAL) tablet Take 1 tablet by mouth daily.    . nitroGLYCERIN (NITROSTAT) 0.4 MG SL tablet Place 0.4 mg under the tongue every 5 (five) minutes as needed.    . Omega-3 Fatty Acids (FISH OIL) 1000 MG CAPS Take by mouth 2 (two) times daily.    . pramipexole (MIRAPEX) 0.25  MG tablet Take 1 tablet (0.25 mg total) by mouth 3 (three) times daily.  . promethazine (PHENERGAN) 12.5 MG tablet Take 1 tablet (12.5 mg total) by mouth every 8 (eight) hours as needed for nausea.  . quinapril (ACCUPRIL) 10 MG tablet Take 1 tablet (10 mg total) by mouth daily.  . quinapril (ACCUPRIL) 20 MG tablet Take 1 tablet (20 mg total) by mouth daily.  . Rivaroxaban (XARELTO) 20 MG TABS tablet Take 1 tablet (20 mg total) by mouth daily.    Review of Systems  Constitutional: Negative.   HENT: Negative.    Eyes: Negative.   Respiratory: Negative.   Cardiovascular: Negative.   Gastrointestinal: Negative.   Endocrine: Negative.   Musculoskeletal: Positive for back pain.  Skin: Negative.   Allergic/Immunologic: Negative.   Neurological: Negative.   Hematological: Negative.   Psychiatric/Behavioral: Negative.   All other systems reviewed and are negative.  BP 120/70  Pulse 68  Ht 5' 8.5" (1.74 m)  Wt 219 lb 8 oz (99.565 kg)  BMI 32.89 kg/m2  Physical Exam  Nursing note and vitals reviewed. Constitutional: He is oriented to person, place, and time. He appears well-developed and well-nourished.  HENT:  Head: Normocephalic.  Nose: Nose normal.  Mouth/Throat: Oropharynx is clear and moist.  Eyes: Conjunctivae are normal. Pupils are equal, round, and reactive to light.  Neck: Normal range of motion. Neck supple. No JVD present.  Cardiovascular: Normal rate, regular rhythm, S1 normal, S2 normal, normal heart sounds and intact distal pulses.  Exam reveals no gallop and no friction rub.   No murmur heard. Trace edema above the sock line, pitting  Pulmonary/Chest: Effort normal and breath sounds normal. No respiratory distress. He has no wheezes. He has no rales. He exhibits no tenderness.  Abdominal: Soft. Bowel sounds are normal. He exhibits no distension. There is no tenderness.  Musculoskeletal: Normal range of motion. He exhibits no edema and no tenderness.  Lymphadenopathy:    He has no cervical adenopathy.  Neurological: He is alert and oriented to person, place, and time. Coordination normal.  Skin: Skin is warm and dry. No rash noted. No erythema.  Psychiatric: He has a normal mood and affect. His behavior is normal. Judgment and thought content normal.      Assessment and Plan

## 2013-10-31 NOTE — Assessment & Plan Note (Signed)
Currently with no symptoms of angina. No further workup at this time. Continue current medication regimen. 

## 2013-10-31 NOTE — Patient Instructions (Signed)
You are doing well. No medication changes were made.  Please call us if you have new issues that need to be addressed before your next appt.  Your physician wants you to follow-up in: 6 months.  You will receive a reminder letter in the mail two months in advance. If you don't receive a letter, please call our office to schedule the follow-up appointment.   

## 2013-11-06 ENCOUNTER — Inpatient Hospital Stay: Payer: Self-pay

## 2013-11-06 LAB — CREATININE, SERUM
CREATININE: 1.02 mg/dL (ref 0.60–1.30)
EGFR (African American): >60
EGFR (Non-African Amer.): >60

## 2013-11-06 LAB — HEMOGLOBIN: HGB: 12.2 g/dL — ABNORMAL LOW (ref 13.0–18.0)

## 2013-11-07 LAB — BASIC METABOLIC PANEL
Anion Gap: 9 (ref 7–16)
BUN: 11 mg/dL (ref 7–18)
Calcium, Total: 8.4 mg/dL — ABNORMAL LOW (ref 8.5–10.1)
Chloride: 103 mmol/L (ref 98–107)
Co2: 22 mmol/L (ref 21–32)
Creatinine: 0.95 mg/dL (ref 0.60–1.30)
EGFR (Non-African Amer.): >60
Glucose: 155 mg/dL — ABNORMAL HIGH (ref 65–99)
Osmolality: 271 (ref 275–301)
Potassium: 4.8 mmol/L (ref 3.5–5.1)
Sodium: 134 mmol/L — ABNORMAL LOW (ref 136–145)

## 2013-11-07 LAB — PLATELET COUNT: Platelet: 190 10*3/uL (ref 150–440)

## 2013-11-07 LAB — HEMOGLOBIN: HGB: 12.2 g/dL — ABNORMAL LOW (ref 13.0–18.0)

## 2013-11-08 LAB — HEMOGLOBIN: HGB: 11.6 g/dL — ABNORMAL LOW (ref 13.0–18.0)

## 2013-11-08 LAB — BASIC METABOLIC PANEL
Anion Gap: 9 (ref 7–16)
BUN: 11 mg/dL (ref 7–18)
CO2: 24 mmol/L (ref 21–32)
Calcium, Total: 8.7 mg/dL (ref 8.5–10.1)
Chloride: 102 mmol/L (ref 98–107)
Creatinine: 1.02 mg/dL (ref 0.60–1.30)
EGFR (Non-African Amer.): >60
Glucose: 140 mg/dL — ABNORMAL HIGH (ref 65–99)
OSMOLALITY: 272 (ref 275–301)
Potassium: 4 mmol/L (ref 3.5–5.1)
SODIUM: 135 mmol/L — AB (ref 136–145)

## 2013-11-08 LAB — PLATELET COUNT: Platelet: 196 10*3/uL (ref 150–440)

## 2013-11-09 LAB — BASIC METABOLIC PANEL
ANION GAP: 10 (ref 7–16)
BUN: 17 mg/dL (ref 7–18)
Calcium, Total: 8.6 mg/dL (ref 8.5–10.1)
Chloride: 102 mmol/L (ref 98–107)
Co2: 26 mmol/L (ref 21–32)
Creatinine: 1.05 mg/dL (ref 0.60–1.30)
GLUCOSE: 130 mg/dL — AB (ref 65–99)
Osmolality: 279 (ref 275–301)
POTASSIUM: 4 mmol/L (ref 3.5–5.1)
Sodium: 138 mmol/L (ref 136–145)

## 2013-11-09 LAB — CBC WITH DIFFERENTIAL/PLATELET
Basophil #: 0 10*3/uL (ref 0.0–0.1)
Basophil %: 0.2 %
EOS PCT: 1.5 %
Eosinophil #: 0.2 10*3/uL (ref 0.0–0.7)
HCT: 34.4 % — ABNORMAL LOW (ref 40.0–52.0)
HGB: 11.4 g/dL — AB (ref 13.0–18.0)
Lymphocyte #: 2.5 10*3/uL (ref 1.0–3.6)
Lymphocyte %: 22.2 %
MCH: 29.4 pg (ref 26.0–34.0)
MCHC: 33.3 g/dL (ref 32.0–36.0)
MCV: 88 fL (ref 80–100)
Monocyte #: 1.4 x10 3/mm — ABNORMAL HIGH (ref 0.2–1.0)
Monocyte %: 12.4 %
Neutrophil #: 7 10*3/uL — ABNORMAL HIGH (ref 1.4–6.5)
Neutrophil %: 63.7 %
Platelet: 199 10*3/uL (ref 150–440)
RBC: 3.89 10*6/uL — AB (ref 4.40–5.90)
RDW: 15.1 % — ABNORMAL HIGH (ref 11.5–14.5)
WBC: 11.1 10*3/uL — ABNORMAL HIGH (ref 3.8–10.6)

## 2013-12-09 ENCOUNTER — Other Ambulatory Visit: Payer: Self-pay | Admitting: *Deleted

## 2013-12-09 ENCOUNTER — Telehealth: Payer: Self-pay | Admitting: *Deleted

## 2013-12-09 MED ORDER — GLUCOSE BLOOD VI STRP: ORAL_STRIP | Status: DC

## 2013-12-09 NOTE — Telephone Encounter (Signed)
Patient called wanting samples of Xarelto. Please call patient when ready for pick up.

## 2013-12-09 NOTE — Telephone Encounter (Signed)
Need ICD 10 for test strips. Rx sent to pharmacy by escript

## 2013-12-10 NOTE — Telephone Encounter (Signed)
Samples Xarelto 20 mg @ front desk for pick up.

## 2014-02-04 ENCOUNTER — Other Ambulatory Visit: Payer: Self-pay | Admitting: Family Medicine

## 2014-03-18 ENCOUNTER — Ambulatory Visit: Payer: Self-pay | Admitting: General Practice

## 2014-03-18 ENCOUNTER — Telehealth: Payer: Self-pay | Admitting: Cardiovascular Disease

## 2014-03-18 LAB — URINALYSIS, COMPLETE
Bacteria: NONE SEEN
Bilirubin,UR: NEGATIVE
Glucose,UR: NEGATIVE mg/dL (ref 0–75)
Ketone: NEGATIVE
NITRITE: NEGATIVE
PH: 7 (ref 4.5–8.0)
PROTEIN: NEGATIVE
RBC,UR: 33 /HPF (ref 0–5)
SPECIFIC GRAVITY: 1.017 (ref 1.003–1.030)
Squamous Epithelial: NONE SEEN
WBC UR: 76 /HPF (ref 0–5)

## 2014-03-18 LAB — CBC
HCT: 41.9 % (ref 40.0–52.0)
HGB: 13.8 g/dL (ref 13.0–18.0)
MCH: 27.8 pg (ref 26.0–34.0)
MCHC: 32.9 g/dL (ref 32.0–36.0)
MCV: 84 fL (ref 80–100)
Platelet: 241 10*3/uL (ref 150–440)
RBC: 4.96 10*6/uL (ref 4.40–5.90)
RDW: 15.2 % — ABNORMAL HIGH (ref 11.5–14.5)
WBC: 8.8 10*3/uL (ref 3.8–10.6)

## 2014-03-18 LAB — APTT: ACTIVATED PTT: 35.2 s (ref 23.6–35.9)

## 2014-03-18 LAB — SEDIMENTATION RATE: Erythrocyte Sed Rate: 34 mm/hr — ABNORMAL HIGH (ref 0–20)

## 2014-03-18 LAB — BASIC METABOLIC PANEL
ANION GAP: 8 (ref 7–16)
BUN: 14 mg/dL (ref 7–18)
CREATININE: 0.9 mg/dL (ref 0.60–1.30)
Calcium, Total: 9.3 mg/dL (ref 8.5–10.1)
Chloride: 104 mmol/L (ref 98–107)
Co2: 28 mmol/L (ref 21–32)
EGFR (African American): >60
GLUCOSE: 149 mg/dL — AB (ref 65–99)
OSMOLALITY: 283 (ref 275–301)
POTASSIUM: 4.3 mmol/L (ref 3.5–5.1)
Sodium: 140 mmol/L (ref 136–145)

## 2014-03-18 LAB — PROTIME-INR
INR: 1.1
Prothrombin Time: 13.6 secs (ref 11.5–14.7)

## 2014-03-18 LAB — HEMOGLOBIN A1C: Hemoglobin A1C: 7.3 % — ABNORMAL HIGH (ref 4.2–6.3)

## 2014-03-18 LAB — MRSA PCR SCREENING

## 2014-03-18 NOTE — Telephone Encounter (Signed)
Pre-admit needs to know if patient is suppose to be taking 100mg  on HydrALAZINE. For patient has been taking only 25 mg 3 times a day. Please call

## 2014-03-18 NOTE — Telephone Encounter (Signed)
Spoke w/ Baker Janus.  Advised her that our records indicate that pt has been on hydralazine 100 mg TID since 2014.  Asked her to call back w/ any further questions or concerns.

## 2014-03-20 LAB — URINE CULTURE

## 2014-04-02 ENCOUNTER — Inpatient Hospital Stay: Payer: Self-pay | Admitting: General Practice

## 2014-05-20 ENCOUNTER — Encounter: Payer: Self-pay | Admitting: Cardiovascular Disease

## 2014-05-20 ENCOUNTER — Ambulatory Visit (INDEPENDENT_AMBULATORY_CARE_PROVIDER_SITE_OTHER): Payer: PPO | Admitting: Cardiovascular Disease

## 2014-05-20 VITALS — BP 110/60 | HR 75 | Ht 68.0 in | Wt 199.5 lb

## 2014-05-20 DIAGNOSIS — E118 Type 2 diabetes mellitus with unspecified complications: Secondary | ICD-10-CM | POA: Diagnosis not present

## 2014-05-20 DIAGNOSIS — E785 Hyperlipidemia, unspecified: Secondary | ICD-10-CM

## 2014-05-20 DIAGNOSIS — I25119 Atherosclerotic heart disease of native coronary artery with unspecified angina pectoris: Secondary | ICD-10-CM

## 2014-05-20 DIAGNOSIS — G459 Transient cerebral ischemic attack, unspecified: Secondary | ICD-10-CM | POA: Diagnosis not present

## 2014-05-20 DIAGNOSIS — I10 Essential (primary) hypertension: Secondary | ICD-10-CM

## 2014-05-20 DIAGNOSIS — I6523 Occlusion and stenosis of bilateral carotid arteries: Secondary | ICD-10-CM

## 2014-05-20 NOTE — Assessment & Plan Note (Signed)
Cholesterol is at goal on the current lipid regimen. No changes to the medications were made.  

## 2014-05-20 NOTE — Assessment & Plan Note (Signed)
Blood pressure in the office today is running low. He has not been checking at home. Possible episodes of orthostasis at home. Recommended he monitor his blood pressure at home particularly late morning, afternoons. If blood pressures running low, would recommend he decrease the hydralazine down to 50 mg 3 times a day

## 2014-05-20 NOTE — Assessment & Plan Note (Signed)
Currently with no symptoms of angina. No further workup at this time. Continue current medication regimen. 

## 2014-05-20 NOTE — Assessment & Plan Note (Signed)
We have encouraged continued exercise, careful diet management in an effort to lose weight. Most recent hemoglobin A1c 6.8

## 2014-05-20 NOTE — Patient Instructions (Addendum)
You are doing well.  If blood pressure runs low, Please cut the hydralazine in 1/2 three times a day   Please call us if you have new issues that need to be addressed before your next appt.  Your physician wants you to follow-up in: 6 months.  You will receive a reminder letter in the mail two months in advance. If you don't receive a letter, please call our office to schedule the follow-up appointment.

## 2014-05-20 NOTE — Progress Notes (Signed)
Patient ID: Kevin Choi, male    DOB: February 14, 1941, 74 y.o.   MRN: [id number]  HPI Comments: 74 year old gentleman with a history of coronary artery disease, bypass surgery with a LIMA to the LAD, vein graft to the diagonal, diabetes, hypertension, hyperlipidemia, history of pleural effusion requiring a VATS, carotid endarterectomy on the right who presents for routine followup of his coronary artery disease . He has had TIA symptoms 3 times, once in 2002, October 2012 in March 2013. Status post bilateral total knee replacements  In follow-up today, he reports that he is recovering from bilateral knee replacements. He has completed physical therapy. He denies any significant consultations He has lost 20 pounds over the course of the past several months.  Has not been monitoring his blood pressure at home Denies any chest pain, shortness of breath, leg edema  Review of his lab work from and of January 2016 shows cholesterol at goal, hemoglobin A1c 6.8  EKG on today's visit shows normal sinus rhythm with rate 73 bpm, nonspecific ST abnormality  Other past medical history  May 14 2011  he had acute onset of slurred speech. He was playing cards shortly after and he was unable to do any math. Symptoms lasted for approximately one to 2 hours. Blood pressure prior to this event had been well controlled though during and after the event, he had severe hypertension with systolic pressures sometimes greater than 200. He was in the emergency room for 2 days with extensive testing.  carotid ultrasound that showed no hemodynamically significant stenosis Head CT scan showing no acute stroke MRI of the head showing no infarction. Stable appearing area of enhancement of the right temporal lobe most compatible with meningioma Chest x-ray was essentially normal  Stress test April 2009 shows no significant ischemia. This was a pharmacologic study.  Allergies  Allergen Reactions  . Other Nausea Only     Contrast Dye-drops blood pressure.    Outpatient Encounter Prescriptions as of 05/20/2014  Medication Sig  . atorvastatin (LIPITOR) 80 MG tablet Take 1 tablet (80 mg total) by mouth daily.  . celecoxib (CELEBREX) 200 MG capsule Take 1 capsule (200 mg total) by mouth 2 (two) times daily.  . cetirizine (ZYRTEC) 10 MG tablet Take 10 mg by mouth as needed.   . diphenoxylate-atropine (LOMOTIL) 2.5-0.025 MG per tablet Take 1 tablet by mouth 4 (four) times daily as needed for diarrhea or loose stools.  . finasteride (PROSCAR) 5 MG tablet Take 1 tablet (5 mg total) by mouth daily.  . furosemide (LASIX) 20 MG tablet Take 1 tablet (20 mg total) by mouth 2 (two) times daily as needed.  . gabapentin (NEURONTIN) 100 MG capsule Take 2 capsules (200 mg total) by mouth 4 (four) times daily.  [name] glucose blood test strip Accuchek compact plus. Dx E11.9 -Check sugars twice a day  . hydrALAZINE (APRESOLINE) 100 MG tablet Take 1 tablet (100 mg total) by mouth 3 (three) times daily.  . magnesium gluconate (MAGONATE) 500 MG tablet Take 500 mg by mouth daily.  . metFORMIN (GLUCOPHAGE) 500 MG tablet Take 2 tablets (1,000 mg total) by mouth 2 (two) times daily with a meal.  . metoprolol succinate (TOPROL-XL) 50 MG 24 hr tablet Take one and half tablet daily.  . Multiple Vitamins-Minerals (MULTIVITAL) tablet Take 1 tablet by mouth daily.    . nitroGLYCERIN (NITROSTAT) 0.4 MG SL tablet Place 0.4 mg under the tongue every 5 (five) minutes as needed.    . Omega-3  Fatty Acids (FISH OIL) 1000 MG CAPS Take by mouth 2 (two) times daily.    . pramipexole (MIRAPEX) 0.25 MG tablet Take 1 tablet (0.25 mg total) by mouth 3 (three) times daily.  . promethazine (PHENERGAN) 12.5 MG tablet Take 1 tablet (12.5 mg total) by mouth every 8 (eight) hours as needed for nausea.  . quinapril (ACCUPRIL) 10 MG tablet Take 1 tablet (10 mg total) by mouth daily.  . quinapril (ACCUPRIL) 20 MG tablet Take 1 tablet (20 mg total) by mouth daily.  .  Rivaroxaban (XARELTO) 20 MG TABS tablet Take 1 tablet (20 mg total) by mouth daily.    Past Medical History  Diagnosis Date  . CAD (coronary artery disease) 2002    bypass  . Diabetes mellitus   . HTN (hypertension) 05/08/2007  . HLD (hyperlipidemia)   . Pleural effusion 2007    with pneumothorax   . TIA (transient ischemic attack)     Patient stated 2 months ago  . Peripheral vascular disease   . Headache(784.0)   . Slurred speech   . Nausea with vomiting   . Hyperhidrosis   . Leg pain   . Personal history of urinary calculi   . Hemiplegic migraine, without mention of intractable migraine without mention of status migrainosus   . Elevated prostate specific antigen (PSA) 02/21/2001  . Allergic rhinitis due to pollen   . Occlusion and stenosis of carotid artery without mention of cerebral infarction 09/15/2006  . Restless legs syndrome (RLS) 01/10/2007  . Impotence of organic origin 07/24/2007  . Chicken pox   . Measles   . Mumps   . Peripheral neuropathy 01/31/2008  . Diabetes   . Neuropathy     Past Surgical History  Procedure Laterality Date  . Coronary artery bypass graft  2001  . Carotid endarterectomy  2001    right  . Collapse lung  2007  . Parotid gland tumor excision  1973  . Lung surgery  2007    Left; decortication left lung for persistant pleural effusion and PTX. admission ten [facility] at [facility].  . Vasectomy    . Cholecystectomy  2003  . Knee surgery Left 1981    Social History  reports that he has quit smoking. His smoking use included Cigarettes. He has a 20 pack-year smoking history. He has never used smokeless tobacco. He reports that he does not drink alcohol or use illicit drugs.  Family History family history includes Angina in his father; Anxiety disorder in his brother; Cancer in his father and other; Colon polyps in his sister; Coronary artery disease in his other; Emphysema in his father; Heart attack in his brother and mother; Heart disease in  his maternal aunt; Heart failure in his father; Lymphoma in his sister.  Review of Systems  Constitutional: Negative.   Respiratory: Negative.   Cardiovascular: Negative.   Gastrointestinal: Negative.   Musculoskeletal: Positive for back pain.  Skin: Negative.   Neurological: Negative.   Hematological: Negative.   Psychiatric/Behavioral: Negative.   All other systems reviewed and are negative.  BP 110/60 mmHg  Pulse 75  Ht 5\' 8"  (1.727 m)  Wt 199 lb 8 oz (90.493 kg)  BMI 30.34 kg/m2  Physical Exam  Constitutional: He is oriented to person, place, and time. He appears well-developed and well-nourished.  HENT:  Head: Normocephalic.  Nose: Nose normal.  Mouth/Throat: Oropharynx is clear and moist.  Eyes: Conjunctivae are normal. Pupils are equal, round, and reactive to light.  Neck: Normal  range of motion. Neck supple. No JVD present.  Cardiovascular: Normal rate, regular rhythm, S1 normal, S2 normal, normal heart sounds and intact distal pulses.  Exam reveals no gallop and no friction rub.   No murmur heard. Pulmonary/Chest: Effort normal and breath sounds normal. No respiratory distress. He has no wheezes. He has no rales. He exhibits no tenderness.  Abdominal: Soft. Bowel sounds are normal. He exhibits no distension. There is no tenderness.  Musculoskeletal: Normal range of motion. He exhibits no edema or tenderness.  Lymphadenopathy:    He has no cervical adenopathy.  Neurological: He is alert and oriented to person, place, and time. Coordination normal.  Skin: Skin is warm and dry. No rash noted. No erythema.  Psychiatric: He has a normal mood and affect. His behavior is normal. Judgment and thought content normal.      Assessment and Plan   Nursing note and vitals reviewed.

## 2014-05-20 NOTE — Assessment & Plan Note (Signed)
History of carotid endarterectomy on the right. Ultrasound in 2015 showing stable 40% stenosis on the left Repeat ultrasound in 2017

## 2014-05-20 NOTE — Assessment & Plan Note (Signed)
No recent TIA or stroke symptoms. We'll continue anticoagulation

## 2014-06-14 NOTE — Discharge Summary (Signed)
PATIENT NAME:  Kevin Choi, Kevin Choi MR#:  [id number] DATE OF BIRTH:  01/16/1941  DATE OF ADMISSION:  11/06/2013 DATE OF DISCHARGE:  11/09/2013  ADMITTING DIAGNOSIS: Left knee degenerative arthritis.   DISCHARGE DIAGNOSIS: Left knee degenerative arthritis.   PROCEDURE PERFORMED: Left total knee arthroplasty using computer-assisted navigation.   SURGEON: Laurice Record. Holley Bouche., MD.   Terrence DupontRonney Asters, PA-C, required to maintain retraction throughout the procedure.   ANESTHESIA: Spinal.   ESTIMATED BLOOD LOSS: 50 mL.    FLUIDS REPLACED: 1100 of crystalloid.   TOURNIQUET TIME: 97 minutes.   DRAINS: Two medium drains to reinfusion system.   SOFT TISSUE RELEASES: Anterior cruciate ligament, posterior cruciate ligament, deep and superficial medial collateral ligament and patellofemoral ligament.   IMPLANTS UTILIZED: DePuy PFC Sigma size 4 posterior stabilized femoral component, size 4 MBT tibial component, a 38 mm 3-peg oval dome patella, and a 10 mm stabilized rotating platform polyethylene insert.   HISTORY: The patient is a 74 year old who has a long history of progressive bilateral knee pain with the left knee more symptomatic than the right. Most of the pain is localized along the medial joint line. He has pain with weight-bearing activity that aggravates the knee. He does report occasional activity-related swelling of the knee, as well as the knee giving way. He denies any locking of the knee. He is having significant pain despite use of ibuprofen, corticosteroid injections, Synvisc injections and activity modification. He is not currently using any ambulatory aids. Pain has progressed to the point that it is significantly interfering with his activities of daily living.   PHYSICAL EXAMINATION: GENERAL: Well-developed, well-nourished male in no apparent distress. Varus thrust to both knees.  HEENT: Atraumatic, normocephalic. Pupils equal, round, and reactive to light.  NECK: Supple,  nontender, with good range of motion.  LUNGS: Clear to auscultation bilaterally.  CARDIOVASCULAR: Regular rate and rhythm. Normal S1, S2.  ABDOMEN: Soft, nontender, nondistended.  EXTREMITIES: Good strength, stability, and range of motion of the upper extremities. Left knee, soft tissue swelling is mild; effusion, none; erythema, none; crepitant, moderate. There is mild tenderness along the medial joint line with relative varus alignment. He has medial pseudolaxity. He has good tracking of the patella to the patellofemoral groove. There is no quadriceps atrophy. Range of motion is 0 to 120 degrees. He is neurovascularly intact to the left lower extremity.   HOSPITAL COURSE: The patient was admitted to the hospital on 11/06/2013. He had surgery that same day and was brought to the orthopedic floor from the PACU in stable condition. On postoperative day 1, the patient had acute postoperative blood loss anemia with a hemoglobin of 12.2. Other vital signs are stable. The patient's pain was controlled. Later on in the day on postoperative day #1, the patient did have episodes of hypertension. Systolic blood pressure was above 200. The patient was asymptomatic. A medicine consult was ordered and it was determined that this was likely due to pain and fluid overload. He was given Lasix and hydralazine p.r.n. His blood pressure did improve significantly. On postoperative day 2 and 3, blood pressure continued to improve, although it did elevate on 11/09/2013 one time during physical therapy. The patient made excellent progress with physical therapy. He met all goals and was able to be discharged to home with home health PT on postoperative day 3.   CONDITION AT DISCHARGE: Stable.   DISCHARGE INSTRUCTIONS: The patient may gradually increase weight-bearing on the affected extremity. Elevate the affected foot or leg  on 1 or 2 pillows with the foot higher than the knee. Thigh-high TED hose on both legs and remove at  bedtime; replace when arising the next morning. Elevate the heels off the bed. Use incentive spirometer every hour while awake. Encourage cough and deep breathing. No concentrated sweets or sugar. Diabetic diet as tolerated. Apply an ice pack to the affected area. Do not get the dressing or bandage wet or dirty. Call [facility] orthopedics if the dressing gets water under it. Leave the dressing on. Call [facility] orth[facility] if any of the following occur: Bright red bleeding from the incision wound, fever above 101.5 degrees, redness, swelling, or drainage at the incision site. Call [facility] orthopedics if you experience any increased leg pain, numbness or weakness in your legs or bowel or bladder symptoms. Home physical therapy has been arranged for continuation of rehab program. Please call [facility] if a therapist has not contacted you within 48 hours of your return home. Call [place] for a followup appointment at [phone]. Followup appointment is 11/21/2013 at 8:45.   DISCHARGE MEDICATIONS: Please see discharge instructions for a list of discharge medications.    ____________________________ T. Rachelle Hora, PA-C tcg:at D: 11/09/2013 08:28:50 ET T: 11/09/2013 13:29:59 ET JOB#: [id number]  cc: T. Rachelle Hora, PA-C, <Dictator> Duanne Guess [place] ELECTRONICALLY SIGNED 11/28/2013 9:32

## 2014-06-14 NOTE — Consult Note (Signed)
PATIENT NAME:  Kevin Choi, Kevin Choi MR#:  [id number] DATE OF BIRTH:  [birth year]  DATE OF CONSULTATION:  11/07/2013  REFERRING PHYSICIAN:  Laurice Record. Hooten, M.D.  CONSULTING PHYSICIAN:  Earleen Newport. Volanda Napoleon, MD  REASON FOR CONSULTATION:  Hypertension.    HISTORY OF PRESENT ILLNESS: This very pleasant 74 year old man with a past medical history of hypertension, hyperlipidemia, diabetes, coronary artery disease, status post CABG, carotid artery disease, peripheral vascular disease, and history of transient ischemic attacks presents on September 16 for a total left knee arthroplasty by Dr. Marry Guan. The procedure was performed without complication. Unfortunately, he has had escalating blood pressures. On admission, his blood pressure was 128/72. The patient reports that at home when he checks his blood pressure it is generally in the 120/80 range. Blood pressures began escalating after surgery to the 200/100 range, decreased briefly overnight to the 140s/70s and now has escalated during the day again to the 200/90 range. Medicine service is asked to consult for recommendations for blood pressure control.   PAST MEDICAL HISTORY:  1.  Hypertension.  2.  Hyperlipidemia.  3.  Type 2 diabetes mellitus.  4.  History of transient ischemic attack, no residual neurologic defect.  5.  Atherosclerotic cardiovascular disease, status post coronary artery bypass grafting.  6.  Carotid artery disease, status post right carotid endarterectomy.  7.  Peripheral vascular disease.  8.  Peripheral neuropathy due to diabetes.  9.  History of pneumothorax.   SOCIAL HISTORY: The patient lives at home with his wife. He denies alcohol or tobacco use. He is a retired [employer] secretary. He still does [employer] work. He is generally active.   FAMILY HISTORY: Positive for chronic obstructive pulmonary disease, congestive heart failure, coronary artery disease, and arrhythmias.   ALLERGIES: IV DYE causes GI distress.   HOME MEDICATIONS:  1.  Xarelto  20 mg 1 tablet once a day.  2.  Quinapril 30 mg 1 tablet once a day.  3.  Pramipexole 0.25 mg 1 tablet 3 times a day.  4.  Once-A-Day Men's Health Vitamin 1 tablet once a day.  5.  Metoprolol succinate 50 mg 1.5 tablets once a day.  6.  Metformin 500 mg 1 tablet 2 times a day.  7.  Magnesium oxide 500 mg 1 tablet once a day.  8.  Hydralazine 25 mg 1 tablet 3 times a day.  9.  Gabapentin 100 mg 2 tabs orally 3 times a day.  10. Furosemide 20 mg 1 tablet every other day.  11. Fish oil 1000 mg 1 capsule 2 times a day.  12. Finasteride 1 tablet once a day.  13. Cetirizine 10 mg 1 tablet once a day. 14. Atorvastatin 80 mg 0.5 tablets once a day.  15. Acetaminophen 500 mg 2 tablets every 6 hours as needed for pain.   REVIEW OF SYSTEMS: The patient denies chest pain, change in vision, headache, focal numbness or weakness, shortness of breath, difficulty breathing, or peripheral edema. Denies nausea, vomiting, or diarrhea. Does endorse pain at a 6 to 8 out of 10 level.   PHYSICAL EXAMINATION:  VITAL SIGNS: Temperature 98, pulse 72, respirations 18, blood pressure 206/90, pulse oximetry 94% on room air.  GENERAL: Alert, oriented, in no distress.  CARDIOVASCULAR: Regular rate and rhythm; no murmurs, rubs, or gallops; no peripheral edema; peripheral pulses are 2+.  RESPIRATORY: There are fine crackles at the right base, otherwise lungs are clear to auscultation with good air movement.  ABDOMEN: Soft, nontender, nondistended. No guarding. No rebound.  Bowel sounds are normal.  MUSCULOSKELETAL: Strength in the upper extremities is 5/5.  Strength in the right lower  extremity is 5/5, reluctant to move the left leg due to recent knee surgery.  Dressing is intact, dry, clean.  NEUROLOGIC: Cranial nerves II through XII are grossly intact.  Sensation and strength are intact, nonfocal neurologic examination.  PSYCHIATRIC: The patient is alert, oriented.  Good insight. No signs of uncontrolled depression or  anxiety.    LABORATORY DATA: Sodium 134, potassium 4.8, chloride 103, bicarbonate 22, BUN 11, creatinine 0.95, glucose 155, hemoglobin 12.2, platelets 190,000.   ASSESSMENT AND PLAN: Hypertension, uncontrolled, in the setting of recent left total knee replacement:  Etiology of his currently uncontrolled hypertension may be pain or volume overload related to recent surgery. I have ordered an additional dose of Lasix 20 mg IV x 1 to help with diuresis, though it looks like he is negative 2 liters since surgery at this time.  His renal function is excellent and he should tolerate additional diuresis well.  He is currently receiving morphine 15 mg every 4 hours p.r.n. for pain control as well as a Roxicodone tablet.  I agree with judicious use of pain medications and would not increase these at this time.  I have provided p.r.n. hydralazine for systolic blood pressures over 180. If blood pressure does not improve with a combination of diuresis and p.r.n. hydralazine, would increase his standing home dose of hydralazine to 50 mg t.i.d. prior to discharge.  The patient is a [facility] patient and will be seen by his primary care physician tomorrow. Continue with metoprolol, quinapril, and hydralazine at home doses at this time.   TIME SPENT ON THIS CONSULTATION:  20 minutes.    ____________________________ Earleen Newport. Volanda Napoleon, MD cpw:nr D: 11/07/2013 18:33:13 ET T: 11/07/2013 19:27:42 ET JOB#: [id number]  cc: Barnetta Chapel P. Volanda Napoleon, MD, <Dictator> Laurice Record. Holley Bouche., MD Aldean Jewett MD ELECTRONICALLY SIGNED 11/08/2013 23:05

## 2014-06-14 NOTE — Op Note (Signed)
PATIENT NAME:  Kevin Choi, Kevin Choi MR#:  [id number] DATE OF BIRTH:  May 02, 1940  DATE OF PROCEDURE:  11/06/2013  PREOPERATIVE DIAGNOSIS:  Degenerative arthrosis of the left knee.   POSTOPERATIVE DIAGNOSIS:  Degenerative arthrosis of the left knee.   PROCEDURE PERFORMED:  Left total knee arthroplasty using computer-assisted navigation.   SURGEON:  Laurice Record. Hooten, MD   ASSISTANT:  Rachelle Hora, PA-C (required to maintain retraction throughout the procedure).   ANESTHESIA:  Spinal.   ESTIMATED BLOOD LOSS:  50 mL.   FLUIDS REPLACED:  1100 mL of crystalloid.   TOURNIQUET TIME:  97 minutes.   DRAINS:  Two medium drains to reinfusion system.   SOFT TISSUE RELEASES:  Anterior cruciate ligament, posterior cruciate ligament, deep and superficial medial collateral ligament, and patellofemoral ligament.   IMPLANTS UTILIZED:  DePuy PFC Sigma size 4 posterior stabilized femoral component (cemented), size 4 MBT tibial component (cemented), 38 mm 3-peg oval dome patella (cemented), and a 10 mm stabilized rotating platform polyethylene insert.   INDICATIONS FOR SURGERY:  The patient is a 74 year old gentleman who has been seen for complaints of persistent progressive left knee pain. X-rays demonstrated severe degenerative changes in tricompartmental fashion with relative varus alignment. After discussion of the risks and benefits of surgical intervention, the patient expressed understanding of the risks, benefits, and agreed with plans for surgical intervention.   PROCEDURE IN DETAIL:  The patient was brought to the operating room, and after adequate spinal anesthesia was achieved, a tourniquet was placed on the patient's upper left thigh. The patient's left knee and leg were cleaned and prepped with alcohol and DuraPrep draped in the usual sterile fashion. A "timeout" was performed as per usual protocol. The left lower extremity was exsanguinated using an Esmarch, and the tourniquet was inflated to 300 mmHg.  An anterior longitudinal incision was made followed by a standard mid vastus approach. A moderate effusion was evacuated. The deep fibers of the medial collateral ligament were elevated in a subperiosteal fashion off the medial flare of the tibia so as to maintain a continuous soft tissue sleeve. The patella was subluxed laterally, and the patellofemoral ligament was incised.   Inspection of the knee demonstrated severe degenerative changes in tricompartmental fashion with full-thickness loss of articular cartilage to the medial compartment. Osteophytes were debrided using a rongeur. Anterior and posterior cruciate ligaments were excised. Two 4.0 mm Schanz pins were inserted into the femur and into the tibia for attachment of the ray of trackers used for computer-assisted navigation. Hip center was identified using circumduction technique. Distal landmarks were mapped using the computer. The distal femur and proximal tibia were mapped using the computer. Distal femoral cutting guide was positioned using computer-assisted navigation so as to achieve a 5-degree distal valgus cut. Cut was performed and verified using the computer. The distal femur was sized, and it was felt that a size 4 femoral component was appropriate. A size 4 cutting guide was positioned, and anterior cut was performed and verified using the computer. This was followed by completion of the posterior and chamfer cuts. Femoral cutting guide for central box was then positioned, and central box cut was performed.   Attention was then directed to the proximal tibia. Medial and lateral menisci were excised. The extramedullary tibial cutting guide was positioned using computer-assisted navigation so as to achieve a 0-degree varus/valgus alignment and 0-degree posterior slope. Cut was performed and verified using the computer. The proximal tibia was sized, and it was felt that a size 4  tibial tray was appropriate. Tibial and femoral trials were  inserted followed by insertion of a 10 mm polyethylene insert. The knee was felt to be tight medially. A Cobb elevator was used to elevate the superficial fibers of the medial collateral ligament off of the medial flare. This allowed for excellent mediolateral soft tissue balancing both in flexion and in extension.   Finally, the patella was cut and prepared so as to accommodate a 38 mm 3-peg oval dome patella. Patellar trial was placed, and the knee was placed through a range of motion. Excellent patellar tracking was appreciated. The femoral trial was removed. Central post hole for the tibial component was reamed followed by insertion of a keel punch. Tibial trials were then removed. The cut surfaces of bone were irrigated with copious amounts of normal saline with antibiotic solution using pulsatile lavage and then suctioned dry. Polymethyl methacrylate cement with gentamicin was prepared in the usual fashion using a vacuum mixer. Cement was applied to the cut surface of the proximal tibia as well as along the undersurface of a size 4 MBT tibial component. The tibial component was positioned and impacted into place. Excess cement was removed using freer elevators. Cement was then applied to the cut surface of the femur as well as on the posterior flanges of a size 4 posterior stabilized femoral component. Femoral component was positioned and impacted into place. Excess cement was removed using freer elevators. A 10 mm polyethylene trial was inserted, and the knee was brought in full extension with steady axial compression applied. Finally, cement was applied on the backside of a 38 mm 3-peg oval dome patella, and patellar component was positioned and patellar clamp applied. Excess cement was removed using freer elevators. After adequate curing of cement, the tourniquet was deflated after total tourniquet time of 97 minutes. Hemostasis was achieved using electrocautery.   The knee was irrigated with copious  amounts of normal saline with antibiotic solution using pulsatile lavage and then suctioned dry. The knee was inspected for any residual cement debris. Exparel 1.3% 20 mL and 40 mL of normal saline was then injected along the posterior capsule, medial and lateral gutters, and along the arthrotomy site. A 10 mm stabilized rotating platform polyethylene insert was inserted, and the knee was placed through a range of motion with excellent mediolateral soft tissue balancing and excellent patellar tracking noted. Two medium drains were placed in the wound bed and brought out through a separate stab incision to be attached to a reinfusion system. The medial parapatellar portion of the incision was reapproximated using interrupted sutures of #1 Vicryl. Subcutaneous tissue was approximated in layers using first #0 Vicryl followed by #2-0 Vicryl after the injection of 30 mL of 0.25% Marcaine with epinephrine in the subcutaneous tissue. Skin was closed with skin staples. A sterile dressing was applied.   The patient tolerated the procedure well. He was transported to the recovery room in stable condition.    ____________________________ Laurice Record. Holley Bouche., MD jph:nb D: 11/06/2013 22:27:40 ET T: 11/06/2013 23:41:13 ET JOB#: [id number]  cc: Laurice Record. Holley Bouche., MD, <Dictator> JAMES P Holley Bouche MD ELECTRONICALLY SIGNED 11/08/2013 10:07

## 2014-06-15 NOTE — H&P (Signed)
PATIENT NAME:  Kevin Choi, SCHOLER MR#:  [id number] DATE OF BIRTH:  1940/12/14  DATE OF ADMISSION:  05/14/2011  REFERRING PHYSICIAN: Dr. Renard Hamper  FAMILY PHYSICIAN: Dr. Reginia Forts   REASON FOR ADMISSION: Malignant hypertension with hypertensive encephalopathy and presumed transient ischemic attack.   HISTORY OF PRESENT ILLNESS: The patient is a 74 year old male with a history of peripheral vascular disease, benign hypertension, diabetes, coronary artery disease status post CABG, and previous transient ischemic attack who presents to the Emergency Room with transient slurred speech associated with lethargy and severe headache. In the Emergency Room, the patient's initial head CT was negative. His blood pressure was unable to be controlled despite IV medication in the Emergency Room. His headache did not subside as well. He has become lethargic and is now admitted for further evaluation.   PAST MEDICAL HISTORY:  1. Benign hypertension.  2. Hyperlipidemia.  3. Type 2 diabetes mellitus.  4. History of transient ischemic attacks.  5. Atherosclerotic cardiovascular disease status post coronary artery bypass graft.  6. Peripheral vascular disease.  7. Peripheral neuropathy.  8. History of pneumothorax.  9. Status post right carotid endarterectomy.   MEDICATIONS:  1. Mirapex 0.25 mg p.o. t.i.d.  2. Plavix 75 mg p.o. daily.  3. Toprol-XL 50 mg p.o. daily.  4. Lipitor 40 mg p.o. daily. Metformin 500 mg p.o. b.i.d.  5. Neurontin 200 mg p.o. t.i.d.  6. Lasix 20 mg p.o. daily as needed for swelling.  7. Fish oil 1 gram p.o. twice a day.  8. Proscar 5 mg p.o. daily.  9. Accupril 10 mg p.o. daily.  10. Aspirin 325 mg p.o. daily.   ALLERGIES: Intravenous pyelogram dye.   SOCIAL HISTORY: Negative for alcohol or tobacco abuse.   FAMILY HISTORY: Positive for chronic obstructive pulmonary disease, right-sided congestive heart failure, coronary artery disease, and cardiac arrhythmias.   REVIEW OF SYSTEMS:  Unable to obtain from the patient due to his lethargy and somnolence.   PHYSICAL EXAMINATION:  GENERAL: The patient is lethargic/somnolent, but in no acute distress.   VITAL SIGNS: Vital signs are currently remarkable for a blood pressure of 184/104 with a heart rate of 97 and a respiratory rate of 16. He is afebrile.   HEENT: Pupils equally round and reactive to light and accommodation. Extraocular movements are intact. Sclerae anicteric. Conjunctivae are clear.  Oropharynx is clear.  NECK: Supple without jugular venous distention. Soft bilateral carotid bruits are noted. No adenopathy or thyromegaly is present.   LUNGS: Clear to auscultation and percussion without wheezes, rales, or rhonchi. No dullness.   CARDIAC: Regular rate and rhythm with normal S1, S2. No significant rubs, murmurs, or gallops. PMI is nondisplaced. Chest wall is nontender.   ABDOMEN: Soft, nontender, with normoactive bowel sounds. No organomegaly or masses were appreciated. No hernias or bruits were noted.   EXTREMITIES: Without clubbing, cyanosis, or edema. Pulses were 2+ bilaterally.   SKIN: Warm and dry without rash or lesions.   NEUROLOGIC: Cranial nerves II through XII grossly intact. Deep tendon reflexes were symmetric. Motor and sensory exam is nonfocal.   PSYCH: The patient is lethargic/somnolent and unable to answer questions.   LABORATORY, DIAGNOSTIC, AND RADIOLOGICAL DATA: CBC revealed a white count of 9.9 with a hemoglobin of 14.5. Sedimentation rate was normal at 7. Glucose was 110 with a BUN of 22 and a creatinine of 0.82 with a GFR of greater than 60. Sodium was 137 with a potassium of 4.5. CT of the head revealed no acute ischemic  or hemorrhagic infarction.   ASSESSMENT:  1. Presumed recurrent transient ischemic attack.  2. Malignant hypertension.  3. Hypertensive encephalopathy.  4. Type 2 diabetes mellitus.  5. Mild dehydration.  6. Peripheral vascular disease with previous right carotid  endarterectomy.  7. Atherosclerotic cardiovascular disease status post previous coronary artery bypass graft.   PLAN:  The patient will be admitted to telemetry as a FULL CODE.  We will follow vital signs and neuro checks q. 4 hours. We will maximize his blood pressure regimen at this time. We will obtain an MRI of the brain as well as carotid Doppler's and an echocardiogram. We will follow his sugars with Accu-Cheks before meals and at bedtime and add sliding scale insulin as needed. Follow up routine labs in the morning. Will obtain a chest x-ray at this time.  Further treatment and evaluation will depend upon the patient's progress.   TOTAL TIME SPENT ON THIS PATIENT: 50 minutes.    ____________________________ Leonie Douglas Doy Hutching, MD jds:bjt D: 05/14/2011 02:05:39 ET T: 05/14/2011 10:11:19 ET JOB#: [id number]  cc: Leonie Douglas. Doy Hutching, MD, <Dictator> Renette Butters. Tamala Julian, MD JEFFREY Lennice Sites MD ELECTRONICALLY SIGNED 05/15/2011 0:22

## 2014-06-15 NOTE — Discharge Summary (Signed)
PATIENT NAME:  Kevin Choi, Kevin Choi MR#:  [id number] DATE OF BIRTH:  03-Dec-1940  DATE OF ADMISSION:  05/14/2011 DATE OF DISCHARGE:  05/15/2011  PRIMARY CARE PHYSICIAN: Dr. Reginia Forts   REASON FOR ADMISSION: Malignant hypertension with hypertensive encephalopathy and presumed transient ischemic attack.   DISCHARGE DIAGNOSES:  1. Transient ischemic attack.  2. Malignant hypertension with hypertensive encephalopathy. 3. Fever of unknown origin. 4. Possible meningioma.  5. History of hypertension.  6. History of hyperlipidemia.  7. History of diabetes mellitus. 8. History of carotid artery stenosis status post carotid endarterectomy.  9. History of coronary artery disease status post coronary artery bypass graft  10. History peripheral vascular disease  11. History of peripheral neuropathy. 12. History of pneumothorax.  13. History of transient ischemic attacks in the past.   CONSULTS: None.   DISCHARGE DISPOSITION: Home.   DISCHARGE MEDICATIONS:  1. Aspirin 325 mg daily. 2. Plavix 75 mg daily. 3. Toprol-XL 50 mg p.o. every 12 hours.  4. Clonidine 0.2 mg p.o. t.i.d.  5. Hydralazine 25 mg p.o. t.i.d.  6. Quinapril 20 mg p.o. daily.  7. Lipitor 40 mg daily.  8. Mirapex 25 mcg t.i.d.  9. Glucophage 500 mg b.i.d.  10. Gabapentin 200 mg p.o. t.i.d.  11. Fish oil 1000 mg b.i.d.  12. Finasteride 5 mg daily. 13. Geritol oral tablet 1 tablet daily.   DISCHARGE CONDITION: Improved, stable.   DISCHARGE ACTIVITY: As tolerated.   DISCHARGE DIET: Low sodium, ADA, low fat, low cholesterol.   DISCHARGE INSTRUCTIONS:  1. Take medication as prescribed.  2. Return to Emergency Department for recurrence of symptoms or for development of confusion, headache, changes in vision, slurred speech, drooping of the face, numbness, weakness or tingling, chest pain or shortness of breath.   FOLLOW UP INSTRUCTIONS:  1. Follow up with Dr. Reginia Forts within 1 to 2 weeks. Patient needs repeat blood  pressure check (as well as recheck of his pulse) within one week. 2. Follow up to your neurologist in [place], [place] within 1 to 2 weeks.   LABORATORY, DIAGNOSTIC AND RADIOLOGICAL DATA: Noncontrast head CT 05/13/2011: No evidence of acute ischemic or hemorrhagic infarction.   Chest x-ray PA and lateral 05/14/2011: Study is somewhat limited due to hypoinflation, likely low-grade interstitial edema, correlate clinically. No focal infiltrates or evidence of pneumonia.   Bilateral carotid artery Doppler ultrasound 05/14/2011: No evidence of hemodynamically significant carotid artery stenosis. There is antegrade flow noted in both vertebral arteries.   Brain MRI with and without contrast on 05/14/2011: No evidence of acute ischemic or hemorrhagic infarction, stable-appearing area of enhancement along the anterior/superior aspect of the right temporal lobe most compatible with meningioma. On the previous noncontrast study this area resembles appearance on this exam's T1 pre-gadolinium images as well as T2 weighted images. No evidence of hydrocephalus nor intracranial mass effect.   2-D echocardiogram 05/14/2011: LV is grossly normal. No thrombus. LV systolic function normal. Ejection fraction greater than 55%. Normal LV wall thickness and LV wall motion. RV systolic function normal.   Lipid panel: Total cholesterol 128, triglycerides 167, HDL 29, LDL 66.   Hemoglobin A1c 7.2.   CBC normal on admission.  Cardiac enzymes negative x3 sets.   CMP normal on admission except for BUN slightly elevated at 22 with normal creatinine.    BRIEF HISTORY/HOSPITAL COURSE: Patient is a 74 year old male past medical history of hypertension, hyperlipidemia, diabetes mellitus, previous transient ischemic attacks, carotid artery stenosis status post carotid endarterectomy, coronary artery disease status  post coronary artery bypass graft who presented to the Emergency Department with slurred speech and  confusion and headache noted to be profoundly hypertensive upon arrival. Please see dictated admission history and physical for pertinent details surrounding the onset of this hospitalization and please see below for further details. 1. Suspected transient ischemic attack and malignant hypertension with hypertensive encephalopathy-Initial noncontrast head CT head negative for acute intracranial abnormalities. Thereafter patient was admitted to the medical floor for further evaluation and management as well as blood pressure control with gradual blood pressure reduction. He was maintained on aspirin and Plavix as he was taking at home. Blood pressure was gradually reduced and after adjustments were made to his antihypertensive regimen patient's symptoms had resolved with return of mental status to baseline and resolution of slurred speech and confusion as well as his headache. Work-up was essentially negative. MRI was negative for acute abnormalities and revealed probable stable right temporal meningioma for which patient was advised to follow up with his neurologist upon discharge and may require further neurosurgeon as an outpatient and this will be deferred to his neurologist in [place], [place]. Otherwise work-up was unremarkable with negative carotid Doppler's, echocardiogram and telemetry monitoring was benign. He will need aggressive risk factor modification especially blood pressure control. He was felt to have a transient ischemic attack for which he will continue aspirin and Plavix and statin therapy and LDL is at goal and he will need aggressive risk factor modification as above, especially blood pressure control.  2. Malignant hypertension with hypertensive encephalopathy-Blood pressure is now much improved and at goal at the time of discharge with gradual blood pressure reduction. We increased the frequency of his metoprolol and increased his ACE inhibitor dose and we started clonidine and  hydralazine as well. He will need close outpatient follow up for further management of his hypertension.  3. Fever of unknown origin-Which had resolved. Patient had a low-grade temperature 100.5 overnight and the day prior to discharge but on the day of discharge he was afebrile. There is no obvious source of infection. His WBC count is within normal limits. There was no pneumonia detected on chest x-ray. Urinalysis was not indicative of urinary tract infection. Patient was nontoxic appearing. His mental status had improved to baseline with blood pressure control. He denied any headache or neck stiffness. He was advised to return to the ER if fevers redevelop or if he develops any chills and also instructed his wife of the same and patient and his wife verbalized their understanding of these instructions and agreed to return to the ER if he developed any fevers or chills.  4. Hyperlipidemia-Patient to continue statin therapy. LDL is at goal. 5. Type 2 diabetes mellitus-Patient to continue metformin. Hemoglobin A1c 7.2.  6. History of coronary artery disease status post coronary artery bypass graft-Patient to continue medical management with aspirin, Plavix, beta blocker, ACE inhibitor, and statin therapy. He was without any chest pain or shortness of breath during his hospitalization and cardiac enzymes are negative x3 sets. 7. History of peripheral vascular disease with carotid artery stenosis status post carotid endarterectomy-Carotid Doppler's were benign during this hospitalization. He will continue medical management with aspirin, Plavix, and statin therapy.  8. On 05/15/2011 patient was hemodynamically stable and without any confusion or slurred speech and blood pressure was well controlled and he was felt to be stable for discharge home with close outpatient follow up to which patient was agreeable.   TIME SPENT ON DISCHARGE: Greater than 30 minutes.  ____________________________ Romie Jumper,  MD knl:cms D: 05/19/2011 19:40:50 ET T: 05/21/2011 16:19:23 ET JOB#: [id number]  cc: Romie Jumper, MD, <Dictator> Renette Butters. Tamala Julian, MD Romie Jumper MD ELECTRONICALLY SIGNED 06/01/2011 22:01

## 2014-06-22 NOTE — Op Note (Signed)
PATIENT NAME:  Kevin Choi, Kevin Choi MR#:  [id number] DATE OF BIRTH:  Aug 07, 1940  DATE OF PROCEDURE:  04/02/2014  PREOPERATIVE DIAGNOSIS: Degenerative arthrosis of the right knee (primary).   POSTOPERATIVE DIAGNOSIS: Degenerative arthrosis of the right knee (primary).   PROCEDURE PERFORMED: Right total knee arthroplasty using computer-assisted navigation.   SURGEON: Laurice Record. Holley Bouche., MD.  ASSISTANTMarland Kitchen Vance Peper, PA (required to maintain retraction throughout the procedure).   ANESTHESIA: Spinal.   ESTIMATED BLOOD LOSS: 50 mL.   FLUIDS REPLACED: 1700 mL of crystalloid.  TOURNIQUET TIME: 93 minutes.   DRAINS: Two medium drains to reinfusion system.  SOFT TISSUE RELEASES: Anterior cruciate ligament, posterior cruciate ligament, deep and superficial medial collateral ligament, and patellofemoral ligament.  IMPLANTS UTILIZED: DePuy PFC Sigma size 4 posterior stabilized femoral component (cemented), size 4 MBT tibial component (cemented), 38-mm 3-peg oval, dome patella (cemented), and a 10-mm stabilized rotating platform polyethylene insert. Gentamicin bone cement was utilized due to the patient's history of diabetes.  INDICATIONS FOR SURGERY: The patient is a 74 year old male who has been seen for complaints of progressive right knee pain. X-rays demonstrated significant degenerative changes with a relative varus deformity. After discussion of the risks and benefits of surgical intervention, the patient expressed understanding of the risks and benefits, and agreed with plans for surgical intervention.   PROCEDURE IN DETAIL: The patient was brought into the Operating Room and, after adequate spinal anesthesia was achieved, a tourniquet was placed on the patient's upper right thigh. The patient's right knee and leg were cleaned and prepped with alcohol and DuraPrep and draped in the usual sterile fashion. A "timeout" was performed as per usual protocol. The right lower extremity was exsanguinated  using an Esmarch, and the tourniquet was inflated to 300 mmHg. An anterior longitudinal incision was made followed by a standard mid vastus approach. A moderate effusion was evacuated. The deep fibers of the medial collateral ligament were elevated in a subperiosteal fashion off the medial flare of the tibia so as to maintain a continuous soft tissue sleeve. The patella was subluxed laterally and the patellofemoral ligament was incised. Inspection of the knee demonstrated significant degenerative changes, most notably to the medial compartment. Prominent osteophytes were debrided using rongeurs. Anterior and posterior cruciate ligaments were excised. Two 4.0-mm Schanz pins were inserted into the femur and into the tibia for attachment of the array of trackers used for computer-assisted navigation. Hip center was identified using circumduction technique. Distal landmarks were mapped using the computer. The distal femur and proximal tibia were mapped using the computer. Distal femoral cutting guide was positioned using computer-assisted navigation so as to achieve a 5-degrees distal valgus cut. Cut was performed and verified using the computer. Distal femur was sized and it was felt that a size 4 femoral component was appropriate. A size four cutting guide was positioned and anterior cut was performed and verified using the computer. This was followed by completion of the posterior and chamfer cuts. Femoral cutting guide for the central box was then positioned and the central box cut was performed.  Attention was then directed to the proximal tibia. Medial and lateral menisci were excised. The extramedullary tibial cutting guide was positioned using computer-assisted navigation so as to achieve 0-degree varus/valgus alignment and a 0-degree posterior slope. Cut was performed and verified using the computer. The proximal tibia was sized and it was felt that a size 4 tibial tray was appropriate. A size 4 tibial tray  was applied and was a femoral trial.  A 10-mm polyethylene trial was inserted and the knee was placed through a range of motion. The knee was felt to be tight medially. A Cobb elevator was used to elevate the superficial fibers of the medial collateral ligament in a subperiosteal fashion. This allowed for good equalization of mediolateral soft tissue balance.  Finally, the patella was cut and repaired so as to accommodate a 38-mm, 3-peg, oval dome patella. Patellar trial was placed, and the knee was placed through a range of motion with excellent patellar tracking appreciated. The femoral component was removed after debridement of posterior osteophytes. A central post hole for the tibial component was reamed followed by insertion of a keel punch. Tibial trial was then removed. The cut surfaces of bone were irrigated with copious amounts of normal saline with antibiotic solution and then suctioned dry. Polymethyl methacrylate cement with gentamicin was prepared in the usual fashion using a vacuum mixer. Cement was applied to the cut surface of the proximal tibia as well as along the undersurface of a size 4 MBT tibial component. The tibial component was positioned and impacted into place. Excess cement was removed using [employer] fast streamer. Cement was then applied to the cut surface of the femur as well as along the posterior flanges of a size 4 posterior stabilized femoral component. Femoral component was positioned and impacted into place. Excess cement was removed using [employer] fast streamer. A 10-mm polyethylene trial was inserted, and the knee was brought in full extension with steady axial compression applied. Finally, cement was applied to the backside of a 38-mm, 3-peg, oval dome patella and the patella component was positioned and patellar clamp applied. Excess cement was removed using [employer] fast streamer.   After adequate curing of cement, the tourniquet was deflated after a total tourniquet time of 93 minutes.  Hemostasis was achieved using electrocautery. The knee was irrigated with copious amounts of normal saline with antibiotic solution using pulsatile lavage and then suctioned dry. The knee was inspected for any residual cement debris. Then, 20 mL of 1.3% Exparel and 40 mL of normal saline was injected along the posterior capsule, medial and lateral gutters, and along the arthrotomy site. A 10-mm stabilized rotating platform polyethylene insert was inserted and the knee was placed through a range of motion with excellent patellar tracking appreciated and excellent mediolateral soft tissue balancing noted. Two medium drains were placed in the wound bed and brought out through a separate stab incision to be attached to a reinfusion system. The medial parapatellar portion of the incision was reapproximated using interrupted sutures of #1 Vicryl. Then, 30 mL of 0.25% Marcaine with epinephrine was injected into the subcutaneous tissue along the incision site. The subcutaneous tissue was approximated in layers using first #0 Vicryl followed by 2-0 Vicryl. Skin was closed with skin staples. A sterile dressing was applied.   The patient tolerated the procedure well. He was transported to the recovery room in stable condition.   ____________________________ Laurice Record. Holley Bouche., MD [email or web address]:jh D: 04/03/2014 06:24:20 ET T: 04/03/2014 11:15:49 ET JOB#: [id number]  cc: Laurice Record. Holley Bouche., MD, <Dictator> JAMES P Holley Bouche MD ELECTRONICALLY SIGNED 04/07/2014 20:20

## 2014-06-22 NOTE — Discharge Summary (Signed)
PATIENT NAME:  Kevin Choi, Kevin Choi MR#:  [id number] DATE OF BIRTH:  30-Mar-1940  DATE OF ADMISSION:  04/02/2014 DATE OF DISCHARGE:  04/05/2014  DICTATED FOR: Laurice Record. Holley Bouche., MD   ADMITTING DIAGNOSIS: Degenerative arthrosis of the right knee.   DISCHARGE DIAGNOSIS: Degenerative arthrosis of the right knee.   HISTORY OF PRESENT ILLNESS: The patient is a 74 year old gentleman who has been followed at [facility] for progression of right knee pain. The patient had previously undergone a left total knee arthroplasty and had done well. However, the right knee continued to give him a lot of discomfort. He had localized most of the pain along the medial aspect of the knee. His pain was aggravated with activities such as weight-bearing. On occasion, the patient was noted to have some swelling, as well as giving way of the knee. X-rays taken in [place] showed bone-on-bone to the medial compartment space. It had normal alignment. The patella was tracking slightly laterally with a slight tilt. After discussion of the risks, benefits of surgical intervention, the patient expressed his understanding of the risks and benefits and agreed for plans for surgical intervention.   HOSPITAL COURSE:  PROCEDURE: Right total knee arthroplasty using computer-assisted navigation.   ANESTHESIA: Spinal.   SOFT TISSUE RELEASE: Anterior cruciate ligament, posterior cruciate ligament, deep and superficial medial collateral ligaments, as well as patellofemoral ligament.   IMPLANTS UTILIZED: DePuy PFC Sigma size 4 posterior stabilized femoral component (cemented), size 4 MBT tibial component (cemented), 38 mm 3- pegged oval dome patella (cemented), and a 10 mm stabilized rotating platform polyethylene insert. Gentamicin bone cement was utilized due to the patient's history of diabetes.   The patient tolerated the procedure very well. He had no complications. He was then taken to the PAC-U, where he was  stabilized and then transferred to the orthopedic floor. The patient was placed back on Xarelto for anticoagulation therapy. He was on his prior to admission. The patient was also fitted with TED stockings bilaterally. These are allowed to be removed 1 hour per 8-hour shift. The right one was applied on day 2, following removal of the Hemovac and dressing change. The patient was fitted with an AVI compression foot pumps set at 80 mmHg. His calves have been nontender. There has been no evidence of any DVTs. Negative Homans sign. Heels were elevated off the bed using rolled towels.   The patient has denied any chest pain or shortness of breath. Vital signs have been stable. He has been afebrile. Hemodynamically, he was stable and no transfusions were given, other than the Autovac transfusions given the first 6 hours postoperatively.   Physical therapy was initiated on day 1 for gait training and transfers. He has done very well. He had no complications. Upon being discharged, he was ambulating greater than 200 feet. He was able to go up 4 steps. He was independent with bed-to-chair transfers. Occupational therapy was also initiated on day 1 for ADLs and assistive devices.   The patient's IV, Foley, and Hemovac were discontinued on day 2 along with the dressing change. The Polar Care was reapplied to the surgical leg, maintaining a temperature of 40-50 degrees Fahrenheit.   DISPOSITION: The patient is being discharged to home in improved stable condition.   DISCHARGE INSTRUCTIONS: He will continue weight-bearing as tolerated. Continue using a rolling walker until cleared by physical therapy to go to a quad cane. He will receive home health PT. Elevate the lower extremities when sitting. Elevate the  heels off the bed using rolled towels. Continue TED stockings. These are to be worn during the day, but may be removed at night. Incentive spirometer q. 1 hour while awake. Encouraged cough and deep breathing q. 2  hours while awake. He was placed on a diabetic diet. He is to continue with Polar Care, maintaining a temperature of 40-50 degrees Fahrenheit. He has a follow-up appointment with the February 25 at 9:15. He is to call the clinic sooner for any temperatures over 101.5, or with any other complications.   DISCHARGE MEDICATIONS: The patient may resume his regular medications that he was on prior to admission. He was given a prescription for Lovenox 40 mg subcutaneously daily for 14 days, then discontinue and begin taking one 81 mg enteric-coated aspirin. Also a prescription for  Roxicodone 5-10 mg every 4-6 hours p.r.n. for pain, and Travenol 50-100 mg q. 4-6 hours p.r.n. for pain.   PAST MEDICAL HISTORY: Diabetes, hypertension, shingles, coronary artery disease, migraines, benign prostatic hypertrophy, obesity, hyperlipidemia.    ____________________________ Vance Peper, PA [id number] D: 04/05/2014 08:11:23 ET T: 04/05/2014 11:56:34 ET JOB#: [id number]  cc: Vance Peper, PA, <Dictator> Ayah Cozzolino PA ELECTRONICALLY SIGNED 04/05/2014 21:07

## 2014-08-26 ENCOUNTER — Other Ambulatory Visit: Payer: Self-pay | Admitting: Physician Assistant

## 2014-08-26 ENCOUNTER — Other Ambulatory Visit: Payer: Self-pay | Admitting: Orthopedic Surgery

## 2014-08-26 ENCOUNTER — Ambulatory Visit
Admission: RE | Admit: 2014-08-26 | Discharge: 2014-08-26 | Disposition: A | Discharge status: Home / Self Care | Payer: PPO | Source: Ambulatory Visit | Attending: Physician Assistant | Admitting: Physician Assistant

## 2014-08-26 DIAGNOSIS — M7989 Other specified soft tissue disorders: Secondary | ICD-10-CM

## 2014-08-26 DIAGNOSIS — Z96651 Presence of right artificial knee joint: Secondary | ICD-10-CM | POA: Diagnosis not present

## 2014-11-17 ENCOUNTER — Ambulatory Visit (INDEPENDENT_AMBULATORY_CARE_PROVIDER_SITE_OTHER): Payer: PPO | Admitting: Cardiovascular Disease

## 2014-11-17 ENCOUNTER — Encounter: Payer: Self-pay | Admitting: Cardiovascular Disease

## 2014-11-17 VITALS — BP 130/62 | HR 61 | Ht 68.5 in | Wt 216.0 lb

## 2014-11-17 DIAGNOSIS — I10 Essential (primary) hypertension: Secondary | ICD-10-CM

## 2014-11-17 DIAGNOSIS — E785 Hyperlipidemia, unspecified: Secondary | ICD-10-CM

## 2014-11-17 DIAGNOSIS — I25119 Atherosclerotic heart disease of native coronary artery with unspecified angina pectoris: Secondary | ICD-10-CM | POA: Diagnosis not present

## 2014-11-17 DIAGNOSIS — I6523 Occlusion and stenosis of bilateral carotid arteries: Secondary | ICD-10-CM | POA: Diagnosis not present

## 2014-11-17 DIAGNOSIS — E1142 Type 2 diabetes mellitus with diabetic polyneuropathy: Secondary | ICD-10-CM

## 2014-11-17 DIAGNOSIS — G629 Polyneuropathy, unspecified: Secondary | ICD-10-CM

## 2014-11-17 NOTE — Progress Notes (Signed)
Patient ID: Kevin Choi, male    DOB: 08-Nov-1940, 74 y.o.   MRN: [id number]  HPI Comments: 74 year old gentleman with a history of coronary artery disease, bypass surgery with a LIMA to the LAD in 2002, vein graft to the diagonal, diabetes, hypertension, hyperlipidemia, history of pleural effusion requiring a VATS, carotid endarterectomy on the right in 2001, who presents for routine followup of his coronary artery disease . He has had TIA symptoms 3 times, once in 2002, October 2012 in March 2013. Status post bilateral total knee replacements  In follow-up today, he reports he is doing well He has neuropathy for which he takes gabapentin 300 mg 3 times a day He takes Lasix days per week Active, no regular exercise program Has not been monitoring his blood pressure at home Denies any chest pain, shortness of breath, leg edema He has been closely monitoring his blood pressure over the course of the year with systolic pressure typically 130 up to 140. Ranges 120 up to 150  EKG on today's visit shows normal sinus rhythm with rate 61 bpm, nonspecific ST and T wave abnormality  Other past medical history  January 2016 shows cholesterol at goal, hemoglobin A1c 6.8   May 14 2011  he had acute onset of slurred speech. He was playing cards shortly after and he was unable to do any math. Symptoms lasted for approximately one to 2 hours. Blood pressure prior to this event had been well controlled though during and after the event, he had severe hypertension with systolic pressures sometimes greater than 200. He was in the emergency room for 2 days with extensive testing.  carotid ultrasound that showed no hemodynamically significant stenosis Head CT scan showing no acute stroke MRI of the head showing no infarction. Stable appearing area of enhancement of the right temporal lobe most compatible with meningioma Chest x-ray was essentially normal  Stress test April 2009 shows no significant  ischemia. This was a pharmacologic study.  Allergies  Allergen Reactions  . Other Nausea Only    Contrast Dye-drops blood pressure.    Outpatient Encounter Prescriptions as of 11/17/2014  Medication Sig  . atorvastatin (LIPITOR) 80 MG tablet Take 1 tablet (80 mg total) by mouth daily.  . celecoxib (CELEBREX) 200 MG capsule Take 1 capsule (200 mg total) by mouth 2 (two) times daily.  . cetirizine (ZYRTEC) 10 MG tablet Take 10 mg by mouth as needed.   . diphenoxylate-atropine (LOMOTIL) 2.5-0.025 MG per tablet Take 1 tablet by mouth 4 (four) times daily as needed for diarrhea or loose stools.  . finasteride (PROSCAR) 5 MG tablet Take 1 tablet (5 mg total) by mouth daily.  . furosemide (LASIX) 20 MG tablet Take 1 tablet (20 mg total) by mouth 2 (two) times daily as needed.  . gabapentin (NEURONTIN) 100 MG capsule Take 2 capsules (200 mg total) by mouth 4 (four) times daily.  [name] glucose blood test strip Accuchek compact plus. Dx E11.9 -Check sugars twice a day  . hydrALAZINE (APRESOLINE) 100 MG tablet Take 1 tablet (100 mg total) by mouth 3 (three) times daily.  . magnesium gluconate (MAGONATE) 500 MG tablet Take 500 mg by mouth daily.  . metFORMIN (GLUCOPHAGE) 500 MG tablet Take 2 tablets (1,000 mg total) by mouth 2 (two) times daily with a meal.  . metoprolol succinate (TOPROL-XL) 50 MG 24 hr tablet Take one and half tablet daily.  . Multiple Vitamins-Minerals (MULTIVITAL) tablet Take 1 tablet by mouth daily.    [name]  nitroGLYCERIN (NITROSTAT) 0.4 MG SL tablet Place 0.4 mg under the tongue every 5 (five) minutes as needed.    . Omega-3 Fatty Acids (FISH OIL) 1000 MG CAPS Take by mouth 2 (two) times daily.    . pramipexole (MIRAPEX) 0.25 MG tablet Take 1 tablet (0.25 mg total) by mouth 3 (three) times daily.  . promethazine (PHENERGAN) 12.5 MG tablet Take 1 tablet (12.5 mg total) by mouth every 8 (eight) hours as needed for nausea.  . quinapril (ACCUPRIL) 10 MG tablet Take 1 tablet (10 mg total) by  mouth daily.  . quinapril (ACCUPRIL) 20 MG tablet Take 1 tablet (20 mg total) by mouth daily.  . Rivaroxaban (XARELTO) 20 MG TABS tablet Take 1 tablet (20 mg total) by mouth daily.   No facility-administered encounter medications on file as of 11/17/2014.    Past Medical History  Diagnosis Date  . CAD (coronary artery disease) 2002    bypass  . Diabetes mellitus   . HTN (hypertension) 05/08/2007  . HLD (hyperlipidemia)   . Pleural effusion 2007    with pneumothorax   . TIA (transient ischemic attack)     Patient stated 2 months ago  . Peripheral vascular disease   . Headache(784.0)   . Slurred speech   . Nausea with vomiting   . Hyperhidrosis   . Leg pain   . Personal history of urinary calculi   . Hemiplegic migraine, without mention of intractable migraine without mention of status migrainosus   . Elevated prostate specific antigen (PSA) 02/21/2001  . Allergic rhinitis due to pollen   . Occlusion and stenosis of carotid artery without mention of cerebral infarction 09/15/2006  . Restless legs syndrome (RLS) 01/10/2007  . Impotence of organic origin 07/24/2007  . Chicken pox   . Measles   . Mumps   . Peripheral neuropathy 01/31/2008  . Diabetes   . Neuropathy     Past Surgical History  Procedure Laterality Date  . Coronary artery bypass graft  2001  . Carotid endarterectomy  2001    right  . Collapse lung  2007  . Parotid gland tumor excision  1973  . Lung surgery  2007    Left; decortication left lung for persistant pleural effusion and PTX. admission ten days at [facility].  . Vasectomy    . Cholecystectomy  2003  . Knee surgery Left 1981    Social History  reports that he has quit smoking. His smoking use included Cigarettes. He has a 20 pack-year smoking history. He has never used smokeless tobacco. He reports that he does not drink alcohol or use illicit drugs.  Family History family history includes Angina in his father; Anxiety disorder in his brother; Cancer  in his father and other; Colon polyps in his sister; Coronary artery disease in his other; Emphysema in his father; Heart attack in his brother and mother; Heart disease in his maternal aunt; Heart failure in his father; Lymphoma in his sister.  Review of Systems  Constitutional: Negative.   Respiratory: Negative.   Cardiovascular: Negative.   Gastrointestinal: Negative.   Musculoskeletal: Positive for back pain.  Skin: Negative.   Neurological: Negative.   Hematological: Negative.   Psychiatric/Behavioral: Negative.   All other systems reviewed and are negative.  BP 130/62 mmHg  Pulse 61  Ht 5' 8.5" (1.74 m)  Wt 216 lb (97.977 kg)  BMI 32.36 kg/m2  Physical Exam  Constitutional: He is oriented to person, place, and time. He appears well-developed and well-nourished.  HENT:  Head: Normocephalic.  Nose: Nose normal.  Mouth/Throat: Oropharynx is clear and moist.  Eyes: Conjunctivae are normal. Pupils are equal, round, and reactive to light.  Neck: Normal range of motion. Neck supple. No JVD present.  Cardiovascular: Normal rate, regular rhythm, S1 normal, S2 normal, normal heart sounds and intact distal pulses.  Exam reveals no gallop and no friction rub.   No murmur heard. Pulmonary/Chest: Effort normal and breath sounds normal. No respiratory distress. He has no wheezes. He has no rales. He exhibits no tenderness.  Abdominal: Soft. Bowel sounds are normal. He exhibits no distension. There is no tenderness.  Musculoskeletal: Normal range of motion. He exhibits no edema or tenderness.  Lymphadenopathy:    He has no cervical adenopathy.  Neurological: He is alert and oriented to person, place, and time. Coordination normal.  Skin: Skin is warm and dry. No rash noted. No erythema.  Psychiatric: He has a normal mood and affect. His behavior is normal. Judgment and thought content normal.      Assessment and Plan   Nursing note and vitals reviewed.

## 2014-11-17 NOTE — Assessment & Plan Note (Signed)
Currently with no symptoms of angina. No further workup at this time. Continue current medication regimen. 

## 2014-11-17 NOTE — Assessment & Plan Note (Signed)
Cholesterol is at goal on the current lipid regimen. No changes to the medications were made.  

## 2014-11-17 NOTE — Assessment & Plan Note (Signed)
We have encouraged continued exercise, careful diet management in an effort to lose weight. 

## 2014-11-17 NOTE — Assessment & Plan Note (Signed)
carotid endarterectomy on the right. Ultrasound in 2015 showing stable 40% stenosis on the left Repeat ultrasound in 2017

## 2014-11-17 NOTE — Assessment & Plan Note (Addendum)
He is concerned about low heart rate. Recommended he decrease the metoprolol down to 50 mg daily. If blood pressure runs higher, would increase quinapril up to 40 mg daily

## 2014-11-17 NOTE — Patient Instructions (Addendum)
You are doing well.  Please decrease the metoprolol down to 50 mg daily Monitor your blood pressure  If blood pressure runs high, We could increase the quinapril up to 40 mg daily  Please call us if you have new issues that need to be addressed before your next appt.  Your physician wants you to follow-up in: 6 months.  You will receive a reminder letter in the mail two months in advance. If you don't receive a letter, please call our office to schedule the follow-up appointment.

## 2015-03-09 DIAGNOSIS — I8312 Varicose veins of left lower extremity with inflammation: Secondary | ICD-10-CM | POA: Diagnosis not present

## 2015-03-09 DIAGNOSIS — L578 Other skin changes due to chronic exposure to nonionizing radiation: Secondary | ICD-10-CM | POA: Diagnosis not present

## 2015-03-09 DIAGNOSIS — L820 Inflamed seborrheic keratosis: Secondary | ICD-10-CM | POA: Diagnosis not present

## 2015-03-27 DIAGNOSIS — E7800 Pure hypercholesterolemia, unspecified: Secondary | ICD-10-CM | POA: Diagnosis not present

## 2015-03-27 DIAGNOSIS — I10 Essential (primary) hypertension: Secondary | ICD-10-CM | POA: Diagnosis not present

## 2015-03-27 DIAGNOSIS — E1143 Type 2 diabetes mellitus with diabetic autonomic (poly)neuropathy: Secondary | ICD-10-CM | POA: Diagnosis not present

## 2015-04-03 DIAGNOSIS — I10 Essential (primary) hypertension: Secondary | ICD-10-CM | POA: Diagnosis not present

## 2015-04-03 DIAGNOSIS — E1143 Type 2 diabetes mellitus with diabetic autonomic (poly)neuropathy: Secondary | ICD-10-CM | POA: Diagnosis not present

## 2015-04-03 DIAGNOSIS — Z0000 Encounter for general adult medical examination without abnormal findings: Secondary | ICD-10-CM | POA: Diagnosis not present

## 2015-04-03 DIAGNOSIS — E7800 Pure hypercholesterolemia, unspecified: Secondary | ICD-10-CM | POA: Diagnosis not present

## 2015-04-03 DIAGNOSIS — Z23 Encounter for immunization: Secondary | ICD-10-CM | POA: Diagnosis not present

## 2015-05-18 ENCOUNTER — Ambulatory Visit (INDEPENDENT_AMBULATORY_CARE_PROVIDER_SITE_OTHER): Payer: PPO | Admitting: Cardiovascular Disease

## 2015-05-18 ENCOUNTER — Encounter: Payer: Self-pay | Admitting: Cardiovascular Disease

## 2015-05-18 VITALS — BP 120/72 | HR 56 | Ht 68.5 in | Wt 217.5 lb

## 2015-05-18 DIAGNOSIS — E118 Type 2 diabetes mellitus with unspecified complications: Secondary | ICD-10-CM | POA: Diagnosis not present

## 2015-05-18 DIAGNOSIS — I10 Essential (primary) hypertension: Secondary | ICD-10-CM | POA: Diagnosis not present

## 2015-05-18 DIAGNOSIS — I5022 Chronic systolic (congestive) heart failure: Secondary | ICD-10-CM | POA: Insufficient documentation

## 2015-05-18 DIAGNOSIS — R0602 Shortness of breath: Secondary | ICD-10-CM | POA: Diagnosis not present

## 2015-05-18 DIAGNOSIS — E785 Hyperlipidemia, unspecified: Secondary | ICD-10-CM | POA: Diagnosis not present

## 2015-05-18 DIAGNOSIS — I5033 Acute on chronic diastolic (congestive) heart failure: Secondary | ICD-10-CM | POA: Insufficient documentation

## 2015-05-18 DIAGNOSIS — I5032 Chronic diastolic (congestive) heart failure: Secondary | ICD-10-CM

## 2015-05-18 NOTE — Assessment & Plan Note (Signed)
Blood pressure well controlled on today's visit, heart rate as low Recommended he decrease metoprolol down to 50 mg daily down from 75 mg daily If blood pressure does run high, could reintroduce third dose of hydralazine

## 2015-05-18 NOTE — Assessment & Plan Note (Signed)
Cholesterol is at goal on the current lipid regimen. No changes to the medications were made.  

## 2015-05-18 NOTE — Assessment & Plan Note (Signed)
Hemoglobin A1c 6.9, recommended strict diet, walking program, low carbohydrates

## 2015-05-18 NOTE — Patient Instructions (Signed)
You are doing well.  Please decrease the metoprolol down to one pill a day If blood pressure runs high (>140 on the top consistently),  add back the third hydralazine  For very high blood pressure, Take an extra hydralazine  Please call us if you have new issues that need to be addressed before your next appt.  Your physician wants you to follow-up in: 6 months.  You will receive a reminder letter in the mail two months in advance. If you don't receive a letter, please call our office to schedule the follow-up appointment.

## 2015-05-18 NOTE — Assessment & Plan Note (Signed)
He continues to take Lasix 20 mg 3 days per week He has worsening leg edema, recommended he take 20 mg daily Suggested he moderate his fluid intake He does have a family member pushing him to take more fluids who works in wellness, Long discussion concerning the problems with high fluid intake Symptoms likely exacerbated secondary to his weight   Total encounter time more than 25 minutes  Greater than 50% was spent in counseling and coordination of care with the patient

## 2015-05-18 NOTE — Progress Notes (Signed)
Patient ID: Kevin Choi, male    DOB: [birth year], 75 y.o.   MRN: [id number]  HPI Comments: 75 year old gentleman with a history of coronary artery disease, bypass surgery with a LIMA to the LAD in 2002, vein graft to the diagonal, diabetes, hypertension, hyperlipidemia, history of pleural effusion requiring a VATS, carotid endarterectomy on the right in 2001, who presents for routine followup of his coronary artery disease . He has had TIA symptoms 3 times, once in 2002, October 2012 in March 2013. Status post bilateral total knee replacements  In follow-up today, he reports feeling relatively well Takes Lasix 3 times per week but has noticed some mild leg edema Weight has been slowly trending upwards, no regular exercise program Recently seen by primary care, told to decrease hydralazine down to twice a day for low blood pressure He provides several months worse of blood pressure measurements today, systolic pressure typically 130 up to 150, pulse in the 50s No dramatic fatigue on his current medication regimen  EKG on today's visit shows normal sinus rhythm with rate 56 bpm, no significant ST or T-wave changes  Other past medical history  January 2016 shows cholesterol at goal, hemoglobin A1c 6.8   May 14 2011  he had acute onset of slurred speech. He was playing cards shortly after and he was unable to do any math. Symptoms lasted for approximately one to 2 hours. Blood pressure prior to this event had been well controlled though during and after the event, he had severe hypertension with systolic pressures sometimes greater than 200. He was in the emergency room for 2 days with extensive testing.  carotid ultrasound that showed no hemodynamically significant stenosis Head CT scan showing no acute stroke MRI of the head showing no infarction. Stable appearing area of enhancement of the right temporal lobe most compatible with meningioma Chest x-ray was essentially normal  Stress test  April 2009 shows no significant ischemia. This was a pharmacologic study.  Allergies  Allergen Reactions  . Other Nausea Only    Contrast Dye-drops blood pressure.    Outpatient Encounter Prescriptions as of 05/18/2015  Medication Sig  . atorvastatin (LIPITOR) 80 MG tablet Take 1 tablet (80 mg total) by mouth daily.  . celecoxib (CELEBREX) 200 MG capsule Take 1 capsule (200 mg total) by mouth 2 (two) times daily. (Patient taking differently: Take 200 mg by mouth 2 (two) times daily as needed. )  . cetirizine (ZYRTEC) 10 MG tablet Take 10 mg by mouth as needed.   . diphenoxylate-atropine (LOMOTIL) 2.5-0.025 MG per tablet Take 1 tablet by mouth 4 (four) times daily as needed for diarrhea or loose stools.  . finasteride (PROSCAR) 5 MG tablet Take 1 tablet (5 mg total) by mouth daily.  . furosemide (LASIX) 20 MG tablet Take 1 tablet (20 mg total) by mouth 2 (two) times daily as needed.  . gabapentin (NEURONTIN) 300 MG capsule Take 300 mg by mouth 3 (three) times daily.  [name] glucose blood test strip Accuchek compact plus. Dx E11.9 -Check sugars twice a day  . hydrALAZINE (APRESOLINE) 100 MG tablet Take 1 tablet (100 mg total) by mouth 3 (three) times daily.  . magnesium gluconate (MAGONATE) 500 MG tablet Take 500 mg by mouth daily.  . metFORMIN (GLUCOPHAGE) 500 MG tablet Take 500 mg by mouth 2 (two) times daily with a meal.  . metoprolol succinate (TOPROL-XL) 50 MG 24 hr tablet Take one and half tablet daily.  . Multiple Vitamins-Minerals (MULTIVITAL) tablet Take  1 tablet by mouth daily.    . nitroGLYCERIN (NITROSTAT) 0.4 MG SL tablet Place 0.4 mg under the tongue every 5 (five) minutes as needed.    . Omega-3 Fatty Acids (FISH OIL) 1000 MG CAPS Take by mouth 2 (two) times daily.    . pramipexole (MIRAPEX) 0.25 MG tablet Take 1 tablet (0.25 mg total) by mouth 3 (three) times daily.  . promethazine (PHENERGAN) 12.5 MG tablet Take 1 tablet (12.5 mg total) by mouth every 8 (eight) hours as needed for  nausea.  . quinapril (ACCUPRIL) 10 MG tablet Take 1 tablet (10 mg total) by mouth daily.  . quinapril (ACCUPRIL) 20 MG tablet Take 1 tablet (20 mg total) by mouth daily.  . Rivaroxaban (XARELTO) 20 MG TABS tablet Take 1 tablet (20 mg total) by mouth daily.  . [DISCONTINUED] gabapentin (NEURONTIN) 100 MG capsule Take 2 capsules (200 mg total) by mouth 4 (four) times daily. (Patient not taking: Reported on 05/18/2015)  . [DISCONTINUED] gabapentin (NEURONTIN) 100 MG capsule Take 100 mg by mouth 3 (three) times daily. Reported on 05/18/2015  . [DISCONTINUED] metFORMIN (GLUCOPHAGE) 500 MG tablet Take 2 tablets (1,000 mg total) by mouth 2 (two) times daily with a meal. (Patient not taking: Reported on 05/18/2015)   No facility-administered encounter medications on file as of 05/18/2015.    Past Medical History  Diagnosis Date  . CAD (coronary artery disease) 2002    bypass  . Diabetes mellitus   . HTN (hypertension) 05/08/2007  . HLD (hyperlipidemia)   . Pleural effusion 2007    with pneumothorax   . TIA (transient ischemic attack)     Patient stated 2 months ago  . Peripheral vascular disease ([place])   . Headache(784.0)   . Slurred speech   . Nausea with vomiting   . Hyperhidrosis   . Leg pain   . Personal history of urinary calculi   . Hemiplegic migraine, without mention of intractable migraine without mention of status migrainosus   . Elevated prostate specific antigen (PSA) 02/21/2001  . Allergic rhinitis due to pollen   . Occlusion and stenosis of carotid artery without mention of cerebral infarction 09/15/2006  . Restless legs syndrome (RLS) 01/10/2007  . Impotence of organic origin 07/24/2007  . Chicken pox   . Measles   . Mumps   . Peripheral neuropathy ([place]) 01/31/2008  . Diabetes ([place])   . Neuropathy [facility])     Past Surgical History  Procedure Laterality Date  . Coronary artery bypass graft  2001  . Carotid endarterectomy  2001    right  . Collapse lung  2007  . Parotid  gland tumor excision  1973  . Lung surgery  2007    Left; decortication left lung for persistant pleural effusion and PTX. admission ten days at [facility].  . Vasectomy    . Cholecystectomy  2003  . Knee surgery Left 1981    Social History  reports that he has quit smoking. His smoking use included Cigarettes. He has a 20 pack-year smoking history. He has never used smokeless tobacco. He reports that he does not drink alcohol or use illicit drugs.  Family History family history includes Angina in his father; Anxiety disorder in his brother; Cancer in his father and other; Colon polyps in his sister; Coronary artery disease in his other; Emphysema in his father; Heart attack in his brother and mother; Heart disease in his maternal aunt; Heart failure in his father; Lymphoma in his sister.  Review of  Systems  Constitutional: Negative.   Respiratory: Negative.   Cardiovascular: Negative.   Gastrointestinal: Negative.   Musculoskeletal: Negative.   Neurological: Negative.   Hematological: Negative.   Psychiatric/Behavioral: Negative.   All other systems reviewed and are negative.  BP 120/72 mmHg  Pulse 56  Ht 5' 8.5" (1.74 m)  Wt 217 lb 8 oz (98.657 kg)  BMI 32.59 kg/m2  Physical Exam  Constitutional: He is oriented to person, place, and time. He appears well-developed and well-nourished.  HENT:  Head: Normocephalic.  Nose: Nose normal.  Mouth/Throat: Oropharynx is clear and moist.  Eyes: Conjunctivae are normal. Pupils are equal, round, and reactive to light.  Neck: Normal range of motion. Neck supple. No JVD present.  Cardiovascular: Normal rate, regular rhythm, S1 normal, S2 normal, normal heart sounds and intact distal pulses.  Exam reveals no gallop and no friction rub.   No murmur heard. Pulmonary/Chest: Effort normal and breath sounds normal. No respiratory distress. He has no wheezes. He has no rales. He exhibits no tenderness.  Abdominal: Soft. Bowel sounds are normal. He  exhibits no distension. There is no tenderness.  Musculoskeletal: Normal range of motion. He exhibits no edema or tenderness.  Lymphadenopathy:    He has no cervical adenopathy.  Neurological: He is alert and oriented to person, place, and time. Coordination normal.  Skin: Skin is warm and dry. No rash noted. No erythema.  Psychiatric: He has a normal mood and affect. His behavior is normal. Judgment and thought content normal.      Assessment and Plan   Nursing note and vitals reviewed.

## 2015-07-03 DIAGNOSIS — J069 Acute upper respiratory infection, unspecified: Secondary | ICD-10-CM | POA: Diagnosis not present

## 2015-07-14 DIAGNOSIS — R339 Retention of urine, unspecified: Secondary | ICD-10-CM | POA: Diagnosis not present

## 2015-07-14 DIAGNOSIS — N403 Nodular prostate with lower urinary tract symptoms: Secondary | ICD-10-CM | POA: Diagnosis not present

## 2015-07-14 DIAGNOSIS — D239 Other benign neoplasm of skin, unspecified: Secondary | ICD-10-CM | POA: Diagnosis not present

## 2015-07-14 DIAGNOSIS — N138 Other obstructive and reflux uropathy: Secondary | ICD-10-CM | POA: Diagnosis not present

## 2015-07-14 DIAGNOSIS — R972 Elevated prostate specific antigen [PSA]: Secondary | ICD-10-CM | POA: Diagnosis not present

## 2015-08-13 DIAGNOSIS — D294 Benign neoplasm of scrotum: Secondary | ICD-10-CM | POA: Diagnosis not present

## 2015-09-24 DIAGNOSIS — E7800 Pure hypercholesterolemia, unspecified: Secondary | ICD-10-CM | POA: Diagnosis not present

## 2015-09-24 DIAGNOSIS — E1143 Type 2 diabetes mellitus with diabetic autonomic (poly)neuropathy: Secondary | ICD-10-CM | POA: Diagnosis not present

## 2015-09-24 DIAGNOSIS — Z96653 Presence of artificial knee joint, bilateral: Secondary | ICD-10-CM | POA: Diagnosis not present

## 2015-09-24 DIAGNOSIS — I10 Essential (primary) hypertension: Secondary | ICD-10-CM | POA: Diagnosis not present

## 2015-10-01 DIAGNOSIS — E1143 Type 2 diabetes mellitus with diabetic autonomic (poly)neuropathy: Secondary | ICD-10-CM | POA: Diagnosis not present

## 2015-10-01 DIAGNOSIS — I10 Essential (primary) hypertension: Secondary | ICD-10-CM | POA: Diagnosis not present

## 2015-10-01 DIAGNOSIS — E7800 Pure hypercholesterolemia, unspecified: Secondary | ICD-10-CM | POA: Diagnosis not present

## 2015-11-11 DIAGNOSIS — E119 Type 2 diabetes mellitus without complications: Secondary | ICD-10-CM | POA: Diagnosis not present

## 2015-11-19 ENCOUNTER — Ambulatory Visit (INDEPENDENT_AMBULATORY_CARE_PROVIDER_SITE_OTHER): Payer: PPO | Admitting: Cardiovascular Disease

## 2015-11-19 ENCOUNTER — Encounter: Payer: Self-pay | Admitting: Cardiovascular Disease

## 2015-11-19 VITALS — BP 118/58 | HR 57 | Ht 68.5 in | Wt 208.5 lb

## 2015-11-19 DIAGNOSIS — E785 Hyperlipidemia, unspecified: Secondary | ICD-10-CM

## 2015-11-19 DIAGNOSIS — E1142 Type 2 diabetes mellitus with diabetic polyneuropathy: Secondary | ICD-10-CM

## 2015-11-19 DIAGNOSIS — I25119 Atherosclerotic heart disease of native coronary artery with unspecified angina pectoris: Secondary | ICD-10-CM

## 2015-11-19 DIAGNOSIS — I10 Essential (primary) hypertension: Secondary | ICD-10-CM

## 2015-11-19 DIAGNOSIS — I5032 Chronic diastolic (congestive) heart failure: Secondary | ICD-10-CM

## 2015-11-19 MED ORDER — QUINAPRIL HCL 20 MG PO TABS: 30.0000 mg | ORAL_TABLET | Freq: Every day | ORAL | 3 refills | Status: DC

## 2015-11-19 NOTE — Patient Instructions (Signed)
Medication Instructions:   If heart rate runs low (high 40s low 50s), Cut the metoprolol in 1/2 daily  Labwork:  No new labs needed  Testing/Procedures:  No further testing at this time   Follow-Up: It was a pleasure seeing you in the office today. Please call us if you have new issues that need to be addressed before your next appt.  [phone]  Your physician wants you to follow-up in: 6 months.  You will receive a reminder letter in the mail two months in advance. If you don't receive a letter, please call our office to schedule the follow-up appointment.  If you need a refill on your cardiac medications before your next appointment, please call your pharmacy.

## 2015-11-19 NOTE — Progress Notes (Signed)
Cardiology Office Note  Date:  11/19/2015   ID:  Kevin Choi, DOB 1940-08-06, MRN [id number]  PCP:  Kevin Body, MD   Chief Complaint  Patient presents with  . OTHER    6 month f/u no complaints today. Meds reviewed verbally with pt.    HPI:  75 year old gentleman with a history of coronary artery disease, bypass surgery with a LIMA to the LAD in 2002, vein graft to the diagonal, diabetes, hypertension, hyperlipidemia, history of pleural effusion requiring a VATS, carotid endarterectomy on the right in 2001, who presents for routine followup of his coronary artery disease . He has had TIA symptoms 3 times, once in 2002, October 2012 in March 2013. Status post bilateral total knee replacements 40% left carotid arterial disease.  In follow-up today, he reports that he is doing well He does regular exercise 3 days per week, denies any symptoms of chest pain or shortness of breath on exertion Tolerating his medications without any new symptoms Takes Lasix 3 days per week Weight is down 8 pounds from his prior clinic visit  Lab work reviewed with him HBA1C 6.4 Total chol 110, LDL 52  Typically blood pressure at home  135/62 Takes hydralazine twice a day for low blood pressure  EKG on today's visit shows normal sinus rhythm with rate 57 bpm, no significant ST or T-wave changes  Other past medical history  January 2016 shows cholesterol at goal, hemoglobin A1c 6.8   May 14 2011  he had acute onset of slurred speech. He was playing cards shortly after and he was unable to do any math. Symptoms lasted for approximately one to 2 hours. Blood pressure prior to this event had been well controlled though during and after the event, he had severe hypertension with systolic pressures sometimes greater than 200. He was in the emergency room for 2 days with extensive testing.  carotid ultrasound that showed no hemodynamically significant stenosis Head CT scan showing no acute  stroke MRI of the head showing no infarction. Stable appearing area of enhancement of the right temporal lobe most compatible with meningioma Chest x-ray was essentially normal  Stress test April 2009 shows no significant ischemia. This was a pharmacologic study.   PMH:   has a past medical history of Allergic rhinitis due to pollen; CAD (coronary artery disease) (2002); Chicken pox; Diabetes ([place]); Diabetes mellitus; Elevated prostate specific antigen (PSA) (02/21/2001); Headache(784.0); Hemiplegic migraine, without mention of intractable migraine without mention of status migrainosus; HLD (hyperlipidemia); HTN (hypertension) (05/08/2007); Hyperhidrosis; Impotence of organic origin (07/24/2007); Leg pain; Measles; Mumps; Nausea with vomiting; Neuropathy ([place]); Occlusion and stenosis of carotid artery without mention of cerebral infarction (09/15/2006); Peripheral neuropathy ([place]) (01/31/2008); Peripheral vascular disease ([place]); Personal history of urinary calculi; Pleural effusion (2007); Restless legs syndrome (RLS) (01/10/2007); Slurred speech; and TIA (transient ischemic attack).  PSH:    Past Surgical History:  Procedure Laterality Date  . CAROTID ENDARTERECTOMY  2001   right  . CHOLECYSTECTOMY  2003  . collapse lung  2007  . CORONARY ARTERY BYPASS GRAFT  2001  . KNEE SURGERY Left 1981  . LUNG SURGERY  2007   Left; decortication left lung for persistant pleural effusion and PTX. admission ten days at [facility].  [name] PAROTID GLAND TUMOR EXCISION  1973  . VASECTOMY      Current Outpatient Prescriptions  Medication Sig Dispense Refill  . atorvastatin (LIPITOR) 40 MG tablet Take 40 mg by mouth daily.    . cetirizine (ZYRTEC) 10  MG tablet Take 10 mg by mouth as needed.     . diphenoxylate-atropine (LOMOTIL) 2.5-0.025 MG per tablet Take 1 tablet by mouth 4 (four) times daily as needed for diarrhea or loose stools. 30 tablet 0  . finasteride (PROSCAR) 5 MG tablet Take 1 tablet (5 mg total) by  mouth daily. 90 tablet 3  . furosemide (LASIX) 20 MG tablet Take 1 tablet (20 mg total) by mouth 2 (two) times daily as needed. 180 tablet 3  . gabapentin (NEURONTIN) 300 MG capsule Take 300 mg by mouth 3 (three) times daily.    [name] glucose blood test strip Accuchek compact plus. Dx E11.9 -Check sugars twice a day 100 each 3  . hydrALAZINE (APRESOLINE) 100 MG tablet Take 1 tablet (100 mg total) by mouth 3 (three) times daily. (Patient taking differently: Take 100 mg by mouth 2 (two) times daily. ) 270 tablet 3  . magnesium gluconate (MAGONATE) 500 MG tablet Take 500 mg by mouth daily.    . metFORMIN (GLUCOPHAGE) 500 MG tablet Take 500 mg by mouth 2 (two) times daily with a meal.    . metoprolol succinate (TOPROL-XL) 50 MG 24 hr tablet Take one and half tablet daily. (Patient taking differently: Take 50 mg by mouth daily. ) 135 tablet 3  . Multiple Vitamins-Minerals (MULTIVITAL) tablet Take 1 tablet by mouth daily.      . nitroGLYCERIN (NITROSTAT) 0.4 MG SL tablet Place 0.4 mg under the tongue every 5 (five) minutes as needed.      . Omega-3 Fatty Acids (FISH OIL) 1000 MG CAPS Take by mouth 2 (two) times daily.      . pramipexole (MIRAPEX) 0.25 MG tablet Take 1 tablet (0.25 mg total) by mouth 3 (three) times daily. 270 tablet 3  . promethazine (PHENERGAN) 12.5 MG tablet Take 1 tablet (12.5 mg total) by mouth every 8 (eight) hours as needed for nausea. 20 tablet 0  . quinapril (ACCUPRIL) 20 MG tablet Take 1.5 tablets (30 mg total) by mouth daily. 135 tablet 3  . Rivaroxaban (XARELTO) 20 MG TABS tablet Take 1 tablet (20 mg total) by mouth daily. 90 tablet 3   No current facility-administered medications for this visit.      Allergies:   Other   Social History:  The patient  reports that he has quit smoking. His smoking use included Cigarettes. He has a 20.00 pack-year smoking history. He has never used smokeless tobacco. He reports that he does not drink alcohol or use drugs.   Family History:    family history includes Angina in his father; Anxiety disorder in his brother; Cancer in his father and other; Colon polyps in his sister; Coronary artery disease in his other; Emphysema in his father; Heart attack in his brother and mother; Heart disease in his maternal aunt; Heart failure in his father; Lymphoma in his sister.    Review of Systems: Review of Systems  Constitutional: Negative.   Respiratory: Negative.   Cardiovascular: Negative.   Gastrointestinal: Negative.   Musculoskeletal: Negative.   Neurological: Negative.   Psychiatric/Behavioral: Negative.   All other systems reviewed and are negative.    PHYSICAL EXAM: VS:  BP (!) 118/58 (BP Location: Left Arm, Patient Position: Sitting, Cuff Size: Normal)   Pulse (!) 57   Ht 5' 8.5" (1.74 m)   Wt 208 lb 8 oz (94.6 kg)   BMI 31.24 kg/m  , BMI Choi mass index is 31.24 kg/m. GEN: Well nourished, well developed, in no acute  distress  HEENT: normal  Neck: no JVD, carotid bruits, or masses Cardiac: RRR; no murmurs, rubs, or gallops,no edema  Respiratory:  clear to auscultation bilaterally, normal work of breathing GI: soft, nontender, nondistended, + BS MS: no deformity or atrophy  Skin: warm and dry, no rash Neuro:  Strength and sensation are intact Psych: euthymic mood, full affect    Recent Labs: No results found for requested labs within last 8760 hours.    Lipid Panel Lab Results  Component Value Date   CHOL 147 06/06/2013   HDL 37.90 (L) 06/06/2013   LDLCALC 61 06/06/2013   TRIG 243.0 (H) 06/06/2013      Wt Readings from Last 3 Encounters:  11/19/15 208 lb 8 oz (94.6 kg)  05/18/15 217 lb 8 oz (98.7 kg)  11/17/14 216 lb (98 kg)       ASSESSMENT AND PLAN:  Essential hypertension - Plan: EKG 12-Lead Blood pressure is well controlled on today's visit. No changes made to the medications.  Atherosclerosis of native coronary artery with angina pectoris, unspecified whether native or transplanted  heart ([place]) - Plan: EKG [id number]  Diastolic CHF, chronic ([place]) - Plan: EKG 12-Lead Takes Lasix 3 days per week Appears euvolemic  Carotid disease 40% disease on the left in 2015  Hyperlipidemia Cholesterol is at goal on the current lipid regimen. No changes to the medications were made.   Total encounter time more than 15 minutes  Greater than 50% was spent in counseling and coordination of care with the patient    Disposition:   F/U  6 months   Orders Placed This Encounter  Procedures  . EKG 12-Lead     Signed, Esmond Plants, M.D., Ph.D. 11/19/2015  [place], [place]

## 2015-11-20 ENCOUNTER — Telehealth: Payer: Self-pay | Admitting: Cardiovascular Disease

## 2015-11-20 NOTE — Telephone Encounter (Signed)
Patient is due for a Carotid Ultrasound. Called patient to schedule and Delia stated that he was just recently in to see Dr. Rockey Situ and nothing was said. Does this patient still need the carotid?

## 2015-11-22 NOTE — Telephone Encounter (Signed)
Stable 40% stenosis on the left in 09/2013 Suspect it will be approx the same, Could delay a repeat u/s Certainly his choice if he would like to have it done

## 2015-11-23 NOTE — Telephone Encounter (Signed)
Spoke w/ pt.  Advised him of Dr. Donivan Scull recommendation.  He states that he would prefer to wait on his carotid u/s. Asked him to call back if/when he is ready to schedule this.

## 2015-12-21 ENCOUNTER — Telehealth: Payer: Self-pay | Admitting: Cardiovascular Disease

## 2015-12-21 NOTE — Telephone Encounter (Signed)
Pt states he started taking Osteo BioFlex, for his hip joint. He is asking if this will interfere with him taking Xarelto. Please call and advise.

## 2015-12-21 NOTE — Telephone Encounter (Signed)
Left detailed message on pt's vm that I do not have any info on potential interactions b/t xarelto & osteo-biflex. Advised him to speak w/ his pharmacist, as well.  Asked him to call back w/ any other questions or concerns.

## 2015-12-31 DIAGNOSIS — M5441 Lumbago with sciatica, right side: Secondary | ICD-10-CM | POA: Diagnosis not present

## 2015-12-31 DIAGNOSIS — M5416 Radiculopathy, lumbar region: Secondary | ICD-10-CM | POA: Diagnosis not present

## 2015-12-31 DIAGNOSIS — M5442 Lumbago with sciatica, left side: Secondary | ICD-10-CM | POA: Diagnosis not present

## 2015-12-31 DIAGNOSIS — M7062 Trochanteric bursitis, left hip: Secondary | ICD-10-CM | POA: Diagnosis not present

## 2015-12-31 DIAGNOSIS — M25552 Pain in left hip: Secondary | ICD-10-CM | POA: Diagnosis not present

## 2016-03-29 DIAGNOSIS — I10 Essential (primary) hypertension: Secondary | ICD-10-CM | POA: Diagnosis not present

## 2016-03-29 DIAGNOSIS — E7800 Pure hypercholesterolemia, unspecified: Secondary | ICD-10-CM | POA: Diagnosis not present

## 2016-03-29 DIAGNOSIS — E1143 Type 2 diabetes mellitus with diabetic autonomic (poly)neuropathy: Secondary | ICD-10-CM | POA: Diagnosis not present

## 2016-04-04 DIAGNOSIS — D1800 Hemangioma unspecified site: Secondary | ICD-10-CM | POA: Diagnosis not present

## 2016-04-04 DIAGNOSIS — Z1283 Encounter for screening for malignant neoplasm of skin: Secondary | ICD-10-CM | POA: Diagnosis not present

## 2016-04-04 DIAGNOSIS — D229 Melanocytic nevi, unspecified: Secondary | ICD-10-CM | POA: Diagnosis not present

## 2016-04-04 DIAGNOSIS — L812 Freckles: Secondary | ICD-10-CM | POA: Diagnosis not present

## 2016-04-04 DIAGNOSIS — L578 Other skin changes due to chronic exposure to nonionizing radiation: Secondary | ICD-10-CM | POA: Diagnosis not present

## 2016-04-04 DIAGNOSIS — L821 Other seborrheic keratosis: Secondary | ICD-10-CM | POA: Diagnosis not present

## 2016-04-05 DIAGNOSIS — E1143 Type 2 diabetes mellitus with diabetic autonomic (poly)neuropathy: Secondary | ICD-10-CM | POA: Diagnosis not present

## 2016-04-05 DIAGNOSIS — Z7189 Other specified counseling: Secondary | ICD-10-CM | POA: Insufficient documentation

## 2016-04-05 DIAGNOSIS — Z0000 Encounter for general adult medical examination without abnormal findings: Secondary | ICD-10-CM | POA: Diagnosis not present

## 2016-04-05 DIAGNOSIS — Z7185 Encounter for immunization safety counseling: Secondary | ICD-10-CM | POA: Insufficient documentation

## 2016-04-05 DIAGNOSIS — I10 Essential (primary) hypertension: Secondary | ICD-10-CM | POA: Diagnosis not present

## 2016-04-05 DIAGNOSIS — E7800 Pure hypercholesterolemia, unspecified: Secondary | ICD-10-CM | POA: Diagnosis not present

## 2016-04-18 DIAGNOSIS — J069 Acute upper respiratory infection, unspecified: Secondary | ICD-10-CM | POA: Diagnosis not present

## 2016-04-19 DIAGNOSIS — R05 Cough: Secondary | ICD-10-CM | POA: Diagnosis not present

## 2016-04-22 DIAGNOSIS — R938 Abnormal findings on diagnostic imaging of other specified body structures: Secondary | ICD-10-CM | POA: Diagnosis not present

## 2016-05-23 ENCOUNTER — Ambulatory Visit: Payer: PPO | Admitting: Cardiovascular Disease

## 2016-05-31 NOTE — Progress Notes (Signed)
Cardiology Office Note  Date:  06/02/2016   ID:  BRAIN HONEYCUTT, DOB 06-19-1940, MRN [id number]  PCP:  [name], MD   Chief Complaint  Patient presents with  . other    42mo f/u. Pt c/o soboe, concerned about recent xray dx of significant calcification of abdominal aorta; denies cp. Reviewed meds with pt verbally.    HPI:  76 year old gentleman with a history of  coronary artery disease,  bypass surgery with a LIMA to the LAD in 2002,  vein graft to the diagonal,  diabetes,  hypertension,  hyperlipidemia,  pleural effusion requiring a VATS,  carotid endarterectomy on the right in 2001,  TIA symptoms 3 times, once in 2002, October 2012 in March 2013. Status post bilateral total knee replacements 40% left carotid arterial disease who presents for routine followup of his coronary artery disease .  In follow-up today, he reports that he Has some very mild shortness of breath Some concern about his blood pressure, 70% of the time is less than 309 systolic  occasionally runs higher Seem to be higher when he was sick in March with flulike symptoms Now is Active, gardening, Helping family with a beach house, restoration  Stopped regular exercise 3 days per week,  denies any symptoms of chest pain   Takes Lasix 3 days per week Takes hydralazine 100 BID, Occasionally takes dose in the middle of the day for high pressure  Worried about aortic calcification only scan  Lab work reviewed with him HBA1C 6.4 Total chol 110, LDL 52  EKG personally reviewed by myself on todays visit shows normal sinus rhythm with rate 58 bpm, New left bundle branch block  Other past medical history  May 14 2011  he had acute onset of slurred speech. He was playing cards shortly after and he was unable to do any math. Symptoms lasted for approximately one to 2 hours. Blood pressure prior to this event had been well controlled though during and after the event, he had severe hypertension with  systolic pressures sometimes greater than 200. He was in the emergency room for 2 days with extensive testing.  carotid ultrasound that showed no hemodynamically significant stenosis Head CT scan showing no acute stroke MRI of the head showing no infarction. Stable appearing area of enhancement of the right temporal lobe most compatible with meningioma Chest x-ray was essentially normal  Stress test April 2009 shows no significant ischemia. This was a pharmacologic study.   PMH:   has a past medical history of Allergic rhinitis due to pollen; CAD (coronary artery disease) (2002); Chicken pox; Diabetes ([place]); Diabetes mellitus; Elevated prostate specific antigen (PSA) (02/21/2001); Headache(784.0); Hemiplegic migraine, without mention of intractable migraine without mention of status migrainosus; HLD (hyperlipidemia); HTN (hypertension) (05/08/2007); Hyperhidrosis; Impotence of organic origin (07/24/2007); Leg pain; Measles; Mumps; Nausea with vomiting; Neuropathy ([place]); Occlusion and stenosis of carotid artery without mention of cerebral infarction (09/15/2006); Peripheral neuropathy ([place]) (01/31/2008); Peripheral vascular disease ([place]); Personal history of urinary calculi; Pleural effusion (2007); Restless legs syndrome (RLS) (01/10/2007); Slurred speech; and TIA (transient ischemic attack).  PSH:    Past Surgical History:  Procedure Laterality Date  . CAROTID ENDARTERECTOMY  2001   right  . CHOLECYSTECTOMY  2003  . collapse lung  2007  . CORONARY ARTERY BYPASS GRAFT  2001  . KNEE SURGERY Left 1981  . LUNG SURGERY  2007   Left; decortication left lung for persistant pleural effusion and PTX. admission ten days at [facility].  [name] PAROTID  GLAND TUMOR EXCISION  1973  . VASECTOMY      Current Outpatient Prescriptions  Medication Sig Dispense Refill  . atorvastatin (LIPITOR) 40 MG tablet Take 40 mg by mouth daily.    . cetirizine (ZYRTEC) 10 MG tablet Take 10 mg by mouth as needed.     .  diphenoxylate-atropine (LOMOTIL) 2.5-0.025 MG per tablet Take 1 tablet by mouth 4 (four) times daily as needed for diarrhea or loose stools. 30 tablet 0  . finasteride (PROSCAR) 5 MG tablet Take 1 tablet (5 mg total) by mouth daily. 90 tablet 3  . furosemide (LASIX) 20 MG tablet Take 1 tablet (20 mg total) by mouth 2 (two) times daily as needed. 180 tablet 3  . gabapentin (NEURONTIN) 300 MG capsule Take 300 mg by mouth 3 (three) times daily.    [name] glucose blood test strip Accuchek compact plus. Dx E11.9 -Check sugars twice a day 100 each 3  . hydrALAZINE (APRESOLINE) 100 MG tablet Take 100 mg by mouth 2 (two) times daily.    . magnesium gluconate (MAGONATE) 500 MG tablet Take 500 mg by mouth daily.    . metFORMIN (GLUCOPHAGE) 500 MG tablet Take 500 mg by mouth 2 (two) times daily with a meal.    . metoprolol succinate (TOPROL-XL) 50 MG 24 hr tablet Take 50 mg by mouth daily. Take with or immediately following a meal.    . Multiple Vitamins-Minerals (MULTIVITAL) tablet Take 1 tablet by mouth daily.      . nitroGLYCERIN (NITROSTAT) 0.4 MG SL tablet Place 0.4 mg under the tongue every 5 (five) minutes as needed.      . Omega-3 Fatty Acids (FISH OIL) 1000 MG CAPS Take by mouth 2 (two) times daily.      . pramipexole (MIRAPEX) 0.25 MG tablet Take 1 tablet (0.25 mg total) by mouth 3 (three) times daily. 270 tablet 3  . promethazine (PHENERGAN) 12.5 MG tablet Take 1 tablet (12.5 mg total) by mouth every 8 (eight) hours as needed for nausea. 20 tablet 0  . quinapril (ACCUPRIL) 20 MG tablet Take 1.5 tablets (30 mg total) by mouth daily. 135 tablet 3  . Rivaroxaban (XARELTO) 20 MG TABS tablet Take 1 tablet (20 mg total) by mouth daily. 90 tablet 3   No current facility-administered medications for this visit.      Allergies:   Other   Social History:  The patient  reports that he has quit smoking. His smoking use included Cigarettes. He has a 20.00 pack-year smoking history. He has never used smokeless  tobacco. He reports that he does not drink alcohol or use drugs.   Family History:   family history includes Angina in his father; Anxiety disorder in his brother; Cancer in his father and other; Colon polyps in his sister; Coronary artery disease in his other; Emphysema in his father; Heart attack in his brother and mother; Heart disease in his maternal aunt; Heart failure in his father; Lymphoma in his sister.    Review of Systems: Review of Systems  Constitutional: Negative.   Respiratory: Positive for shortness of breath.   Cardiovascular: Negative.   Gastrointestinal: Negative.   Musculoskeletal: Negative.   Neurological: Negative.   Psychiatric/Behavioral: Negative.   All other systems reviewed and are negative.    PHYSICAL EXAM: VS:  BP 130/68 (BP Location: Left Arm, Patient Position: Sitting, Cuff Size: Normal)   Pulse (!) 58   Ht 5' 8.5" (1.74 m)   Wt 212 lb 8 oz (96.4  kg)   BMI 31.84 kg/m  , BMI Body mass index is 31.84 kg/m. GEN: Well nourished, well developed, in no acute distress  HEENT: normal  Neck: no JVD, carotid bruits, or masses Cardiac: RRR; no murmurs, rubs, or gallops,no edema  Respiratory:  clear to auscultation bilaterally, normal work of breathing GI: soft, nontender, nondistended, + BS MS: no deformity or atrophy  Skin: warm and dry, no rash Neuro:  Strength and sensation are intact Psych: euthymic mood, full affect    Recent Labs: No results found for requested labs within last 8760 hours.    Lipid Panel Lab Results  Component Value Date   CHOL 147 06/06/2013   HDL 37.90 (L) 06/06/2013   LDLCALC 61 06/06/2013   TRIG 243.0 (H) 06/06/2013      Wt Readings from Last 3 Encounters:  06/02/16 212 lb 8 oz (96.4 kg)  11/19/15 208 lb 8 oz (94.6 kg)  05/18/15 217 lb 8 oz (98.7 kg)       ASSESSMENT AND PLAN:  Essential hypertension - Plan: EKG 12-Lead Recommended he stay on his current regimen Consider checking blood pressure later in  the day not just the morning If blood pressure runs high at 3:00 in the afternoon may need to take low-dose hydralazine middle of the day  Atherosclerosis of native coronary artery with angina pectoris, unspecified whether native or transplanted heart ([place]) -  New left bundle branch block, some shortness of breath History of bypass surgery Last stress test 9 years ago. We will order new perfusion scan, lexiscan Myoview  Diastolic CHF, chronic ([place]) - Plan: EKG 12-Lead Takes Lasix 3 days per week Appears euvolemic  Carotid disease 40% disease on the left in 2015  Hyperlipidemia Cholesterol is at goal on the current lipid regimen. No changes to the medications were made.  Left bundle branch block, new Long discussion concerning implications Some shortness of breath, denies chest pain As above, last stress test 9 years ago . We will orderMyoview   Total encounter time more than 25 minutes  Greater than 50% was spent in counseling and coordination of care with the patient   Disposition:   F/U  6 months   No orders of the defined types were placed in this encounter.    Signed, Esmond Plants, M.D., Ph.D. 06/02/2016  [place], [place]

## 2016-06-02 ENCOUNTER — Encounter: Payer: Self-pay | Admitting: Cardiovascular Disease

## 2016-06-02 ENCOUNTER — Ambulatory Visit (INDEPENDENT_AMBULATORY_CARE_PROVIDER_SITE_OTHER): Payer: PPO | Admitting: Cardiovascular Disease

## 2016-06-02 VITALS — BP 130/68 | HR 58 | Ht 68.5 in | Wt 212.5 lb

## 2016-06-02 DIAGNOSIS — E782 Mixed hyperlipidemia: Secondary | ICD-10-CM | POA: Diagnosis not present

## 2016-06-02 DIAGNOSIS — R0602 Shortness of breath: Secondary | ICD-10-CM | POA: Diagnosis not present

## 2016-06-02 DIAGNOSIS — Z951 Presence of aortocoronary bypass graft: Secondary | ICD-10-CM | POA: Diagnosis not present

## 2016-06-02 DIAGNOSIS — E118 Type 2 diabetes mellitus with unspecified complications: Secondary | ICD-10-CM

## 2016-06-02 DIAGNOSIS — G459 Transient cerebral ischemic attack, unspecified: Secondary | ICD-10-CM

## 2016-06-02 DIAGNOSIS — I6523 Occlusion and stenosis of bilateral carotid arteries: Secondary | ICD-10-CM

## 2016-06-02 DIAGNOSIS — I5032 Chronic diastolic (congestive) heart failure: Secondary | ICD-10-CM

## 2016-06-02 DIAGNOSIS — I447 Left bundle-branch block, unspecified: Secondary | ICD-10-CM | POA: Diagnosis not present

## 2016-06-02 DIAGNOSIS — E1142 Type 2 diabetes mellitus with diabetic polyneuropathy: Secondary | ICD-10-CM | POA: Diagnosis not present

## 2016-06-02 DIAGNOSIS — I10 Essential (primary) hypertension: Secondary | ICD-10-CM | POA: Diagnosis not present

## 2016-06-02 NOTE — Patient Instructions (Addendum)
Medication Instructions:   No medication changes made  Labwork:  No new labs needed  Testing/Procedures:  We will order a lexiscan myoview for new LBBB and SOB, known CAD, CABG  Organ  Your caregiver has ordered a Stress Test with nuclear imaging. The purpose of this test is to evaluate the blood supply to your heart muscle. This procedure is referred to as a "Non-Invasive Stress Test." This is because other than having an IV started in your vein, nothing is inserted or "invades" your body. Cardiac stress tests are done to find areas of poor blood flow to the heart by determining the extent of coronary artery disease (CAD). Some patients exercise on a treadmill, which naturally increases the blood flow to your heart, while others who are  unable to walk on a treadmill due to physical limitations have a pharmacologic/chemical stress agent called Lexiscan . This medicine will mimic walking on a treadmill by temporarily increasing your coronary blood flow.   Please note: these test may take anywhere between 2-4 hours to complete  PLEASE REPORT TO [place] AT THE FIRST DESK WILL DIRECT YOU WHERE TO GO  Date of Procedure:_Wednesday June 08, 2016 at 09:00AM__  Arrival Time for Procedure:_Arrive at 08:45AM to register__  Instructions regarding medication:   __X__ : Hold diabetes medication Metformin (Glucophage) morning of procedure  __X__:  Hold metoprolol the night before procedure and morning of procedure   PLEASE NOTIFY THE OFFICE AT LEAST 24 HOURS IN ADVANCE IF YOU ARE UNABLE TO KEEP YOUR APPOINTMENT.  [phone] AND  PLEASE NOTIFY NUCLEAR MEDICINE AT [facility] AT LEAST 24 HOURS IN ADVANCE IF YOU ARE UNABLE TO KEEP YOUR APPOINTMENT. [phone]  How to prepare for your Myoview test:  1. Do not eat or drink after midnight 2. No caffeine for 24 hours prior to test 3. No smoking 24 hours prior to test. 4. Your medication may be taken with  water.  If your doctor stopped a medication because of this test, do not take that medication. 5. Ladies, please do not wear dresses.  Skirts or pants are appropriate. Please wear a short sleeve shirt. 6. No perfume, cologne or lotion. 7. Wear comfortable walking shoes. No heels!   I recommend watching educational videos on topics of interest to you at:       [email or web address]  Enter code: [email or web address]    Follow-Up: It was a pleasure seeing you in the office today. Please call us if you have new issues that need to be addressed before your next appt.  [phone]  Your physician wants you to follow-up in: 6 months.  You will receive a reminder letter in the mail two months in advance. If you don't receive a letter, please call our office to schedule the follow-up appointment.  If you need a refill on your cardiac medications before your next appointment, please call your pharmacy.

## 2016-06-08 ENCOUNTER — Encounter
Admission: RE | Admit: 2016-06-08 | Discharge: 2016-06-08 | Disposition: A | Discharge status: Home / Self Care | Payer: PPO | Source: Ambulatory Visit | Attending: Cardiovascular Disease | Admitting: Cardiovascular Disease

## 2016-06-08 DIAGNOSIS — R0602 Shortness of breath: Secondary | ICD-10-CM | POA: Diagnosis not present

## 2016-06-08 DIAGNOSIS — I6523 Occlusion and stenosis of bilateral carotid arteries: Secondary | ICD-10-CM | POA: Diagnosis not present

## 2016-06-08 DIAGNOSIS — E1142 Type 2 diabetes mellitus with diabetic polyneuropathy: Secondary | ICD-10-CM | POA: Insufficient documentation

## 2016-06-08 DIAGNOSIS — I447 Left bundle-branch block, unspecified: Secondary | ICD-10-CM | POA: Diagnosis not present

## 2016-06-08 DIAGNOSIS — E118 Type 2 diabetes mellitus with unspecified complications: Secondary | ICD-10-CM | POA: Insufficient documentation

## 2016-06-08 DIAGNOSIS — Z951 Presence of aortocoronary bypass graft: Secondary | ICD-10-CM | POA: Insufficient documentation

## 2016-06-08 DIAGNOSIS — I5032 Chronic diastolic (congestive) heart failure: Secondary | ICD-10-CM

## 2016-06-08 DIAGNOSIS — I10 Essential (primary) hypertension: Secondary | ICD-10-CM | POA: Diagnosis not present

## 2016-06-08 DIAGNOSIS — E782 Mixed hyperlipidemia: Secondary | ICD-10-CM | POA: Insufficient documentation

## 2016-06-08 DIAGNOSIS — G459 Transient cerebral ischemic attack, unspecified: Secondary | ICD-10-CM | POA: Diagnosis not present

## 2016-06-08 LAB — NM MYOCAR MULTI W/SPECT W/WALL MOTION / EF
CHL CUP RESTING HR STRESS: 62 {beats}/min
CSEPEW: 1 METS
Exercise duration (min): 0 min
Exercise duration (sec): 0 s
LV dias vol: 129 mL (ref 62–150)
LVSYSVOL: 40 mL
MPHR: 143 {beats}/min
NUC STRESS TID: 1.07
Peak HR: 97 {beats}/min
Percent HR: 67 %
SDS: 0
SRS: 3
SSS: 0

## 2016-06-08 MED ORDER — TECHNETIUM TC 99M TETROFOSMIN IV KIT
30.0000 | PACK | Freq: Once | INTRAVENOUS | Status: AC | PRN
  Administered 2016-06-08: 30.803 via INTRAVENOUS

## 2016-06-08 MED ORDER — REGADENOSON 0.4 MG/5ML IV SOLN
0.4000 mg | Freq: Once | INTRAVENOUS | Status: AC
  Administered 2016-06-08: 0.4 mg via INTRAVENOUS
  Filled 2016-06-08: qty 5

## 2016-06-08 MED ORDER — TECHNETIUM TC 99M TETROFOSMIN IV KIT
12.6100 | PACK | Freq: Once | INTRAVENOUS | Status: AC | PRN
Start: 2016-06-08 — End: 2016-06-08
  Administered 2016-06-08: 12.61 via INTRAVENOUS

## 2016-06-17 ENCOUNTER — Encounter: Payer: Self-pay | Admitting: Emergency Medicine

## 2016-06-17 ENCOUNTER — Emergency Department: Payer: PPO

## 2016-06-17 ENCOUNTER — Emergency Department
Admission: EM | Admit: 2016-06-17 | Discharge: 2016-06-17 | Disposition: A | Discharge status: Home / Self Care | Payer: PPO | Attending: Emergency Medicine | Admitting: Emergency Medicine

## 2016-06-17 DIAGNOSIS — Z87891 Personal history of nicotine dependence: Secondary | ICD-10-CM | POA: Diagnosis not present

## 2016-06-17 DIAGNOSIS — Z79899 Other long term (current) drug therapy: Secondary | ICD-10-CM | POA: Diagnosis not present

## 2016-06-17 DIAGNOSIS — I5032 Chronic diastolic (congestive) heart failure: Secondary | ICD-10-CM | POA: Diagnosis not present

## 2016-06-17 DIAGNOSIS — R1111 Vomiting without nausea: Secondary | ICD-10-CM | POA: Diagnosis not present

## 2016-06-17 DIAGNOSIS — Z7984 Long term (current) use of oral hypoglycemic drugs: Secondary | ICD-10-CM | POA: Diagnosis not present

## 2016-06-17 DIAGNOSIS — R51 Headache: Secondary | ICD-10-CM | POA: Diagnosis not present

## 2016-06-17 DIAGNOSIS — I2510 Atherosclerotic heart disease of native coronary artery without angina pectoris: Secondary | ICD-10-CM | POA: Insufficient documentation

## 2016-06-17 DIAGNOSIS — I110 Hypertensive heart disease with heart failure: Secondary | ICD-10-CM | POA: Insufficient documentation

## 2016-06-17 DIAGNOSIS — E119 Type 2 diabetes mellitus without complications: Secondary | ICD-10-CM | POA: Insufficient documentation

## 2016-06-17 DIAGNOSIS — G43801 Other migraine, not intractable, with status migrainosus: Secondary | ICD-10-CM | POA: Insufficient documentation

## 2016-06-17 LAB — URINALYSIS, COMPLETE (UACMP) WITH MICROSCOPIC
BACTERIA UA: NONE SEEN
BILIRUBIN URINE: NEGATIVE
GLUCOSE, UA: 50 mg/dL — AB
Ketones, ur: NEGATIVE mg/dL
LEUKOCYTES UA: NEGATIVE
Nitrite: NEGATIVE
Protein, ur: 100 mg/dL — AB
SPECIFIC GRAVITY, URINE: 1.026 (ref 1.005–1.030)
pH: 7 (ref 5.0–8.0)

## 2016-06-17 LAB — COMPREHENSIVE METABOLIC PANEL
ALT: 20 U/L (ref 17–63)
AST: 29 U/L (ref 15–41)
Albumin: 3.9 g/dL (ref 3.5–5.0)
Alkaline Phosphatase: 61 U/L (ref 38–126)
Anion gap: 10 (ref 5–15)
BILIRUBIN TOTAL: 0.8 mg/dL (ref 0.3–1.2)
BUN: 18 mg/dL (ref 6–20)
CO2: 23 mmol/L (ref 22–32)
CREATININE: 0.89 mg/dL (ref 0.61–1.24)
Calcium: 8.9 mg/dL (ref 8.9–10.3)
Chloride: 103 mmol/L (ref 101–111)
Glucose, Bld: 172 mg/dL — ABNORMAL HIGH (ref 65–99)
Potassium: 4.4 mmol/L (ref 3.5–5.1)
Sodium: 136 mmol/L (ref 135–145)
Total Protein: 7 g/dL (ref 6.5–8.1)

## 2016-06-17 LAB — LIPASE, BLOOD: Lipase: 22 U/L (ref 11–51)

## 2016-06-17 LAB — CBC
HEMATOCRIT: 40.7 % (ref 40.0–52.0)
HEMOGLOBIN: 13.9 g/dL (ref 13.0–18.0)
MCH: 29.4 pg (ref 26.0–34.0)
MCHC: 34.3 g/dL (ref 32.0–36.0)
MCV: 85.7 fL (ref 80.0–100.0)
Platelets: 205 10*3/uL (ref 150–440)
RBC: 4.75 MIL/uL (ref 4.40–5.90)
RDW: 14.8 % — AB (ref 11.5–14.5)
WBC: 10.6 10*3/uL (ref 3.8–10.6)

## 2016-06-17 MED ORDER — PRAMIPEXOLE DIHYDROCHLORIDE 0.25 MG PO TABS
0.2500 mg | ORAL_TABLET | Freq: Once | ORAL | Status: AC
  Administered 2016-06-17: 0.25 mg via ORAL
  Filled 2016-06-17: qty 1

## 2016-06-17 MED ORDER — METOCLOPRAMIDE HCL 5 MG/ML IJ SOLN
10.0000 mg | Freq: Once | INTRAMUSCULAR | Status: AC
  Administered 2016-06-17: 10 mg via INTRAVENOUS
  Filled 2016-06-17: qty 2

## 2016-06-17 MED ORDER — SODIUM CHLORIDE 0.9 % IV BOLUS (SEPSIS)
500.0000 mL | Freq: Once | INTRAVENOUS | Status: AC
  Administered 2016-06-17: 500 mL via INTRAVENOUS

## 2016-06-17 MED ORDER — METOCLOPRAMIDE HCL 10 MG PO TABS: 10.0000 mg | ORAL_TABLET | Freq: Three times a day (TID) | ORAL | 0 refills | Status: DC | PRN

## 2016-06-17 MED ORDER — ACETAMINOPHEN 500 MG PO TABS
1000.0000 mg | ORAL_TABLET | Freq: Once | ORAL | Status: AC
  Administered 2016-06-17: 1000 mg via ORAL
  Filled 2016-06-17: qty 2

## 2016-06-17 MED ORDER — DIPHENHYDRAMINE HCL 50 MG/ML IJ SOLN
25.0000 mg | Freq: Once | INTRAMUSCULAR | Status: AC
  Administered 2016-06-17: 25 mg via INTRAVENOUS
  Filled 2016-06-17: qty 1

## 2016-06-17 NOTE — ED Provider Notes (Signed)
[facility] Emergency Department Provider Note  ____________________________________________  Time seen: Approximately 12:00 PM  I have reviewed the triage vital signs and the nursing notes.   HISTORY  Chief Complaint Headache and Emesis   HPI MEMPHIS DECOTEAU is a 76 y.o. male history of CAD, hemiplegic migraine headaches, hypertension, hyperlipidemia, TIA, carotid artery disease, peripheral vascular disease on [place] presents for evaluation of headache. Patient reports that he woke up at 1 AM with a severe frontal throbbing headache. He took 2 extra strength Tylenol. An hour later he woke up vomiting. Has had several episodes of nonbloody nonbilious emesis. He continues to have the headache this morning. He reports his headaches currently 410, located in the frontal region, constant and nonradiating, associated with photophobia and phonophobia and nausea. Patient has been seen by neurology for these headaches and has been diagnosed as migraines. He has had for the last 15-20 years a history of migraine headaches with visual aura. No facial droop, no slurred speech, no changes in vision, no difficulty finding words, no weakness or numbness of his extremities, no gait instability. He reports that this headache is exactly like his migraines.  Past Medical History:  Diagnosis Date  . Allergic rhinitis due to pollen   . CAD (coronary artery disease) 2002   bypass  . Chicken pox   . Diabetes ([place])   . Diabetes mellitus   . Elevated prostate specific antigen (PSA) 02/21/2001  . Headache(784.0)   . Hemiplegic migraine, without mention of intractable migraine without mention of status migrainosus   . HLD (hyperlipidemia)   . HTN (hypertension) 05/08/2007  . Hyperhidrosis   . Impotence of organic origin 07/24/2007  . Leg pain   . Measles   . Mumps   . Nausea with vomiting   . Neuropathy   . Occlusion and stenosis of carotid artery without mention of cerebral  infarction 09/15/2006  . Peripheral neuropathy 01/31/2008  . Peripheral vascular disease ([place])   . Personal history of urinary calculi   . Pleural effusion 2007   with pneumothorax   . Restless legs syndrome (RLS) 01/10/2007  . Slurred speech   . TIA (transient ischemic attack)    Patient stated 2 months ago    Patient Active Problem List   Diagnosis Date Noted  . Diastolic CHF, chronic ([place]) 05/18/2015  . Bilateral carotid artery stenosis 05/20/2014  . Preop cardiovascular exam 10/31/2013  . Medication management 03/07/2013  . Diabetic peripheral neuropathy associated with type 2 diabetes mellitus ([place]) 01/23/2013  . Routine general medical examination at a health care facility 10/09/2012  . Hypersomnolence 06/18/2012  . Migraine 12/15/2011  . Need for prophylactic vaccination and inoculation against influenza 12/15/2011  . TIA (transient ischemic attack) 02/11/2011  . Diabetes mellitus type 2 with complications ([place]) 84/13/2440  . Hyperlipidemia 06/05/2009  . Essential hypertension 06/05/2009  . Coronary atherosclerosis 06/05/2009  . PLEURAL EFFUSION 06/05/2009    Past Surgical History:  Procedure Laterality Date  . CAROTID ENDARTERECTOMY  2001   right  . CHOLECYSTECTOMY  2003  . collapse lung  2007  . CORONARY ARTERY BYPASS GRAFT  2001  . KNEE SURGERY Left 1981  . LUNG SURGERY  2007   Left; decortication left lung for persistant pleural effusion and PTX. admission ten days at [facility].  [name] PAROTID GLAND TUMOR EXCISION  1973  . VASECTOMY      Prior to Admission medications   Medication Sig Start Date End Date Taking? Authorizing Provider  atorvastatin (  LIPITOR) 40 MG tablet Take 40 mg by mouth daily.   Yes Historical Provider, MD  cetirizine (ZYRTEC) 10 MG tablet Take 10 mg by mouth as needed.    Yes Historical Provider, MD  diphenoxylate-atropine (LOMOTIL) 2.5-0.025 MG per tablet Take 1 tablet by mouth 4 (four) times daily as needed for diarrhea or loose stools. 03/07/13   Yes Raquel Dagoberto Ligas, NP  finasteride (PROSCAR) 5 MG tablet Take 1 tablet (5 mg total) by mouth daily. 03/07/13  Yes Raquel Dagoberto Ligas, NP  furosemide (LASIX) 20 MG tablet Take 1 tablet (20 mg total) by mouth 2 (two) times daily as needed. 03/07/13  Yes Raquel Dagoberto Ligas, NP  gabapentin (NEURONTIN) 300 MG capsule Take 300 mg by mouth 3 (three) times daily.   Yes Historical Provider, MD  hydrALAZINE (APRESOLINE) 100 MG tablet Take 100 mg by mouth 2 (two) times daily.   Yes Historical Provider, MD  metFORMIN (GLUCOPHAGE) 500 MG tablet Take 500 mg by mouth 2 (two) times daily with a meal.   Yes Historical Provider, MD  metoprolol succinate (TOPROL-XL) 50 MG 24 hr tablet Take 50 mg by mouth daily. Take with or immediately following a meal.   Yes Historical Provider, MD  nitroGLYCERIN (NITROSTAT) 0.4 MG SL tablet Place 0.4 mg under the tongue every 5 (five) minutes as needed.     Yes Historical Provider, MD  pramipexole (MIRAPEX) 0.25 MG tablet Take 1 tablet (0.25 mg total) by mouth 3 (three) times daily. 03/07/13  Yes Raquel Dagoberto Ligas, NP  quinapril (ACCUPRIL) 20 MG tablet Take 1.5 tablets (30 mg total) by mouth daily. 11/19/15  Yes Minna Merritts, MD  Rivaroxaban (XARELTO) 20 MG TABS tablet Take 1 tablet (20 mg total) by mouth daily. 03/07/13  Yes Raquel M Rey, NP  glucose blood test strip Accuchek compact plus. Dx E11.9 -Check sugars twice a day 12/09/13   Crecencio Mc, MD  magnesium gluconate (MAGONATE) 500 MG tablet Take 500 mg by mouth daily.    Historical Provider, MD  metoCLOPramide (REGLAN) 10 MG tablet Take 1 tablet (10 mg total) by mouth every 8 (eight) hours as needed for nausea. 06/17/16 06/20/16  Rudene Re, MD  Multiple Vitamins-Minerals (MULTIVITAL) tablet Take 1 tablet by mouth daily.      Historical Provider, MD  Omega-3 Fatty Acids (FISH OIL) 1000 MG CAPS Take by mouth 2 (two) times daily.      Historical Provider, MD  promethazine (PHENERGAN) 12.5 MG tablet Take 1 tablet (12.5 mg total) by mouth  every 8 (eight) hours as needed for nausea. Patient not taking: Reported on 06/17/2016 03/07/13   Sherryl Barters, NP    Allergies Other  Family History  Problem Relation Age of Onset  . Heart attack Mother   . Heart failure Father   . Angina Father   . Emphysema Father   . Cancer Father     prostate  . Lymphoma Sister     Non-Hodgkins  . Cancer Other   . Coronary artery disease Other     brother  . Heart attack Brother   . Heart disease Maternal Aunt   . Colon polyps Sister   . Anxiety disorder Brother     Social History Social History  Substance Use Topics  . Smoking status: Former Smoker    Packs/day: 2.00    Years: 10.00    Types: Cigarettes  . Smokeless tobacco: Never Used  . Alcohol use No    Review of Systems  Constitutional: Negative for fever. Eyes: Negative for visual changes. ENT: Negative for sore throat. Neck: No neck pain  Cardiovascular: Negative for chest pain. Respiratory: Negative for shortness of breath. Gastrointestinal: Negative for abdominal pain,  diarrhea. + N/V Genitourinary: Negative for dysuria. Musculoskeletal: Negative for back pain. Skin: Negative for rash. Neurological: Negative for weakness or numbness. + HA Psych: No SI or HI  ____________________________________________   PHYSICAL EXAM:  VITAL SIGNS: ED Triage Vitals  Enc Vitals Group     BP 06/17/16 1105 (!) 177/76     Pulse Rate 06/17/16 1105 71     Resp 06/17/16 1105 15     Temp 06/17/16 1105 98.8 F (37.1 C)     Temp Source 06/17/16 1105 Oral     SpO2 06/17/16 1105 96 %     Weight 06/17/16 1106 210 lb (95.3 kg)     Height 06/17/16 1106 5\' 8"  (1.727 m)     Head Circumference --      Peak Flow --      Pain Score 06/17/16 1104 4     Pain Loc --      Pain Edu? --      Excl. in [place]? --     Constitutional: Alert and oriented, laying in the room with tissue covering his eyes.  HEENT:      Head: Normocephalic and atraumatic.         Eyes: Conjunctivae are normal.  Sclera is non-icteric. EOMI. PERRL      Mouth/Throat: Mucous membranes are moist.       Neck: Supple with no signs of meningismus. Cardiovascular: Regular rate and rhythm. No murmurs, gallops, or rubs. 2+ symmetrical distal pulses are present in all extremities. No JVD. Respiratory: Normal respiratory effort. Lungs are clear to auscultation bilaterally. No wheezes, crackles, or rhonchi.  Gastrointestinal: Soft, non tender, and non distended with positive bowel sounds. No rebound or guarding. Genitourinary: No CVA tenderness. Musculoskeletal: Nontender with normal range of motion in all extremities. No edema, cyanosis, or erythema of extremities. Neurologic: Normal speech and language. A & O x3, PERRL, no nystagmus, CN II-XII intact, motor testing reveals good tone and bulk throughout. There is no evidence of pronator drift or dysmetria. Muscle strength is 5/5 throughout. Deep tendon reflexes are 2+ throughout with downgoing toes. Sensory examination is intact. Gait deferred Skin: Skin is warm, dry and intact. No rash noted. Psychiatric: Mood and affect are normal. Speech and behavior are normal.  ____________________________________________   LABS (all labs ordered are listed, but only abnormal results are displayed)  Labs Reviewed  COMPREHENSIVE METABOLIC PANEL - Abnormal; Notable for the following:       Result Value   Glucose, Bld 172 (*)    All other components within normal limits  CBC - Abnormal; Notable for the following:    RDW 14.8 (*)    All other components within normal limits  URINALYSIS, COMPLETE (UACMP) WITH MICROSCOPIC - Abnormal; Notable for the following:    Color, Urine YELLOW (*)    APPearance CLEAR (*)    Glucose, UA 50 (*)    Hgb urine dipstick SMALL (*)    Protein, ur 100 (*)    Squamous Epithelial / LPF 0-5 (*)    All other components within normal limits  LIPASE, BLOOD   ____________________________________________  EKG  none    ____________________________________________  RADIOLOGY  Head CT: negative  ____________________________________________   PROCEDURES  Procedure(s) performed: None Procedures Critical Care performed:  None ____________________________________________  INITIAL IMPRESSION / ASSESSMENT AND PLAN / ED COURSE  76 y.o. male history of CAD, hemiplegic migraine headaches, hypertension, hyperlipidemia, TIA, carotid artery disease, peripheral vascular disease on Xareltowho presents for evaluation of headache since 1 AM associated with nausea, multiple episodes of nonbloody nonbilious emesis, photophobia, and phonophobia which per patient is exactly like his prior migraine headaches. Vital signs are within normal limits, patient covering his eyes in bed laying quietly. He is neurologically intact otherwise. The patient has a history of TIAs and is on a blood thinner I will send him for CT head to rule out a bleed. We'll give IV Reglan, by mouth Tylenol, IV Benadryl and fluids for migraine cocktail and reassess. No evidence of SAH with no thunderclap headache, no meningeal signs, neurologically intact patient.   ----------------------------------------- 11:30 PM on 06/17/2016 -----------------------------------------   OBSERVATION CARE: This patient is being placed under observation care for the following reasons: Headache patients requiring repeat treatments and serial exams to determine if they  improve with treatment   _________________________ 2:38 PM on 06/17/2016 -----------------------------------------  Patient's labs and head CT with no acute findings. Patient reports HA has resolved. Patient very drowsy and sleepy after IV benadryl. Will monitor until patient is awake.   ----------------------------------------- 3:54 PM on 06/17/2016 -----------------------------------------   END OF OBSERVATION STATUS: After an appropriate period of observation, this patient is being  discharged due to the following reason(s):  Patient is now fully awake. HA resolved. Remains neuro intact. Will dc home with close f/u with PCP.    Pertinent labs & imaging results that were available during my care of the patient were reviewed by me and considered in my medical decision making (see chart for details).    ____________________________________________   FINAL CLINICAL IMPRESSION(S) / ED DIAGNOSES  Final diagnoses:  Other migraine with status migrainosus, not intractable      NEW MEDICATIONS STARTED DURING THIS VISIT:  New Prescriptions   METOCLOPRAMIDE (REGLAN) 10 MG TABLET    Take 1 tablet (10 mg total) by mouth every 8 (eight) hours as needed for nausea.     Note:  This document was prepared using Dragon voice recognition software and may include unintentional dictation errors.    Rudene Re, MD 06/17/16 [phone]

## 2016-06-17 NOTE — ED Triage Notes (Signed)
Patient presents to ED via [facility] from home with c/o headache and emesis. Patient reports 5 episodes of emesis. Hx of migraines and TIA's. Patient states, "My wife gave me something for nausea and im really tired". Resting with eyes closed, even and non labored respirations noted. Patient requires multiple verbal cues to arouse.

## 2016-06-17 NOTE — ED Triage Notes (Addendum)
FIRST NURSE NOTE-pt from EMS for headache and vomiting. Hx TIA. Started at [place] AM. Hx Migraines as well.  Wife gave medicine at home that made patient drowsy. EMS stroke screen negative.

## 2016-06-21 DIAGNOSIS — G43811 Other migraine, intractable, with status migrainosus: Secondary | ICD-10-CM | POA: Diagnosis not present

## 2016-08-11 DIAGNOSIS — D329 Benign neoplasm of meninges, unspecified: Secondary | ICD-10-CM | POA: Insufficient documentation

## 2016-08-11 DIAGNOSIS — G43109 Migraine with aura, not intractable, without status migrainosus: Secondary | ICD-10-CM | POA: Diagnosis not present

## 2016-08-17 DIAGNOSIS — B354 Tinea corporis: Secondary | ICD-10-CM | POA: Diagnosis not present

## 2016-08-17 DIAGNOSIS — B359 Dermatophytosis, unspecified: Secondary | ICD-10-CM | POA: Diagnosis not present

## 2016-09-21 DIAGNOSIS — L608 Other nail disorders: Secondary | ICD-10-CM | POA: Diagnosis not present

## 2016-09-21 DIAGNOSIS — Z79899 Other long term (current) drug therapy: Secondary | ICD-10-CM | POA: Diagnosis not present

## 2016-09-21 DIAGNOSIS — B354 Tinea corporis: Secondary | ICD-10-CM | POA: Diagnosis not present

## 2016-09-21 DIAGNOSIS — B359 Dermatophytosis, unspecified: Secondary | ICD-10-CM | POA: Diagnosis not present

## 2016-09-27 DIAGNOSIS — E7800 Pure hypercholesterolemia, unspecified: Secondary | ICD-10-CM | POA: Diagnosis not present

## 2016-09-27 DIAGNOSIS — I10 Essential (primary) hypertension: Secondary | ICD-10-CM | POA: Diagnosis not present

## 2016-09-27 DIAGNOSIS — Z96652 Presence of left artificial knee joint: Secondary | ICD-10-CM | POA: Diagnosis not present

## 2016-09-27 DIAGNOSIS — Z96651 Presence of right artificial knee joint: Secondary | ICD-10-CM | POA: Diagnosis not present

## 2016-09-27 DIAGNOSIS — E1143 Type 2 diabetes mellitus with diabetic autonomic (poly)neuropathy: Secondary | ICD-10-CM | POA: Diagnosis not present

## 2016-10-03 DIAGNOSIS — E7800 Pure hypercholesterolemia, unspecified: Secondary | ICD-10-CM | POA: Diagnosis not present

## 2016-10-03 DIAGNOSIS — E1143 Type 2 diabetes mellitus with diabetic autonomic (poly)neuropathy: Secondary | ICD-10-CM | POA: Diagnosis not present

## 2016-10-03 DIAGNOSIS — I10 Essential (primary) hypertension: Secondary | ICD-10-CM | POA: Diagnosis not present

## 2016-10-03 DIAGNOSIS — E6609 Other obesity due to excess calories: Secondary | ICD-10-CM | POA: Diagnosis not present

## 2016-10-03 DIAGNOSIS — Z6832 Body mass index (BMI) 32.0-32.9, adult: Secondary | ICD-10-CM | POA: Diagnosis not present

## 2016-10-14 NOTE — Telephone Encounter (Signed)
error 

## 2016-11-01 DIAGNOSIS — N138 Other obstructive and reflux uropathy: Secondary | ICD-10-CM | POA: Diagnosis not present

## 2016-11-01 DIAGNOSIS — D239 Other benign neoplasm of skin, unspecified: Secondary | ICD-10-CM | POA: Diagnosis not present

## 2016-11-01 DIAGNOSIS — R339 Retention of urine, unspecified: Secondary | ICD-10-CM | POA: Diagnosis not present

## 2016-11-01 DIAGNOSIS — N403 Nodular prostate with lower urinary tract symptoms: Secondary | ICD-10-CM | POA: Diagnosis not present

## 2016-11-01 DIAGNOSIS — R972 Elevated prostate specific antigen [PSA]: Secondary | ICD-10-CM | POA: Diagnosis not present

## 2016-11-23 ENCOUNTER — Ambulatory Visit: Payer: PPO

## 2016-11-23 ENCOUNTER — Encounter: Payer: Self-pay | Admitting: Podiatry

## 2016-11-23 ENCOUNTER — Ambulatory Visit (INDEPENDENT_AMBULATORY_CARE_PROVIDER_SITE_OTHER): Payer: PPO | Admitting: Podiatry

## 2016-11-23 ENCOUNTER — Ambulatory Visit (INDEPENDENT_AMBULATORY_CARE_PROVIDER_SITE_OTHER): Payer: PPO

## 2016-11-23 VITALS — BP 131/69 | HR 61 | Temp 98.7°F

## 2016-11-23 DIAGNOSIS — Z6832 Body mass index (BMI) 32.0-32.9, adult: Secondary | ICD-10-CM

## 2016-11-23 DIAGNOSIS — E119 Type 2 diabetes mellitus without complications: Secondary | ICD-10-CM | POA: Diagnosis not present

## 2016-11-23 DIAGNOSIS — M722 Plantar fascial fibromatosis: Secondary | ICD-10-CM | POA: Diagnosis not present

## 2016-11-23 DIAGNOSIS — E6609 Other obesity due to excess calories: Secondary | ICD-10-CM | POA: Insufficient documentation

## 2016-11-23 DIAGNOSIS — E7800 Pure hypercholesterolemia, unspecified: Secondary | ICD-10-CM | POA: Insufficient documentation

## 2016-11-23 NOTE — Patient Instructions (Signed)

## 2016-11-23 NOTE — Progress Notes (Signed)
[name] presents today chief complaint of pain times a past 2 weeks to his right heel. He states that he has diabetes with some neuropathy and he's concerned about the pain in the arch with pins and needles. He states that his blood sugars under good control at this point.  Objective: Vital signs are stable he is alert and oriented 3. Pulses are palpable with slight decrease in sensorium per Semmes-Weinstein monofilament. Deep tendon reflexes are intact and muscle strength is normal symmetrical bilateral. Orthopedic evaluation does demonstrate an antalgic gait with pain on palpation medial calcaneal tubercle of the right heel. Radiographs taken today demonstrate significant osteoarthritis from the metatarsophalangeal joints and midfoot as well as the rear foot. He also has a soft tissue increase in density of the plantar fascia calcaneal insertion site of the right heel.  Assessment: Plantar fasciitis diabetic peripheral neuropathy area  Plan: I injected the right heel today with Kenalog and local anesthetic placed in a plantar fascial night splint. We discussed appropriate shoe gear stretching exercises ice therapy issue modifications area follow-up with him in 1 month if necessary.

## 2016-11-30 DIAGNOSIS — E119 Type 2 diabetes mellitus without complications: Secondary | ICD-10-CM | POA: Diagnosis not present

## 2016-12-01 ENCOUNTER — Other Ambulatory Visit: Payer: Self-pay | Admitting: Cardiovascular Disease

## 2016-12-05 NOTE — Progress Notes (Signed)
Cardiology Office Note  Date:  12/06/2016   ID:  Kevin Choi, DOB [birth year], MRN [id number]  PCP:  [name], MD   Chief Complaint  Patient presents with  . other    6 month follow up. Patient c/o SOB with excertion. Patient denies cehst pain. Meds reviewed verbally with patient.     HPI:  76 year old gentleman with a history of  coronary artery disease,  bypass surgery with a LIMA to the LAD in 2002,  vein graft to the diagonal,  diabetes,  hypertension,  hyperlipidemia,  pleural effusion requiring a VATS,  carotid endarterectomy on the right in 2001,  TIA symptoms 3 times, once in 2002, October 2012 in March 2013. Status post bilateral total knee replacements 40% left carotid arterial disease who presents for routine followup of his coronary artery disease .  In follow-up today he reports that he is doing relatively well  mild shortness of breath, bending to tie shoes Sedentary, deconditioned,  Stopped regular exercise 3 days per week,  denies any symptoms of chest pain   stopped Lasix 3 days per week, feels he is on too many medications Takes hydralazine 100 3 times a day With his ACE inhibitor, beta blocker  Lab work reviewed with him HBA1C 6.4 Total chol 110, LDL 52 Topped his cholesterol for several months while on antifungal medication  EKG personally reviewed by myself on todays visit shows normal sinus rhythm with rate 62 bpm,  left bundle branch block, PVC  Other past medical history  May 14 2011  he had acute onset of slurred speech. He was playing cards shortly after and he was unable to do any math. Symptoms lasted for approximately one to 2 hours. Blood pressure prior to this event had been well controlled though during and after the event, he had severe hypertension with systolic pressures sometimes greater than 200. He was in the emergency room for 2 days with extensive testing.  carotid ultrasound that showed no hemodynamically  significant stenosis Head CT scan showing no acute stroke MRI of the head showing no infarction. Stable appearing area of enhancement of the right temporal lobe most compatible with meningioma Chest x-ray was essentially normal  Stress test April 2009 shows no significant ischemia. This was a pharmacologic study.   PMH:   has a past medical history of Allergic rhinitis due to pollen; CAD (coronary artery disease) (2002); Chicken pox; Diabetes ([place]); Diabetes mellitus; Elevated prostate specific antigen (PSA) (02/21/2001); Headache(784.0); Hemiplegic migraine, without mention of intractable migraine without mention of status migrainosus; HLD (hyperlipidemia); HTN (hypertension) (05/08/2007); Hyperhidrosis; Impotence of organic origin (07/24/2007); Leg pain; Measles; Mumps; Nausea with vomiting; Neuropathy; Occlusion and stenosis of carotid artery without mention of cerebral infarction (09/15/2006); Peripheral neuropathy (01/31/2008); Peripheral vascular disease ([place]); Personal history of urinary calculi; Pleural effusion (2007); Restless legs syndrome (RLS) (01/10/2007); Slurred speech; and TIA (transient ischemic attack).  PSH:    Past Surgical History:  Procedure Laterality Date  . CAROTID ENDARTERECTOMY  2001   right  . CHOLECYSTECTOMY  2003  . collapse lung  2007  . CORONARY ARTERY BYPASS GRAFT  2001  . KNEE SURGERY Left 1981  . LUNG SURGERY  2007   Left; decortication left lung for persistant pleural effusion and PTX. admission ten days at [facility].  [name] PAROTID GLAND TUMOR EXCISION  1973  . VASECTOMY      Current Outpatient Prescriptions  Medication Sig Dispense Refill  . atorvastatin (LIPITOR) 40 MG tablet Take 40 mg by mouth  daily.    . cetirizine (ZYRTEC) 10 MG tablet Take 10 mg by mouth as needed.     . diphenoxylate-atropine (LOMOTIL) 2.5-0.025 MG per tablet Take 1 tablet by mouth 4 (four) times daily as needed for diarrhea or loose stools. 30 tablet 0  . finasteride (PROSCAR) 5  MG tablet Take 1 tablet (5 mg total) by mouth daily. 90 tablet 3  . furosemide (LASIX) 20 MG tablet Take 1 tablet (20 mg total) by mouth 2 (two) times daily as needed. 180 tablet 3  . gabapentin (NEURONTIN) 300 MG capsule Take 300 mg by mouth 3 (three) times daily.    [name] glucose blood test strip Accuchek compact plus. Dx E11.9 -Check sugars twice a day 100 each 3  . hydrALAZINE (APRESOLINE) 100 MG tablet Take 100 mg by mouth 2 (two) times daily.    [name] ketoconazole (NIZORAL) 2 % cream     . magnesium gluconate (MAGONATE) 500 MG tablet Take 500 mg by mouth daily.    . metFORMIN (GLUCOPHAGE) 500 MG tablet Take 500 mg by mouth 2 (two) times daily with a meal.    . metoprolol succinate (TOPROL-XL) 50 MG 24 hr tablet Take 50 mg by mouth daily. Take with or immediately following a meal.    . Multiple Vitamins-Minerals (MULTIVITAL) tablet Take 1 tablet by mouth daily.      . nitroGLYCERIN (NITROSTAT) 0.4 MG SL tablet Place 0.4 mg under the tongue every 5 (five) minutes as needed.      . Omega-3 Fatty Acids (FISH OIL) 1000 MG CAPS Take by mouth 2 (two) times daily.      . pramipexole (MIRAPEX) 0.25 MG tablet Take 1 tablet (0.25 mg total) by mouth 3 (three) times daily. 270 tablet 3  . promethazine (PHENERGAN) 12.5 MG tablet Take 1 tablet (12.5 mg total) by mouth every 8 (eight) hours as needed for nausea. 20 tablet 0  . quinapril (ACCUPRIL) 20 MG tablet TAKE 1 & 1/2 TABLETS BY MOUTH ONCE DAILY 135 tablet 3  . Rivaroxaban (XARELTO) 20 MG TABS tablet Take 1 tablet (20 mg total) by mouth daily. 90 tablet 3  . metoCLOPramide (REGLAN) 10 MG tablet Take 1 tablet (10 mg total) by mouth every 8 (eight) hours as needed for nausea. 20 tablet 0   No current facility-administered medications for this visit.      Allergies:   Iodinated diagnostic agents and Other   Social History:  The patient  reports that he has quit smoking. His smoking use included Cigarettes. He has a 20.00 pack-year smoking history. He has  never used smokeless tobacco. He reports that he does not drink alcohol or use drugs.   Family History:   family history includes Angina in his father; Anxiety disorder in his brother; Cancer in his father and other; Colon polyps in his sister; Coronary artery disease in his other; Emphysema in his father; Heart attack in his brother and mother; Heart disease in his maternal aunt; Heart failure in his father; Lymphoma in his sister.    Review of Systems: Review of Systems  Constitutional: Positive for malaise/fatigue.  Respiratory: Positive for shortness of breath.   Cardiovascular: Negative.   Gastrointestinal: Negative.   Musculoskeletal: Negative.   Neurological: Negative.   Psychiatric/Behavioral: Negative.   All other systems reviewed and are negative.    PHYSICAL EXAM: VS:  BP (!) 142/60 (BP Location: Left Arm, Patient Position: Sitting, Cuff Size: Normal)   Pulse 62   Ht 5' 8.5" (1.74 m)  Wt 214 lb (97.1 kg)   BMI 32.07 kg/m  , BMI Body mass index is 32.07 kg/m. GEN: Well nourished, well developed, in no acute distress  HEENT: normal  Neck: no JVD, carotid bruits, or masses Cardiac: RRR; 2/6 SEM LSB,  No rubs, or gallops,trace LE  edema  Respiratory:  clear to auscultation bilaterally, normal work of breathing GI: soft, nontender, nondistended, + BS MS: no deformity or atrophy  Skin: warm and dry, no rash Neuro:  Strength and sensation are intact Psych: euthymic mood, full affect    Recent Labs: 06/17/2016: ALT 20; BUN 18; Creatinine, Ser 0.89; Hemoglobin 13.9; Platelets 205; Potassium 4.4; Sodium 136    Lipid Panel Lab Results  Component Value Date   CHOL 147 06/06/2013   HDL 37.90 (L) 06/06/2013   LDLCALC 61 06/06/2013   TRIG 243.0 (H) 06/06/2013      Wt Readings from Last 3 Encounters:  12/06/16 214 lb (97.1 kg)  06/17/16 210 lb (95.3 kg)  06/02/16 212 lb 8 oz (96.4 kg)       ASSESSMENT AND PLAN:  Essential hypertension - Plan: EKG  12-Lead Blood pressure is well controlled on today's visit. No changes made to the medications.  discussed new Guidelines systolic pressure 361 or less He does not want more medications at this time  Atherosclerosis of native coronary artery with angina pectoris, unspecified whether native or transplanted heart (HCC) -  left bundle branch block, some shortness of breath  lexiscan Myoview showed no ischemia ejection fraction mildly depressed He is declining echocardiogram at this time  Diastolic CHF, chronic ([place]) - Plan: EKG 12-Lead Takes Lasix 3 days per week only as needed Trace leg edema, recommended he take Lasix for any leg swelling or shortness of breath  Carotid disease 40% disease on the left in 2015  Hyperlipidemia Cholesterol is at goal on the current lipid regimen. No changes to the medications were made.   Total encounter time more than 25 minutes  Greater than 50% was spent in counseling and coordination of care with the patient   Disposition:   F/U  12 months   Orders Placed This Encounter  Procedures  . EKG 12-Lead     Signed, Esmond Plants, M.D., Ph.D. 12/06/2016  [place], [place]

## 2016-12-06 ENCOUNTER — Encounter: Payer: Self-pay | Admitting: Cardiovascular Disease

## 2016-12-06 ENCOUNTER — Ambulatory Visit (INDEPENDENT_AMBULATORY_CARE_PROVIDER_SITE_OTHER): Payer: PPO | Admitting: Cardiovascular Disease

## 2016-12-06 VITALS — BP 142/60 | HR 62 | Ht 68.5 in | Wt 214.0 lb

## 2016-12-06 DIAGNOSIS — E782 Mixed hyperlipidemia: Secondary | ICD-10-CM

## 2016-12-06 DIAGNOSIS — E1143 Type 2 diabetes mellitus with diabetic autonomic (poly)neuropathy: Secondary | ICD-10-CM

## 2016-12-06 DIAGNOSIS — I6523 Occlusion and stenosis of bilateral carotid arteries: Secondary | ICD-10-CM

## 2016-12-06 DIAGNOSIS — I5032 Chronic diastolic (congestive) heart failure: Secondary | ICD-10-CM | POA: Diagnosis not present

## 2016-12-06 DIAGNOSIS — I25118 Atherosclerotic heart disease of native coronary artery with other forms of angina pectoris: Secondary | ICD-10-CM

## 2016-12-06 DIAGNOSIS — E1142 Type 2 diabetes mellitus with diabetic polyneuropathy: Secondary | ICD-10-CM | POA: Diagnosis not present

## 2016-12-06 DIAGNOSIS — I10 Essential (primary) hypertension: Secondary | ICD-10-CM

## 2016-12-06 NOTE — Patient Instructions (Addendum)

## 2016-12-21 ENCOUNTER — Ambulatory Visit (INDEPENDENT_AMBULATORY_CARE_PROVIDER_SITE_OTHER): Payer: PPO | Admitting: Podiatry

## 2016-12-21 ENCOUNTER — Encounter: Payer: Self-pay | Admitting: Podiatry

## 2016-12-21 DIAGNOSIS — M722 Plantar fascial fibromatosis: Secondary | ICD-10-CM | POA: Diagnosis not present

## 2016-12-24 NOTE — Progress Notes (Signed)
He presents today for follow-up of his plantar fasciitis to his right heel. States that it's sore but is a lot better than it was.  Objective: Vital signs are stable alert and oriented 3. Pulses are palpable. His pain on palpation medially occasional the right heel.  Assessment: Resolving plantar fasciitis right.  Plan: Recommended that he continue to perform all of his conservative therapies injected his right heel. Follow-up with me in 1 month if necessary.

## 2017-01-18 ENCOUNTER — Ambulatory Visit: Payer: PPO | Admitting: Podiatry

## 2017-01-20 DIAGNOSIS — N390 Urinary tract infection, site not specified: Secondary | ICD-10-CM | POA: Diagnosis not present

## 2017-01-20 DIAGNOSIS — R319 Hematuria, unspecified: Secondary | ICD-10-CM | POA: Diagnosis not present

## 2017-02-01 DIAGNOSIS — N3001 Acute cystitis with hematuria: Secondary | ICD-10-CM | POA: Diagnosis not present

## 2017-02-01 DIAGNOSIS — N3000 Acute cystitis without hematuria: Secondary | ICD-10-CM | POA: Insufficient documentation

## 2017-02-01 DIAGNOSIS — N403 Nodular prostate with lower urinary tract symptoms: Secondary | ICD-10-CM | POA: Diagnosis not present

## 2017-02-01 DIAGNOSIS — R300 Dysuria: Secondary | ICD-10-CM | POA: Diagnosis not present

## 2017-02-01 DIAGNOSIS — R339 Retention of urine, unspecified: Secondary | ICD-10-CM | POA: Diagnosis not present

## 2017-02-01 DIAGNOSIS — N138 Other obstructive and reflux uropathy: Secondary | ICD-10-CM | POA: Diagnosis not present

## 2017-02-23 ENCOUNTER — Other Ambulatory Visit: Payer: Self-pay | Admitting: Otolaryngology

## 2017-02-23 DIAGNOSIS — H90A22 Sensorineural hearing loss, unilateral, left ear, with restricted hearing on the contralateral side: Secondary | ICD-10-CM | POA: Diagnosis not present

## 2017-02-23 DIAGNOSIS — IMO0001 Reserved for inherently not codable concepts without codable children: Secondary | ICD-10-CM

## 2017-02-23 DIAGNOSIS — H9319 Tinnitus, unspecified ear: Secondary | ICD-10-CM | POA: Diagnosis not present

## 2017-02-23 DIAGNOSIS — H9042 Sensorineural hearing loss, unilateral, left ear, with unrestricted hearing on the contralateral side: Secondary | ICD-10-CM

## 2017-02-23 DIAGNOSIS — H903 Sensorineural hearing loss, bilateral: Secondary | ICD-10-CM | POA: Diagnosis not present

## 2017-03-09 ENCOUNTER — Ambulatory Visit
Admission: RE | Admit: 2017-03-09 | Discharge: 2017-03-09 | Disposition: A | Discharge status: Home / Self Care | Payer: PPO | Source: Ambulatory Visit | Attending: Otolaryngology | Admitting: Otolaryngology

## 2017-03-09 DIAGNOSIS — G9389 Other specified disorders of brain: Secondary | ICD-10-CM | POA: Diagnosis not present

## 2017-03-09 DIAGNOSIS — H9042 Sensorineural hearing loss, unilateral, left ear, with unrestricted hearing on the contralateral side: Secondary | ICD-10-CM | POA: Diagnosis not present

## 2017-03-09 DIAGNOSIS — IMO0001 Reserved for inherently not codable concepts without codable children: Secondary | ICD-10-CM

## 2017-03-09 DIAGNOSIS — H903 Sensorineural hearing loss, bilateral: Secondary | ICD-10-CM | POA: Diagnosis not present

## 2017-03-09 LAB — POCT I-STAT CREATININE: Creatinine, Ser: 1 mg/dL (ref 0.61–1.24)

## 2017-03-09 MED ORDER — GADOBENATE DIMEGLUMINE 529 MG/ML IV SOLN
20.0000 mL | Freq: Once | INTRAVENOUS | Status: AC | PRN
  Administered 2017-03-09: 20 mL via INTRAVENOUS

## 2017-03-28 DIAGNOSIS — D429 Neoplasm of uncertain behavior of meninges, unspecified: Secondary | ICD-10-CM | POA: Diagnosis not present

## 2017-03-30 DIAGNOSIS — E1143 Type 2 diabetes mellitus with diabetic autonomic (poly)neuropathy: Secondary | ICD-10-CM | POA: Diagnosis not present

## 2017-03-30 DIAGNOSIS — E7800 Pure hypercholesterolemia, unspecified: Secondary | ICD-10-CM | POA: Diagnosis not present

## 2017-03-30 DIAGNOSIS — I10 Essential (primary) hypertension: Secondary | ICD-10-CM | POA: Diagnosis not present

## 2017-04-04 DIAGNOSIS — D329 Benign neoplasm of meninges, unspecified: Secondary | ICD-10-CM | POA: Diagnosis not present

## 2017-04-06 ENCOUNTER — Telehealth: Payer: Self-pay | Admitting: Cardiovascular Disease

## 2017-04-06 DIAGNOSIS — D329 Benign neoplasm of meninges, unspecified: Secondary | ICD-10-CM | POA: Diagnosis not present

## 2017-04-06 DIAGNOSIS — E6609 Other obesity due to excess calories: Secondary | ICD-10-CM | POA: Diagnosis not present

## 2017-04-06 DIAGNOSIS — I10 Essential (primary) hypertension: Secondary | ICD-10-CM | POA: Diagnosis not present

## 2017-04-06 DIAGNOSIS — Z6832 Body mass index (BMI) 32.0-32.9, adult: Secondary | ICD-10-CM | POA: Diagnosis not present

## 2017-04-06 DIAGNOSIS — Z0000 Encounter for general adult medical examination without abnormal findings: Secondary | ICD-10-CM | POA: Diagnosis not present

## 2017-04-06 DIAGNOSIS — E7800 Pure hypercholesterolemia, unspecified: Secondary | ICD-10-CM | POA: Diagnosis not present

## 2017-04-06 DIAGNOSIS — Z8673 Personal history of transient ischemic attack (TIA), and cerebral infarction without residual deficits: Secondary | ICD-10-CM | POA: Insufficient documentation

## 2017-04-06 DIAGNOSIS — E1143 Type 2 diabetes mellitus with diabetic autonomic (poly)neuropathy: Secondary | ICD-10-CM | POA: Diagnosis not present

## 2017-04-06 NOTE — Telephone Encounter (Signed)
° °  [facility] Pre-operative Risk Assessment    Request for surgical clearance:  1. What type of surgery is being performed?  Brain Tumor Removal   2. When is this surgery scheduled? 2/28 screening and procedure 3/1   3. What type of clearance is required (medical clearance vs. Pharmacy clearance to hold med vs. Both)? both  4. Are there any medications that need to be held prior to surgery and how long? Please advise   5. Practice name and name of physician performing surgery? [facility]   6. What is your office phone and fax number? tel [phone] fax: [phone]  7. Anesthesia type (None, local, MAC, general) ? Unknown    Clarisse Gouge 04/06/2017, 10:54 AM  _________________________________________________________________   (provider comments below)

## 2017-04-06 NOTE — Telephone Encounter (Signed)
No answer. Left message to call back.   

## 2017-04-06 NOTE — Telephone Encounter (Signed)
Pt returning our call °Please call back ° °

## 2017-04-06 NOTE — Telephone Encounter (Signed)
S/w patient no changes since last appointment with Dr Rockey Situ on 12/06/2017. Shortness of breath when walking up stairs but reports he was like that at the last appointment and it is noted in Dr Donivan Scull note.  Patient mentioned that Dr Rockey Situ said he may need an echo at last office visit as well and wondered if he still needed that. Advised I will route to Dr Rockey Situ to address question and clearance.

## 2017-04-06 NOTE — Telephone Encounter (Signed)
Patient has already received copies of OV visit noted, EKG and myoview to take to preoperative appointment.  Left message for patient to call back and assess symptoms.

## 2017-04-06 NOTE — Telephone Encounter (Signed)
Patient dropped off check list for pre op screening and picked up medical records   Please call patient when clearance is done

## 2017-04-09 NOTE — Telephone Encounter (Signed)
Acceptable risk for procedure We will need guidance on anticoag from neurosurg Ok to stop xarelto prior to the procedure probably should not be on fish oil, That is a blood thinner Need to find out if he can be on asa 81 when off the xarelto Would restart anticoagulation only when cleared by surgery

## 2017-04-10 NOTE — Telephone Encounter (Signed)
No answer. Left message to call back with patient.   

## 2017-04-10 NOTE — Telephone Encounter (Signed)
S/w patient. He verbalized understanding

## 2017-04-10 NOTE — Telephone Encounter (Signed)
Left message with Neurosurgeon's office to call back to discuss aspirin.

## 2017-04-10 NOTE — Telephone Encounter (Signed)
Left message to call back to Dr Shanda Bumps office.   Telephone encounter also routed to Dr via Sallee Provencal fax.

## 2017-04-11 NOTE — Telephone Encounter (Signed)
S/w Dr Shanda Bumps office. They did not receive fax yesterday. I relayed Dr Donivan Scull message. She asked me to refax and she will give it to the nurse.   Telephone encounter clearance refaxed and fax number was verified.

## 2017-04-11 NOTE — Telephone Encounter (Signed)
Spoke with Revi at physicians office and she confirmed fax was received and placed on nurses desk for review. Advised to call back if they should have any further questions.

## 2017-04-12 NOTE — Telephone Encounter (Signed)
Received incoming call from Jeneen Rinks at Dr Renold Genta office.  Dr Luiz Ochoa responded: "Would really like him off anticoagulants for the peri-operative period."  Routing to Dr Rockey Situ for review.

## 2017-04-13 NOTE — Telephone Encounter (Signed)
Encounter notes routed to number listed.

## 2017-04-13 NOTE — Telephone Encounter (Signed)
Certainly can come off Xarelto  2 days for the procedure, 3 days if they need Ideally would like to keep on aspirin 81 mg daily given history of TIAs, carotid disease, coronary disease with bypass grafts If they require him stopping aspirin,  They can stop aspirin if needed, but does increase risk thx TGollan

## 2017-04-20 DIAGNOSIS — I10 Essential (primary) hypertension: Secondary | ICD-10-CM | POA: Diagnosis not present

## 2017-04-20 DIAGNOSIS — D320 Benign neoplasm of cerebral meninges: Secondary | ICD-10-CM | POA: Diagnosis not present

## 2017-04-20 DIAGNOSIS — Z8673 Personal history of transient ischemic attack (TIA), and cerebral infarction without residual deficits: Secondary | ICD-10-CM | POA: Diagnosis not present

## 2017-04-20 DIAGNOSIS — E6609 Other obesity due to excess calories: Secondary | ICD-10-CM | POA: Diagnosis not present

## 2017-04-20 DIAGNOSIS — Z87891 Personal history of nicotine dependence: Secondary | ICD-10-CM | POA: Diagnosis not present

## 2017-04-20 DIAGNOSIS — R410 Disorientation, unspecified: Secondary | ICD-10-CM | POA: Diagnosis not present

## 2017-04-20 DIAGNOSIS — Z6832 Body mass index (BMI) 32.0-32.9, adult: Secondary | ICD-10-CM | POA: Diagnosis not present

## 2017-04-20 DIAGNOSIS — Z7901 Long term (current) use of anticoagulants: Secondary | ICD-10-CM | POA: Diagnosis not present

## 2017-04-20 DIAGNOSIS — E119 Type 2 diabetes mellitus without complications: Secondary | ICD-10-CM | POA: Diagnosis not present

## 2017-04-20 DIAGNOSIS — R110 Nausea: Secondary | ICD-10-CM | POA: Diagnosis not present

## 2017-04-20 DIAGNOSIS — E1143 Type 2 diabetes mellitus with diabetic autonomic (poly)neuropathy: Secondary | ICD-10-CM | POA: Diagnosis not present

## 2017-04-20 DIAGNOSIS — Z7984 Long term (current) use of oral hypoglycemic drugs: Secondary | ICD-10-CM | POA: Diagnosis not present

## 2017-04-20 DIAGNOSIS — I2510 Atherosclerotic heart disease of native coronary artery without angina pectoris: Secondary | ICD-10-CM | POA: Diagnosis not present

## 2017-04-20 DIAGNOSIS — E668 Other obesity: Secondary | ICD-10-CM | POA: Diagnosis not present

## 2017-04-20 DIAGNOSIS — D329 Benign neoplasm of meninges, unspecified: Secondary | ICD-10-CM | POA: Diagnosis not present

## 2017-04-20 DIAGNOSIS — Z86011 Personal history of benign neoplasm of the brain: Secondary | ICD-10-CM | POA: Diagnosis not present

## 2017-04-20 DIAGNOSIS — E7800 Pure hypercholesterolemia, unspecified: Secondary | ICD-10-CM | POA: Diagnosis not present

## 2017-04-20 DIAGNOSIS — Z01818 Encounter for other preprocedural examination: Secondary | ICD-10-CM | POA: Diagnosis not present

## 2017-04-20 DIAGNOSIS — E785 Hyperlipidemia, unspecified: Secondary | ICD-10-CM | POA: Diagnosis not present

## 2017-04-20 DIAGNOSIS — Z951 Presence of aortocoronary bypass graft: Secondary | ICD-10-CM | POA: Diagnosis not present

## 2017-04-20 DIAGNOSIS — G5600 Carpal tunnel syndrome, unspecified upper limb: Secondary | ICD-10-CM | POA: Diagnosis not present

## 2017-04-20 DIAGNOSIS — N400 Enlarged prostate without lower urinary tract symptoms: Secondary | ICD-10-CM | POA: Diagnosis not present

## 2017-04-20 DIAGNOSIS — Z9861 Coronary angioplasty status: Secondary | ICD-10-CM | POA: Diagnosis not present

## 2017-04-20 DIAGNOSIS — Z79899 Other long term (current) drug therapy: Secondary | ICD-10-CM | POA: Diagnosis not present

## 2017-04-20 DIAGNOSIS — T380X5A Adverse effect of glucocorticoids and synthetic analogues, initial encounter: Secondary | ICD-10-CM | POA: Diagnosis not present

## 2017-04-20 DIAGNOSIS — Z96653 Presence of artificial knee joint, bilateral: Secondary | ICD-10-CM | POA: Diagnosis not present

## 2017-04-27 ENCOUNTER — Other Ambulatory Visit: Payer: Self-pay

## 2017-04-27 NOTE — Patient Outreach (Signed)
[place] [facility]) Care Management  04/27/2017  Kevin Choi 1940/07/16 [id number]  Transition of care  Referral date: 04/27/17 Referral source: discharged from [facility] on 04/23/17 Insurance: health team advantage.   Telephone call to patient for transition of care follow up. HIPAA verified with patient. Discussed transition of care program with patient.  Patient reports he was in the hospital due to having a meningioma removed from his brain. Patient states he is doing well. Patient denies any pain or new symptoms. Patient reports he has a follow up appointment with his surgeron, Dr. Yetta Glassman on 05/02/17 and another follow up 6 weeks after. Patient states he does not have a follow up scheduled with his primary MD until August 2019. RNCM advised patient to call his primary MD office and notify them of his discharged. Explained to patient his primary MD office may want to see him for a post hospital discharge  Patient verbalized understanding. RNCM states his wife is cleaning his wound 2 times per day. Patient states he has stitches.  RNCM discussed signs/ symptoms of infection. Patient denies any symptoms.   Patient states his wife is providing transportation for him. Patient states he is able to do his on ADL's.   Patient reports he has been able to take a walk outside. Patient voices being very pleased with how he is doing at this time. Patient refuses ongoing transition of care calls at this time. Patient states he does not feel like he needs the calls because he is doing so well.  RNCM advised patient to notify MD of any changes in condition prior to scheduled appointment. RNCM provided contact name and number: [phone] or main office number [phone] and 24 hour nurse advise line [phone].  RNCM verified patient aware of 911 services for urgent/ emergent needs.   PLAN: RNCM will refer patient to care management assistant to close patient due to  patient refusing services.  RNCM will send patient [facility] care management brochure and magnet. RNCM will notify patients primary MD of closure.   Quinn Plowman RN,BSN,CCM [facility] Telephonic  [phone]

## 2017-05-02 DIAGNOSIS — Z4802 Encounter for removal of sutures: Secondary | ICD-10-CM | POA: Diagnosis not present

## 2017-05-03 DIAGNOSIS — Z09 Encounter for follow-up examination after completed treatment for conditions other than malignant neoplasm: Secondary | ICD-10-CM | POA: Diagnosis not present

## 2017-05-15 ENCOUNTER — Other Ambulatory Visit: Payer: Self-pay | Admitting: Physician Assistant

## 2017-05-15 DIAGNOSIS — D329 Benign neoplasm of meninges, unspecified: Secondary | ICD-10-CM

## 2017-06-05 ENCOUNTER — Ambulatory Visit
Admission: RE | Admit: 2017-06-05 | Discharge: 2017-06-05 | Disposition: A | Discharge status: Home / Self Care | Payer: PPO | Source: Ambulatory Visit | Attending: Physician Assistant | Admitting: Physician Assistant

## 2017-06-05 DIAGNOSIS — G9389 Other specified disorders of brain: Secondary | ICD-10-CM | POA: Diagnosis not present

## 2017-06-05 DIAGNOSIS — D329 Benign neoplasm of meninges, unspecified: Secondary | ICD-10-CM | POA: Diagnosis not present

## 2017-06-05 DIAGNOSIS — Z9889 Other specified postprocedural states: Secondary | ICD-10-CM | POA: Insufficient documentation

## 2017-06-05 DIAGNOSIS — D320 Benign neoplasm of cerebral meninges: Secondary | ICD-10-CM | POA: Diagnosis not present

## 2017-06-05 MED ORDER — GADOBENATE DIMEGLUMINE 529 MG/ML IV SOLN
20.0000 mL | Freq: Once | INTRAVENOUS | Status: AC | PRN
  Administered 2017-06-05: 20 mL via INTRAVENOUS

## 2017-06-13 DIAGNOSIS — D329 Benign neoplasm of meninges, unspecified: Secondary | ICD-10-CM | POA: Diagnosis not present

## 2017-06-20 ENCOUNTER — Ambulatory Visit (INDEPENDENT_AMBULATORY_CARE_PROVIDER_SITE_OTHER): Payer: PPO | Admitting: Cardiovascular Disease

## 2017-06-20 ENCOUNTER — Encounter: Payer: Self-pay | Admitting: Cardiovascular Disease

## 2017-06-20 VITALS — BP 152/64 | HR 77 | Ht 68.5 in | Wt 218.5 lb

## 2017-06-20 DIAGNOSIS — I25118 Atherosclerotic heart disease of native coronary artery with other forms of angina pectoris: Secondary | ICD-10-CM | POA: Diagnosis not present

## 2017-06-20 DIAGNOSIS — I5033 Acute on chronic diastolic (congestive) heart failure: Secondary | ICD-10-CM | POA: Diagnosis not present

## 2017-06-20 DIAGNOSIS — E7800 Pure hypercholesterolemia, unspecified: Secondary | ICD-10-CM | POA: Diagnosis not present

## 2017-06-20 DIAGNOSIS — I6523 Occlusion and stenosis of bilateral carotid arteries: Secondary | ICD-10-CM

## 2017-06-20 DIAGNOSIS — E782 Mixed hyperlipidemia: Secondary | ICD-10-CM

## 2017-06-20 DIAGNOSIS — I10 Essential (primary) hypertension: Secondary | ICD-10-CM

## 2017-06-20 DIAGNOSIS — G459 Transient cerebral ischemic attack, unspecified: Secondary | ICD-10-CM | POA: Diagnosis not present

## 2017-06-20 MED ORDER — POTASSIUM CHLORIDE ER 10 MEQ PO TBCR: 10.0000 meq | EXTENDED_RELEASE_TABLET | Freq: Two times a day (BID) | ORAL | 3 refills | Status: DC

## 2017-06-20 MED ORDER — FUROSEMIDE 20 MG PO TABS: 20.0000 mg | ORAL_TABLET | Freq: Two times a day (BID) | ORAL | 3 refills | Status: DC | PRN

## 2017-06-20 NOTE — Patient Instructions (Addendum)
Medication Instructions:   Please start lasix 20 mg twice a day for this week Take with potassium twice a day Take at 8 am and 2 pm  Once the shortness of breath and leg edema has dramatically improved, Decrease lasix down to one a day with one potassium   Watch the eating out, salt and fluid Medication Samples have been provided to the patient.  Drug name: Xarelto       Strength: 20 mg        Qty: 4 bottles  LOT: [id number]  Exp.Date: 04/21   Labwork:  BMP in 2 weeks  Testing/Procedures:  We will order an echo for shortness of breath and leg swelling, CAD   Follow-Up: It was a pleasure seeing you in the office today. Please call us if you have new issues that need to be addressed before your next appt.  [phone]  Your physician wants you to follow-up in: 1 month.    If you need a refill on your cardiac medications before your next appointment, please call your pharmacy.  For educational health videos Log in to : [email or web address] Or : [email or web address], password : [email or web address]

## 2017-06-20 NOTE — Progress Notes (Signed)
Cardiology Office Note  Date:  06/20/2017   ID:  Kevin Choi, DOB 1940/06/01, MRN [id number]  PCP:  [name], MD   Chief Complaint  Patient presents with  . other    Elevated BP, Sob and feet swelling. Meds reviewed verbally with pt.    HPI:  77 year old gentleman with a history of  coronary artery disease,  bypass surgery with a LIMA to the LAD in 2002,  vein graft to the diagonal,  diabetes,  hypertension,  hyperlipidemia,  pleural effusion requiring a VATS,  carotid endarterectomy on the right in 2001,  TIA symptoms 3 times, once in 2002, October 2012 in March 2013. Status post bilateral total knee replacements 40% left carotid arterial disease who presents for routine followup of his coronary artery disease , new leg swelling and shortness of breath.  He reports having possible hearing problem,  led to MRI of the brain Discovery of mass right side of the brain Evaluated at [facility] found to have meningioma Had resection  04/21/2017 surgery resection   Reports that he has recovered well  Since then though has had weight gain,  Shortness of breath on exertion, worsening leg swelling  Weight up 8 to 9 pounds on our scale Reports that he is eating out quite frequently during the week Does not like cooking for the 2 of them, he and his wife Taking Lasix sparingly not every day  On prior office visit reported having mild shortness of breath, bending to tie shoes Sedentary, deconditioned Stopped his exercise Denies chest pain In fact on his last office visit reported he had stopped his Lasix Felt he was on too many medications   Blood pressure has been running high at home 150 sometimes even 676 systolic over 19J to 09T  Lab work reviewed with him HBA1C 6.4 Total chol 110, LDL 52  EKG personally reviewed by myself on todays visit shows normal sinus rhythm with rate 77 bpm,  left bundle branch block  Other past medical history  May 14 2011  he  had acute onset of slurred speech. He was playing cards shortly after and he was unable to do any math. Symptoms lasted for approximately one to 2 hours. Blood pressure prior to this event had been well controlled though during and after the event, he had severe hypertension with systolic pressures sometimes greater than 200. He was in the emergency room for 2 days with extensive testing.  carotid ultrasound that showed no hemodynamically significant stenosis Head CT scan showing no acute stroke MRI of the head showing no infarction. Stable appearing area of enhancement of the right temporal lobe most compatible with meningioma Chest x-ray was essentially normal  Stress test April 2009 shows no significant ischemia. This was a pharmacologic study.   PMH:   has a past medical history of Allergic rhinitis due to pollen, CAD (coronary artery disease) (2002), Chicken pox, Diabetes ([place]), Diabetes mellitus, Elevated prostate specific antigen (PSA) (02/21/2001), Headache(784.0), Hemiplegic migraine, without mention of intractable migraine without mention of status migrainosus, HLD (hyperlipidemia), HTN (hypertension) (05/08/2007), Hyperhidrosis, Impotence of organic origin (07/24/2007), Leg pain, Measles, Mumps, Nausea with vomiting, Neuropathy, Occlusion and stenosis of carotid artery without mention of cerebral infarction (09/15/2006), Peripheral neuropathy (01/31/2008), Peripheral vascular disease ([place]), Personal history of urinary calculi, Pleural effusion (2007), Restless legs syndrome (RLS) (01/10/2007), Slurred speech, and TIA (transient ischemic attack).  PSH:    Past Surgical History:  Procedure Laterality Date  . CAROTID ENDARTERECTOMY  2001  right  . CHOLECYSTECTOMY  2003  . collapse lung  2007  . CORONARY ARTERY BYPASS GRAFT  2001  . KNEE SURGERY Left 1981  . LUNG SURGERY  2007   Left; decortication left lung for persistant pleural effusion and PTX. admission ten [facility] at [facility].  [name]  PAROTID GLAND TUMOR EXCISION  1973  . VASECTOMY      Current Outpatient Medications  Medication Sig Dispense Refill  . acetaminophen (TYLENOL) 500 MG tablet Take 500 mg by mouth every 6 (six) hours as needed. Patient states he can take 2 tablets daily as needed for pain    . atorvastatin (LIPITOR) 40 MG tablet Take 40 mg by mouth daily.    . cetirizine (ZYRTEC) 10 MG tablet Take 10 mg by mouth as needed.     . diphenoxylate-atropine (LOMOTIL) 2.5-0.025 MG per tablet Take 1 tablet by mouth 4 (four) times daily as needed for diarrhea or loose stools. 30 tablet 0  . finasteride (PROSCAR) 5 MG tablet Take 1 tablet (5 mg total) by mouth daily. 90 tablet 3  . furosemide (LASIX) 20 MG tablet Take 1 tablet (20 mg total) by mouth 2 (two) times daily as needed. 180 tablet 3  . gabapentin (NEURONTIN) 300 MG capsule Take 300 mg by mouth 3 (three) times daily.    [name] glucose blood test strip Accuchek compact plus. Dx E11.9 -Check sugars twice a day 100 each 3  . hydrALAZINE (APRESOLINE) 100 MG tablet Take 100 mg by mouth 2 (two) times daily.    [name] ketoconazole (NIZORAL) 2 % cream     . magnesium gluconate (MAGONATE) 500 MG tablet Take 500 mg by mouth daily.    . metFORMIN (GLUCOPHAGE) 500 MG tablet Take 500 mg by mouth 2 (two) times daily with a meal.    . metoprolol succinate (TOPROL-XL) 50 MG 24 hr tablet Take 50 mg by mouth daily. Take with or immediately following a meal.    . Multiple Vitamins-Minerals (MULTIVITAL) tablet Take 1 tablet by mouth daily.      . nitroGLYCERIN (NITROSTAT) 0.4 MG SL tablet Place 0.4 mg under the tongue every 5 (five) minutes as needed.      . pramipexole (MIRAPEX) 0.25 MG tablet Take 1 tablet (0.25 mg total) by mouth 3 (three) times daily. 270 tablet 3  . promethazine (PHENERGAN) 12.5 MG tablet Take 1 tablet (12.5 mg total) by mouth every 8 (eight) hours as needed for nausea. 20 tablet 0  . quinapril (ACCUPRIL) 20 MG tablet TAKE 1 & 1/2 TABLETS BY MOUTH ONCE DAILY 135 tablet 3   . Rivaroxaban (XARELTO) 20 MG TABS tablet Take 1 tablet (20 mg total) by mouth daily. 90 tablet 3  . metoCLOPramide (REGLAN) 10 MG tablet Take 1 tablet (10 mg total) by mouth every 8 (eight) hours as needed for nausea. 20 tablet 0   No current facility-administered medications for this visit.      Allergies:   Iodinated diagnostic agents and Other   Social History:  The patient  reports that he has quit smoking. His smoking use included cigarettes. He has a 20.00 pack-year smoking history. He has never used smokeless tobacco. He reports that he does not drink alcohol or use drugs.   Family History:   family history includes Angina in his father; Anxiety disorder in his brother; Cancer in his father and other; Colon polyps in his sister; Coronary artery disease in his other; Emphysema in his father; Heart attack in his brother and mother;  Heart disease in his maternal aunt; Heart failure in his father; Lymphoma in his sister.    Review of Systems: Review of Systems  Constitutional: Positive for malaise/fatigue.       Weight gain  Respiratory: Positive for shortness of breath.   Cardiovascular: Positive for leg swelling.  Gastrointestinal: Negative.   Musculoskeletal: Negative.   Neurological: Negative.   Psychiatric/Behavioral: Negative.   All other systems reviewed and are negative.    PHYSICAL EXAM: VS:  BP (!) 152/64 (BP Location: Left Arm, Patient Position: Sitting, Cuff Size: Normal)   Pulse 77   Ht 5' 8.5" (1.74 m)   Wt 218 lb 8 oz (99.1 kg)   BMI 32.74 kg/m  , BMI Body mass index is 32.74 kg/m. Constitutional:  oriented to person, place, and time. No distress. Obese HENT:  Head: Normocephalic and atraumatic.  Eyes:  no discharge. No scleral icterus.  Neck: Normal range of motion. Neck supple. No JVD present.  Cardiovascular: Normal rate, regular rhythm, normal heart sounds and intact distal pulses. Exam reveals no gallop and no friction rub.  1-2+ pitting edema to  below the knees No murmur heard. Pulmonary/Chest: Effort normal and breath sounds normal. No stridor. No respiratory distress.  no wheezes.  no rales.  no tenderness.  Abdominal: Soft.  no distension.  no tenderness.  Musculoskeletal: Normal range of motion.  no  tenderness or deformity.  Neurological:  normal muscle tone. Coordination normal. No atrophy Skin: Skin is warm and dry. No rash noted. not diaphoretic.  Psychiatric:  normal mood and affect. behavior is normal. Thought content normal.       Recent Labs: 03/09/2017: Creatinine, Ser 1.00    Lipid Panel Lab Results  Component Value Date   CHOL 147 06/06/2013   HDL 37.90 (L) 06/06/2013   LDLCALC 61 06/06/2013   TRIG 243.0 (H) 06/06/2013      Wt Readings from Last 3 Encounters:  06/20/17 218 lb 8 oz (99.1 kg)  12/06/16 214 lb (97.1 kg)  06/17/16 210 lb (95.3 kg)       ASSESSMENT AND PLAN:  Essential hypertension - Plan: EKG 12-Lead Blood pressure running high, possibly in the setting of fluid retention Will start Lasix on a regular basis as detailed below  Acute on chronic diastolic CHF Stop taking his Lasix, eating out on a regular basis Trace edema on previous visits now significant 1-2+ pitting edema to below the knees with shortness of breath and 9 to 10 pound weight gain Recommended he stop eating out, decrease his fluid intake and salt intake  Recommend he take Lasix 20 twice daily with potassium 10 twice daily at least for 1 week then down to Lasix 20 daily with potassium 10 daily once his symptoms start improving The would like to eat out on a frequent basis he will have to take Lasix on a regular basis  Atherosclerosis of native coronary artery with angina pectoris, unspecified whether native or transplanted heart ([place]) -  Echocardiogram has been ordered given symptoms of worsening diastolic CHF detailed above For low ejection fraction we would order ischemic work-up  Carotid disease 40% disease on  the left in 2015 Stressed importance of aggressive diet, weight loss, statin  Hyperlipidemia Cholesterol is at goal on the current lipid regimen. No changes to the medications were made. Stable   Total encounter time more than 45 minutes  Greater than 50% was spent in counseling and coordination of care with the patient   Disposition:   F/U  1 month   No orders of the defined types were placed in this encounter.    Signed, Esmond Plants, M.D., Ph.D. 06/20/2017  [place], [place]

## 2017-06-27 ENCOUNTER — Ambulatory Visit: Payer: PPO | Admitting: Cardiovascular Disease

## 2017-06-27 ENCOUNTER — Encounter

## 2017-07-03 ENCOUNTER — Other Ambulatory Visit
Admission: RE | Admit: 2017-07-03 | Discharge: 2017-07-03 | Disposition: A | Discharge status: Home / Self Care | Payer: PPO | Source: Ambulatory Visit | Attending: Cardiovascular Disease | Admitting: Cardiovascular Disease

## 2017-07-03 DIAGNOSIS — I25118 Atherosclerotic heart disease of native coronary artery with other forms of angina pectoris: Secondary | ICD-10-CM | POA: Diagnosis not present

## 2017-07-03 DIAGNOSIS — I5033 Acute on chronic diastolic (congestive) heart failure: Secondary | ICD-10-CM | POA: Diagnosis not present

## 2017-07-03 LAB — BASIC METABOLIC PANEL
Anion gap: 10 (ref 5–15)
BUN: 20 mg/dL (ref 6–20)
CALCIUM: 9.1 mg/dL (ref 8.9–10.3)
CHLORIDE: 101 mmol/L (ref 101–111)
CO2: 25 mmol/L (ref 22–32)
Creatinine, Ser: 1.15 mg/dL (ref 0.61–1.24)
GFR calc Af Amer: >60 mL/min (ref >60)
GFR calc non Af Amer: 59 mL/min — ABNORMAL LOW (ref >60)
GLUCOSE: 165 mg/dL — AB (ref 65–99)
Potassium: 4.6 mmol/L (ref 3.5–5.1)
Sodium: 136 mmol/L (ref 135–145)

## 2017-07-07 ENCOUNTER — Other Ambulatory Visit: Payer: Self-pay

## 2017-07-07 ENCOUNTER — Ambulatory Visit (INDEPENDENT_AMBULATORY_CARE_PROVIDER_SITE_OTHER): Payer: PPO

## 2017-07-07 DIAGNOSIS — I25118 Atherosclerotic heart disease of native coronary artery with other forms of angina pectoris: Secondary | ICD-10-CM

## 2017-07-07 DIAGNOSIS — I5033 Acute on chronic diastolic (congestive) heart failure: Secondary | ICD-10-CM | POA: Diagnosis not present

## 2017-07-07 MED ORDER — PERFLUTREN LIPID MICROSPHERE
1.0000 mL | INTRAVENOUS | Status: AC | PRN
  Administered 2017-07-07: 2 mL via INTRAVENOUS

## 2017-07-20 DIAGNOSIS — M461 Sacroiliitis, not elsewhere classified: Secondary | ICD-10-CM | POA: Diagnosis not present

## 2017-07-20 DIAGNOSIS — M62838 Other muscle spasm: Secondary | ICD-10-CM | POA: Diagnosis not present

## 2017-07-20 DIAGNOSIS — M9901 Segmental and somatic dysfunction of cervical region: Secondary | ICD-10-CM | POA: Diagnosis not present

## 2017-07-20 DIAGNOSIS — M9903 Segmental and somatic dysfunction of lumbar region: Secondary | ICD-10-CM | POA: Diagnosis not present

## 2017-07-20 DIAGNOSIS — M545 Low back pain: Secondary | ICD-10-CM | POA: Diagnosis not present

## 2017-07-20 DIAGNOSIS — M9904 Segmental and somatic dysfunction of sacral region: Secondary | ICD-10-CM | POA: Diagnosis not present

## 2017-07-20 DIAGNOSIS — M542 Cervicalgia: Secondary | ICD-10-CM | POA: Diagnosis not present

## 2017-07-21 DIAGNOSIS — M9904 Segmental and somatic dysfunction of sacral region: Secondary | ICD-10-CM | POA: Diagnosis not present

## 2017-07-21 DIAGNOSIS — M9901 Segmental and somatic dysfunction of cervical region: Secondary | ICD-10-CM | POA: Diagnosis not present

## 2017-07-21 DIAGNOSIS — M542 Cervicalgia: Secondary | ICD-10-CM | POA: Diagnosis not present

## 2017-07-21 DIAGNOSIS — M62838 Other muscle spasm: Secondary | ICD-10-CM | POA: Diagnosis not present

## 2017-07-21 DIAGNOSIS — M9903 Segmental and somatic dysfunction of lumbar region: Secondary | ICD-10-CM | POA: Diagnosis not present

## 2017-07-21 DIAGNOSIS — M461 Sacroiliitis, not elsewhere classified: Secondary | ICD-10-CM | POA: Diagnosis not present

## 2017-07-21 DIAGNOSIS — M545 Low back pain: Secondary | ICD-10-CM | POA: Diagnosis not present

## 2017-07-22 NOTE — Progress Notes (Signed)
Cardiology Office Note  Date:  07/24/2017   ID:  Kevin Choi, DOB 20-Feb-1941, MRN [id number]  PCP:  [name], MD   Chief Complaint  Patient presents with  . other    1 month follow up. Meds reviewed by the pt. verbally. Pt. c/o shortness of breath with exertion.     HPI:  77 year old gentleman with a history of  coronary artery disease,  bypass surgery with a LIMA to the LAD in 2002,  vein graft to the diagonal,  diabetes, HBA1C 6.7 hypertension,  hyperlipidemia,  pleural effusion requiring a VATS,  carotid endarterectomy on the right in 2001,  TIA symptoms 3 times, once in 2002, October 2012 in March 2013. Status post bilateral total knee replacements 40% left carotid arterial disease who presents for routine followup of his coronary artery disease ,  leg swelling and shortness of breath.  taking lasix once a day, sometimes twice a day Does not drink a lot of fluids Weight down 6 pounds at home Eating out less  On his last clinic visit he had weight gain, Shortness of breath on exertion,  worsening leg swelling  Weight up 8 to 9 pounds on our scale Lasix increased up to twice a day Previously was only taking this as needed Now ports blood pressure improved to leg swelling resolved  Does 30 min on the treadmill Also uses a bike Denies any chest pain or shortness of breath concerning for angina  Echo 06/2017 Left ventricle: The cavity size was normal. There was moderate   concentric hypertrophy. Systolic function was mildly to   moderately reduced. The estimated ejection fraction was in the   range of 40% to 45%. Images were inadequate for LV wall motion   assessment. Doppler parameters are consistent with abnormal left   ventricular relaxation (grade 1 diastolic dysfunction). - Ventricular septum: Septal motion showed abnormal function and   dyssynergy. - Right atrium: The atrium was mildly dilated. - Pulmonary arteries: Systolic pressure could not be  accurately   estimated.  Previous stress test April 2018 reviewed with him in detail Left bundle branch block at that time No ischemia Ejection fraction 47%  Cholesterol at goal Hemoglobin A1c 6.7  Other past medical history reviewed He reports having possible hearing problem,  led to MRI of the brain Discovery of mass right side of the brain Evaluated at [facility] found to have meningioma Had resection  04/21/2017 surgery resection    EKG personally reviewed by myself on todays visit shows normal sinus rhythm with rate 58 bpm,  left bundle branch block  Other past medical history  May 14 2011  he had acute onset of slurred speech. He was playing cards shortly after and he was unable to do any math. Symptoms lasted for approximately one to 2 hours. Blood pressure prior to this event had been well controlled though during and after the event, he had severe hypertension with systolic pressures sometimes greater than 200. He was in the emergency room for 2 days with extensive testing.  carotid ultrasound that showed no hemodynamically significant stenosis Head CT scan showing no acute stroke MRI of the head showing no infarction. Stable appearing area of enhancement of the right temporal lobe most compatible with meningioma Chest x-ray was essentially normal  Stress test April 2009 shows no significant ischemia. This was a pharmacologic study.   PMH:   has a past medical history of Allergic rhinitis due to pollen, CAD (coronary artery disease) (2002), Chicken  pox, Diabetes ([place]), Diabetes mellitus, Elevated prostate specific antigen (PSA) (02/21/2001), Headache(784.0), Hemiplegic migraine, without mention of intractable migraine without mention of status migrainosus, HLD (hyperlipidemia), HTN (hypertension) (05/08/2007), Hyperhidrosis, Impotence of organic origin (07/24/2007), Leg pain, Measles, Mumps, Nausea with vomiting, Neuropathy, Occlusion and stenosis of carotid artery  without mention of cerebral infarction (09/15/2006), Peripheral neuropathy (01/31/2008), Peripheral vascular disease ([employer]), Personal history of urinary calculi, Pleural effusion (2007), Restless legs syndrome (RLS) (01/10/2007), Slurred speech, and TIA (transient ischemic attack).  PSH:    Past Surgical History:  Procedure Laterality Date  . CAROTID ENDARTERECTOMY  2001   right  . CHOLECYSTECTOMY  2003  . collapse lung  2007  . CORONARY ARTERY BYPASS GRAFT  2001  . KNEE SURGERY Left 1981  . LUNG SURGERY  2007   Left; decortication left lung for persistant pleural effusion and PTX. admission [facility] at [facility].  [name] PAROTID GLAND TUMOR EXCISION  1973  . VASECTOMY      Current Outpatient Medications  Medication Sig Dispense Refill  . acetaminophen (TYLENOL) 500 MG tablet Take 500 mg by mouth every 6 (six) hours as needed. Patient states he can take 2 tablets daily as needed for pain    . atorvastatin (LIPITOR) 40 MG tablet Take 40 mg by mouth daily.    . cetirizine (ZYRTEC) 10 MG tablet Take 10 mg by mouth as needed.     . diphenoxylate-atropine (LOMOTIL) 2.5-0.025 MG per tablet Take 1 tablet by mouth 4 (four) times daily as needed for diarrhea or loose stools. 30 tablet 0  . finasteride (PROSCAR) 5 MG tablet Take 1 tablet (5 mg total) by mouth daily. 90 tablet 3  . furosemide (LASIX) 20 MG tablet Take 1 tablet (20 mg total) by mouth 2 (two) times daily as needed. 180 tablet 3  . gabapentin (NEURONTIN) 300 MG capsule Take 300 mg by mouth 3 (three) times daily.    [name] glucose blood test strip Accuchek compact plus. Dx E11.9 -Check sugars twice a day 100 each 3  . hydrALAZINE (APRESOLINE) 100 MG tablet Take 100 mg by mouth 2 (two) times daily.    [name] ketoconazole (NIZORAL) 2 % cream     . magnesium gluconate (MAGONATE) 500 MG tablet Take 500 mg by mouth daily.    . metFORMIN (GLUCOPHAGE) 500 MG tablet Take 500 mg by mouth 2 (two) times daily with a meal.    . metoprolol succinate (TOPROL-XL) 50 MG  24 hr tablet Take 50 mg by mouth daily. Take with or immediately following a meal.    . Multiple Vitamins-Minerals (MULTIVITAL) tablet Take 1 tablet by mouth daily.      . nitroGLYCERIN (NITROSTAT) 0.4 MG SL tablet Place 0.4 mg under the tongue every 5 (five) minutes as needed.      . potassium chloride (K-DUR) 10 MEQ tablet Take 1 tablet (10 mEq total) by mouth 2 (two) times daily. 180 tablet 3  . pramipexole (MIRAPEX) 0.25 MG tablet Take 1 tablet (0.25 mg total) by mouth 3 (three) times daily. 270 tablet 3  . promethazine (PHENERGAN) 12.5 MG tablet Take 1 tablet (12.5 mg total) by mouth every 8 (eight) hours as needed for nausea. 20 tablet 0  . quinapril (ACCUPRIL) 20 MG tablet TAKE 1 & 1/2 TABLETS BY MOUTH ONCE DAILY 135 tablet 3  . Rivaroxaban (XARELTO) 20 MG TABS tablet Take 1 tablet (20 mg total) by mouth daily. 90 tablet 3   No current facility-administered medications for this visit.  Allergies:   Iodinated diagnostic agents and Other   Social History:  The patient  reports that he has quit smoking. His smoking use included cigarettes. He has a 20.00 pack-year smoking history. He has never used smokeless tobacco. He reports that he does not drink alcohol or use drugs.   Family History:   family history includes Angina in his father; Anxiety disorder in his brother; Cancer in his father and other; Colon polyps in his sister; Coronary artery disease in his other; Emphysema in his father; Heart attack in his brother and mother; Heart disease in his maternal aunt; Heart failure in his father; Lymphoma in his sister.    Review of Systems: Review of Systems  Constitutional: Negative.        Weight gain  Respiratory: Positive for shortness of breath.   Cardiovascular: Negative.   Gastrointestinal: Negative.   Musculoskeletal: Negative.   Neurological: Negative.   Psychiatric/Behavioral: Negative.   All other systems reviewed and are negative.    PHYSICAL EXAM: VS:  BP 110/62  (BP Location: Left Arm, Patient Position: Sitting, Cuff Size: Normal)   Pulse (!) 58   Ht 5\' 8"  (1.727 m)   Wt 214 lb 8 oz (97.3 kg)   BMI 32.61 kg/m  , BMI Body mass index is 32.61 kg/m. Constitutional:  oriented to person, place, and time. No distress. Obese HENT:  Head: Normocephalic and atraumatic.  Eyes:  no discharge. No scleral icterus.  Neck: Normal range of motion. Neck supple. No JVD present.  Cardiovascular: Normal rate, regular rhythm, normal heart sounds and intact distal pulses. Exam reveals no gallop and no friction rub.  Edema resolved No murmur heard. Pulmonary/Chest: Effort normal and breath sounds normal. No stridor. No respiratory distress.  no wheezes.  no rales.  no tenderness.  Abdominal: Soft.  no distension.  no tenderness.  Musculoskeletal: Normal range of motion.  no  tenderness or deformity.  Neurological:  normal muscle tone. Coordination normal. No atrophy Skin: Skin is warm and dry. No rash noted. not diaphoretic.  Psychiatric:  normal mood and affect. behavior is normal. Thought content normal.       Recent Labs: 07/03/2017: BUN 20; Creatinine, Ser 1.15; Potassium 4.6; Sodium 136    Lipid Panel Lab Results  Component Value Date   CHOL 147 06/06/2013   HDL 37.90 (L) 06/06/2013   LDLCALC 61 06/06/2013   TRIG 243.0 (H) 06/06/2013      Wt Readings from Last 3 Encounters:  07/24/17 214 lb 8 oz (97.3 kg)  06/20/17 218 lb 8 oz (99.1 kg)  12/06/16 214 lb (97.1 kg)       ASSESSMENT AND PLAN:  Essential hypertension - Plan: EKG 12-Lead Blood pressure improved on Lasix He'll continue to monitor blood pressure at home  Acute on chronic diastolic CHF Previously stopped Lasix and had 8-9 pound weight gain with leg swelling and hypertension Now on Lasix once or twice a day eating out last watching his salt intake Leg swelling resolved blood pressure improved shortness of breath improving He is back to exercising  Atherosclerosis of native  coronary artery with angina pectoris, unspecified whether native or transplanted heart (HCC) -  Ejection fraction 40-45%,  Previous stress test 2018 with ejection fraction 47% We have requested old records after bypass surgery and when followed by cardiology No prior echocardiogram available for comparison Currently asymptomatic  Carotid disease 40% disease on the left in 2015 Stressed importance of aggressive diet, weight loss, statin Cholesterol at goal  Hyperlipidemia Cholesterol is at goal on the current lipid regimen. No changes to the medications were made. Stable   Total encounter time more than 25 minutes  Greater than 50% was spent in counseling and coordination of care with the patient   Disposition:   F/U  12 month   Orders Placed This Encounter  Procedures  . EKG 12-Lead     Signed, Esmond Plants, M.D., Ph.D. 07/24/2017  [place], [address]

## 2017-07-24 ENCOUNTER — Encounter: Payer: Self-pay | Admitting: Cardiovascular Disease

## 2017-07-24 ENCOUNTER — Ambulatory Visit (INDEPENDENT_AMBULATORY_CARE_PROVIDER_SITE_OTHER): Payer: PPO | Admitting: Cardiovascular Disease

## 2017-07-24 VITALS — BP 110/62 | HR 58 | Ht 68.0 in | Wt 214.5 lb

## 2017-07-24 DIAGNOSIS — I5033 Acute on chronic diastolic (congestive) heart failure: Secondary | ICD-10-CM

## 2017-07-24 DIAGNOSIS — M542 Cervicalgia: Secondary | ICD-10-CM | POA: Diagnosis not present

## 2017-07-24 DIAGNOSIS — I25118 Atherosclerotic heart disease of native coronary artery with other forms of angina pectoris: Secondary | ICD-10-CM

## 2017-07-24 DIAGNOSIS — M545 Low back pain: Secondary | ICD-10-CM | POA: Diagnosis not present

## 2017-07-24 DIAGNOSIS — G459 Transient cerebral ischemic attack, unspecified: Secondary | ICD-10-CM

## 2017-07-24 DIAGNOSIS — I10 Essential (primary) hypertension: Secondary | ICD-10-CM

## 2017-07-24 DIAGNOSIS — E1142 Type 2 diabetes mellitus with diabetic polyneuropathy: Secondary | ICD-10-CM | POA: Diagnosis not present

## 2017-07-24 DIAGNOSIS — M461 Sacroiliitis, not elsewhere classified: Secondary | ICD-10-CM | POA: Diagnosis not present

## 2017-07-24 DIAGNOSIS — M9904 Segmental and somatic dysfunction of sacral region: Secondary | ICD-10-CM | POA: Diagnosis not present

## 2017-07-24 DIAGNOSIS — E782 Mixed hyperlipidemia: Secondary | ICD-10-CM | POA: Diagnosis not present

## 2017-07-24 DIAGNOSIS — M9901 Segmental and somatic dysfunction of cervical region: Secondary | ICD-10-CM | POA: Diagnosis not present

## 2017-07-24 DIAGNOSIS — M62838 Other muscle spasm: Secondary | ICD-10-CM | POA: Diagnosis not present

## 2017-07-24 DIAGNOSIS — E1143 Type 2 diabetes mellitus with diabetic autonomic (poly)neuropathy: Secondary | ICD-10-CM | POA: Diagnosis not present

## 2017-07-24 DIAGNOSIS — M9903 Segmental and somatic dysfunction of lumbar region: Secondary | ICD-10-CM | POA: Diagnosis not present

## 2017-07-24 NOTE — Patient Instructions (Signed)

## 2017-09-28 DIAGNOSIS — Z96653 Presence of artificial knee joint, bilateral: Secondary | ICD-10-CM | POA: Diagnosis not present

## 2017-10-03 DIAGNOSIS — I10 Essential (primary) hypertension: Secondary | ICD-10-CM | POA: Diagnosis not present

## 2017-10-03 DIAGNOSIS — E7800 Pure hypercholesterolemia, unspecified: Secondary | ICD-10-CM | POA: Diagnosis not present

## 2017-10-03 DIAGNOSIS — E1143 Type 2 diabetes mellitus with diabetic autonomic (poly)neuropathy: Secondary | ICD-10-CM | POA: Diagnosis not present

## 2017-10-04 ENCOUNTER — Ambulatory Visit: Payer: PPO | Admitting: Urology

## 2017-10-04 ENCOUNTER — Encounter: Payer: Self-pay | Admitting: Urology

## 2017-10-04 VITALS — BP 129/62 | HR 71 | Ht 68.5 in | Wt 217.2 lb

## 2017-10-04 DIAGNOSIS — R972 Elevated prostate specific antigen [PSA]: Secondary | ICD-10-CM | POA: Diagnosis not present

## 2017-10-04 DIAGNOSIS — R3914 Feeling of incomplete bladder emptying: Secondary | ICD-10-CM

## 2017-10-04 DIAGNOSIS — N401 Enlarged prostate with lower urinary tract symptoms: Secondary | ICD-10-CM | POA: Diagnosis not present

## 2017-10-04 LAB — BLADDER SCAN AMB NON-IMAGING

## 2017-10-04 NOTE — Progress Notes (Signed)
10/04/2017 11:17 AM   Kevin Choi 01-Nov-1940 [id number]  Referring provider: [name], MD [place] [facility] [place],  [id number]  Chief Complaint  Patient presents with  . Establish Care    HPI: 77 year old male presents today to establish local urologic care.  He has been followed for several years by Dr. Jacqlyn Larsen for BPH and an elevated PSA.  He last saw Dr. Jacqlyn Larsen at [facility] in September 2018.  Review of available records in epic dating back to 2016 do not mention any history of a prostate biopsy however the patient states he had 4-5 prostate biopsies between 2003 and 2010 all being benign.  He remains on finasteride and since his last visit has noted occasional episodes of urge incontinence and postvoid dribbling for which she wears a pad.  He denies dysuria or gross hematuria.  Denies flank, abdominal, pelvic or scrotal pain.  He has a history of a mildly elevated PVR around 170 mL.  His last PSA was in September 2018 and was 1.79 (uncorrected).   PMH: Past Medical History:  Diagnosis Date  . Allergic rhinitis due to pollen   . CAD (coronary artery disease) 2002   bypass  . Chicken pox   . Diabetes ([place])   . Diabetes mellitus   . Elevated prostate specific antigen (PSA) 02/21/2001  . Headache(784.0)   . Hemiplegic migraine, without mention of intractable migraine without mention of status migrainosus   . HLD (hyperlipidemia)   . HTN (hypertension) 05/08/2007  . Hyperhidrosis   . Impotence of organic origin 07/24/2007  . Leg pain   . Measles   . Mumps   . Nausea with vomiting   . Neuropathy   . Occlusion and stenosis of carotid artery without mention of cerebral infarction 09/15/2006  . Peripheral neuropathy 01/31/2008  . Peripheral vascular disease ([place])   . Personal history of urinary calculi   . Pleural effusion 2007   with pneumothorax   . Restless legs syndrome (RLS) 01/10/2007  . Slurred speech   . TIA (transient ischemic  attack)    Patient stated 2 months ago    Surgical History: Past Surgical History:  Procedure Laterality Date  . CAROTID ENDARTERECTOMY  2001   right  . CHOLECYSTECTOMY  2003  . collapse lung  2007  . CORONARY ARTERY BYPASS GRAFT  2001  . KNEE SURGERY Left 1981  . LUNG SURGERY  2007   Left; decortication left lung for persistant pleural effusion and PTX. admission ten [facility] at [facility].  [name] PAROTID GLAND TUMOR EXCISION  1973  . VASECTOMY      Home Medications:  Allergies as of 10/04/2017      Reactions   Iodinated Diagnostic Agents Nausea Only   Hypotension   Other Nausea Only, Other (See Comments)   Contrast Dye-drops blood pressure. Contrast Dye- hypotension and nausa and syncope      Medication List        Accurate as of 10/04/17 11:17 AM. Always use your most recent med list.          acetaminophen 500 MG tablet Commonly known as:  TYLENOL Take 500 mg by mouth every 6 (six) hours as needed. Patient states he can take 2 tablets daily as needed for pain   atorvastatin 40 MG tablet Commonly known as:  LIPITOR Take 40 mg by mouth daily.   cetirizine 10 MG tablet Commonly known as:  ZYRTEC Take 10 mg by mouth as needed.   diphenoxylate-atropine  2.5-0.025 MG tablet Commonly known as:  LOMOTIL Take 1 tablet by mouth 4 (four) times daily as needed for diarrhea or loose stools.   finasteride 5 MG tablet Commonly known as:  PROSCAR Take 1 tablet (5 mg total) by mouth daily.   furosemide 20 MG tablet Commonly known as:  LASIX Take 1 tablet (20 mg total) by mouth 2 (two) times daily as needed.   gabapentin 300 MG capsule Commonly known as:  NEURONTIN Take 300 mg by mouth 3 (three) times daily.   glucose blood test strip Accuchek compact plus. Dx E11.9 -Check sugars twice a day   hydrALAZINE 100 MG tablet Commonly known as:  APRESOLINE Take 100 mg by mouth 2 (two) times daily.   ketoconazole 2 % cream Commonly known as:  NIZORAL   magnesium gluconate 500 MG  tablet Commonly known as:  MAGONATE Take 500 mg by mouth daily.   metFORMIN 500 MG tablet Commonly known as:  GLUCOPHAGE Take 500 mg by mouth 2 (two) times daily with a meal.   metoprolol succinate 50 MG 24 hr tablet Commonly known as:  TOPROL-XL Take 50 mg by mouth daily. Take with or immediately following a meal.   MULTIVITAL tablet Take 1 tablet by mouth daily.   NITROSTAT 0.4 MG SL tablet Generic drug:  nitroGLYCERIN Place 0.4 mg under the tongue every 5 (five) minutes as needed.   potassium chloride 10 MEQ tablet Commonly known as:  K-DUR Take 1 tablet (10 mEq total) by mouth 2 (two) times daily.   pramipexole 0.25 MG tablet Commonly known as:  MIRAPEX Take 1 tablet (0.25 mg total) by mouth 3 (three) times daily.   promethazine 12.5 MG tablet Commonly known as:  PHENERGAN Take 1 tablet (12.5 mg total) by mouth every 8 (eight) hours as needed for nausea.   quinapril 20 MG tablet Commonly known as:  ACCUPRIL TAKE 1 & 1/2 TABLETS BY MOUTH ONCE DAILY   rivaroxaban 20 MG Tabs tablet Commonly known as:  XARELTO Take 1 tablet (20 mg total) by mouth daily.       Allergies:  Allergies  Allergen Reactions  . Iodinated Diagnostic Agents Nausea Only    Hypotension  . Other Nausea Only and Other (See Comments)    Contrast Dye-drops blood pressure. Contrast Dye- hypotension and nausa and syncope    Family History: Family History  Problem Relation Age of Onset  . Heart attack Mother   . Heart failure Father   . Angina Father   . Emphysema Father   . Cancer Father        prostate  . Lymphoma Sister        Non-Hodgkins  . Cancer Other   . Coronary artery disease Other        brother  . Heart attack Brother   . Heart disease Maternal Aunt   . Colon polyps Sister   . Anxiety disorder Brother     Social History:  reports that he has quit smoking. His smoking use included cigarettes. He has a 20.00 pack-year smoking history. He has never used smokeless tobacco.  He reports that he does not drink alcohol or use drugs.  ROS: UROLOGY Frequent Urination?: Yes Hard to postpone urination?: Yes Burning/pain with urination?: No Get up at night to urinate?: Yes Leakage of urine?: Yes Urine stream starts and stops?: No Trouble starting stream?: No Do you have to strain to urinate?: No Blood in urine?: No Urinary tract infection?: No Sexually transmitted disease?: No  Injury to kidneys or bladder?: No Painful intercourse?: No Weak stream?: No Erection problems?: Yes Penile pain?: No  Gastrointestinal Nausea?: No Vomiting?: No Indigestion/heartburn?: Yes Diarrhea?: No Constipation?: No  Constitutional Fever: No Night sweats?: No Weight loss?: No Fatigue?: No  Skin Skin rash/lesions?: No Itching?: No  Eyes Blurred vision?: No Double vision?: No  Ears/Nose/Throat Sore throat?: No Sinus problems?: Yes  Hematologic/Lymphatic Swollen glands?: No Easy bruising?: No  Cardiovascular Leg swelling?: Yes Chest pain?: No  Respiratory Cough?: No Shortness of breath?: Yes  Endocrine Excessive thirst?: No  Musculoskeletal Back pain?: Yes Joint pain?: Yes  Neurological Headaches?: No Dizziness?: No  Psychologic Depression?: No Anxiety?: No  Physical Exam: BP 129/62 (BP Location: Left Arm, Patient Position: Sitting, Cuff Size: Large)   Pulse 71   Ht 5' 8.5" (1.74 m)   Wt 217 lb 3.2 oz (98.5 kg)   BMI 32.54 kg/m   Constitutional:  Alert and oriented, No acute distress. HEENT: [place] AT, moist mucus membranes.  Trachea midline, no masses. Cardiovascular: No clubbing, cyanosis, or edema. Respiratory: Normal respiratory effort, no increased work of breathing. GI: Abdomen is soft, nontender, nondistended, no abdominal masses GU: No CVA tenderness.  Prostate 40 g, smooth without nodules Lymph: No cervical or inguinal lymphadenopathy. Skin: No rashes, bruises or suspicious lesions. Neurologic: Grossly intact, no focal deficits,  moving all 4 extremities. Psychiatric: Normal mood and affect.   Assessment & Plan:    1. Benign prostatic hyperplasia with incomplete bladder emptying PVR by bladder scan today was 134 mL.  Continue finasteride and annual follow-up.  2. Elevated PSA Will attempt to obtain his prior private practice records regarding biopsies and PSA results.  He desires to continue PSA testing. Continue annual follow-up.    Abbie Sons, [place] [address] [place], [place] [address] [phone]

## 2017-10-05 LAB — PSA: Prostate Specific Ag, Serum: 2.5 ng/mL (ref 0.0–4.0)

## 2017-10-08 ENCOUNTER — Encounter: Payer: Self-pay | Admitting: Urology

## 2017-10-10 DIAGNOSIS — E7800 Pure hypercholesterolemia, unspecified: Secondary | ICD-10-CM | POA: Diagnosis not present

## 2017-10-10 DIAGNOSIS — Z6833 Body mass index (BMI) 33.0-33.9, adult: Secondary | ICD-10-CM | POA: Diagnosis not present

## 2017-10-10 DIAGNOSIS — I5022 Chronic systolic (congestive) heart failure: Secondary | ICD-10-CM | POA: Diagnosis not present

## 2017-10-10 DIAGNOSIS — I10 Essential (primary) hypertension: Secondary | ICD-10-CM | POA: Diagnosis not present

## 2017-10-10 DIAGNOSIS — E1143 Type 2 diabetes mellitus with diabetic autonomic (poly)neuropathy: Secondary | ICD-10-CM | POA: Diagnosis not present

## 2017-10-10 DIAGNOSIS — E6609 Other obesity due to excess calories: Secondary | ICD-10-CM | POA: Diagnosis not present

## 2017-10-18 ENCOUNTER — Telehealth: Payer: Self-pay

## 2017-10-18 DIAGNOSIS — R972 Elevated prostate specific antigen [PSA]: Secondary | ICD-10-CM

## 2017-10-18 NOTE — Telephone Encounter (Signed)
-----   Message from Abbie Sons, MD sent at 10/17/2017  9:13 PM EDT ----- PSA was slightly higher at 2.5.  Repeat PSA in 6 months

## 2017-12-19 ENCOUNTER — Other Ambulatory Visit: Payer: Self-pay | Admitting: Family Medicine

## 2017-12-19 MED ORDER — FINASTERIDE 5 MG PO TABS: 5.0000 mg | ORAL_TABLET | Freq: Every day | ORAL | 3 refills | Status: DC

## 2018-01-24 ENCOUNTER — Telehealth: Payer: Self-pay | Admitting: Cardiovascular Disease

## 2018-01-24 DIAGNOSIS — E1143 Type 2 diabetes mellitus with diabetic autonomic (poly)neuropathy: Secondary | ICD-10-CM

## 2018-01-24 NOTE — Telephone Encounter (Signed)
Spoke with patient and reviewed his testing and previous office visit. Encouraged him to write list of questions for his next visit so that they can answer his questions fully. We reviewed grade 1 diastolic dysfunction and ways to improve heart health with diet modifications. He was very appreciative for the call with no further questions at this time.

## 2018-01-24 NOTE — Telephone Encounter (Signed)
Pt would like to discuss his CHF. States he was not aware he had this condition.

## 2018-01-30 DIAGNOSIS — D329 Benign neoplasm of meninges, unspecified: Secondary | ICD-10-CM | POA: Diagnosis not present

## 2018-01-30 DIAGNOSIS — D320 Benign neoplasm of cerebral meninges: Secondary | ICD-10-CM | POA: Diagnosis not present

## 2018-01-30 DIAGNOSIS — D496 Neoplasm of unspecified behavior of brain: Secondary | ICD-10-CM | POA: Diagnosis not present

## 2018-01-31 DIAGNOSIS — H25813 Combined forms of age-related cataract, bilateral: Secondary | ICD-10-CM | POA: Diagnosis not present

## 2018-01-31 DIAGNOSIS — E119 Type 2 diabetes mellitus without complications: Secondary | ICD-10-CM | POA: Diagnosis not present

## 2018-03-08 ENCOUNTER — Other Ambulatory Visit: Payer: Self-pay | Admitting: Cardiovascular Disease

## 2018-04-03 DIAGNOSIS — I5022 Chronic systolic (congestive) heart failure: Secondary | ICD-10-CM | POA: Diagnosis not present

## 2018-04-03 DIAGNOSIS — I10 Essential (primary) hypertension: Secondary | ICD-10-CM | POA: Diagnosis not present

## 2018-04-03 DIAGNOSIS — E1143 Type 2 diabetes mellitus with diabetic autonomic (poly)neuropathy: Secondary | ICD-10-CM | POA: Diagnosis not present

## 2018-04-03 DIAGNOSIS — E7800 Pure hypercholesterolemia, unspecified: Secondary | ICD-10-CM | POA: Diagnosis not present

## 2018-04-10 DIAGNOSIS — E7800 Pure hypercholesterolemia, unspecified: Secondary | ICD-10-CM | POA: Diagnosis not present

## 2018-04-10 DIAGNOSIS — Z6833 Body mass index (BMI) 33.0-33.9, adult: Secondary | ICD-10-CM | POA: Diagnosis not present

## 2018-04-10 DIAGNOSIS — E1143 Type 2 diabetes mellitus with diabetic autonomic (poly)neuropathy: Secondary | ICD-10-CM | POA: Diagnosis not present

## 2018-04-10 DIAGNOSIS — I5022 Chronic systolic (congestive) heart failure: Secondary | ICD-10-CM | POA: Diagnosis not present

## 2018-04-10 DIAGNOSIS — I10 Essential (primary) hypertension: Secondary | ICD-10-CM | POA: Diagnosis not present

## 2018-04-10 DIAGNOSIS — E6609 Other obesity due to excess calories: Secondary | ICD-10-CM | POA: Diagnosis not present

## 2018-04-10 DIAGNOSIS — Z0000 Encounter for general adult medical examination without abnormal findings: Secondary | ICD-10-CM | POA: Diagnosis not present

## 2018-04-16 ENCOUNTER — Telehealth: Payer: Self-pay | Admitting: Urology

## 2018-04-16 DIAGNOSIS — M9903 Segmental and somatic dysfunction of lumbar region: Secondary | ICD-10-CM | POA: Diagnosis not present

## 2018-04-16 DIAGNOSIS — M5442 Lumbago with sciatica, left side: Secondary | ICD-10-CM | POA: Diagnosis not present

## 2018-04-16 DIAGNOSIS — M545 Low back pain: Secondary | ICD-10-CM | POA: Diagnosis not present

## 2018-04-16 DIAGNOSIS — M9904 Segmental and somatic dysfunction of sacral region: Secondary | ICD-10-CM | POA: Diagnosis not present

## 2018-04-16 DIAGNOSIS — M461 Sacroiliitis, not elsewhere classified: Secondary | ICD-10-CM | POA: Diagnosis not present

## 2018-04-16 NOTE — Telephone Encounter (Signed)
Pt would like to have PSA drawn.  This is his 6 month, he has a follow up in August.  Please add orders.

## 2018-04-18 ENCOUNTER — Other Ambulatory Visit: Payer: PPO

## 2018-04-18 DIAGNOSIS — R972 Elevated prostate specific antigen [PSA]: Secondary | ICD-10-CM | POA: Diagnosis not present

## 2018-04-19 LAB — PSA: Prostate Specific Ag, Serum: 1.4 ng/mL (ref 0.0–4.0)

## 2018-04-20 DIAGNOSIS — M9904 Segmental and somatic dysfunction of sacral region: Secondary | ICD-10-CM | POA: Diagnosis not present

## 2018-04-20 DIAGNOSIS — M545 Low back pain: Secondary | ICD-10-CM | POA: Diagnosis not present

## 2018-04-20 DIAGNOSIS — M461 Sacroiliitis, not elsewhere classified: Secondary | ICD-10-CM | POA: Diagnosis not present

## 2018-04-20 DIAGNOSIS — M9903 Segmental and somatic dysfunction of lumbar region: Secondary | ICD-10-CM | POA: Diagnosis not present

## 2018-04-20 DIAGNOSIS — M5442 Lumbago with sciatica, left side: Secondary | ICD-10-CM | POA: Diagnosis not present

## 2018-04-23 DIAGNOSIS — M9903 Segmental and somatic dysfunction of lumbar region: Secondary | ICD-10-CM | POA: Diagnosis not present

## 2018-04-23 DIAGNOSIS — M545 Low back pain: Secondary | ICD-10-CM | POA: Diagnosis not present

## 2018-04-23 DIAGNOSIS — M5442 Lumbago with sciatica, left side: Secondary | ICD-10-CM | POA: Diagnosis not present

## 2018-04-23 DIAGNOSIS — M9904 Segmental and somatic dysfunction of sacral region: Secondary | ICD-10-CM | POA: Diagnosis not present

## 2018-04-23 DIAGNOSIS — M461 Sacroiliitis, not elsewhere classified: Secondary | ICD-10-CM | POA: Diagnosis not present

## 2018-04-25 DIAGNOSIS — M9904 Segmental and somatic dysfunction of sacral region: Secondary | ICD-10-CM | POA: Diagnosis not present

## 2018-04-25 DIAGNOSIS — M461 Sacroiliitis, not elsewhere classified: Secondary | ICD-10-CM | POA: Diagnosis not present

## 2018-04-25 DIAGNOSIS — M5442 Lumbago with sciatica, left side: Secondary | ICD-10-CM | POA: Diagnosis not present

## 2018-04-25 DIAGNOSIS — M545 Low back pain: Secondary | ICD-10-CM | POA: Diagnosis not present

## 2018-04-25 DIAGNOSIS — M9903 Segmental and somatic dysfunction of lumbar region: Secondary | ICD-10-CM | POA: Diagnosis not present

## 2018-04-26 DIAGNOSIS — M9903 Segmental and somatic dysfunction of lumbar region: Secondary | ICD-10-CM | POA: Diagnosis not present

## 2018-04-26 DIAGNOSIS — M9904 Segmental and somatic dysfunction of sacral region: Secondary | ICD-10-CM | POA: Diagnosis not present

## 2018-04-26 DIAGNOSIS — M5442 Lumbago with sciatica, left side: Secondary | ICD-10-CM | POA: Diagnosis not present

## 2018-04-26 DIAGNOSIS — M461 Sacroiliitis, not elsewhere classified: Secondary | ICD-10-CM | POA: Diagnosis not present

## 2018-04-26 DIAGNOSIS — M545 Low back pain: Secondary | ICD-10-CM | POA: Diagnosis not present

## 2018-05-02 DIAGNOSIS — M461 Sacroiliitis, not elsewhere classified: Secondary | ICD-10-CM | POA: Diagnosis not present

## 2018-05-02 DIAGNOSIS — M9904 Segmental and somatic dysfunction of sacral region: Secondary | ICD-10-CM | POA: Diagnosis not present

## 2018-05-02 DIAGNOSIS — M5442 Lumbago with sciatica, left side: Secondary | ICD-10-CM | POA: Diagnosis not present

## 2018-05-02 DIAGNOSIS — M545 Low back pain: Secondary | ICD-10-CM | POA: Diagnosis not present

## 2018-05-02 DIAGNOSIS — M9903 Segmental and somatic dysfunction of lumbar region: Secondary | ICD-10-CM | POA: Diagnosis not present

## 2018-08-16 DIAGNOSIS — D2230 Melanocytic nevi of unspecified part of face: Secondary | ICD-10-CM | POA: Diagnosis not present

## 2018-08-16 DIAGNOSIS — L812 Freckles: Secondary | ICD-10-CM | POA: Diagnosis not present

## 2018-08-16 DIAGNOSIS — D1800 Hemangioma unspecified site: Secondary | ICD-10-CM | POA: Diagnosis not present

## 2018-08-16 DIAGNOSIS — Z1283 Encounter for screening for malignant neoplasm of skin: Secondary | ICD-10-CM | POA: Diagnosis not present

## 2018-08-16 DIAGNOSIS — L820 Inflamed seborrheic keratosis: Secondary | ICD-10-CM | POA: Diagnosis not present

## 2018-08-16 DIAGNOSIS — L821 Other seborrheic keratosis: Secondary | ICD-10-CM | POA: Diagnosis not present

## 2018-08-16 DIAGNOSIS — D225 Melanocytic nevi of trunk: Secondary | ICD-10-CM | POA: Diagnosis not present

## 2018-08-16 DIAGNOSIS — L570 Actinic keratosis: Secondary | ICD-10-CM | POA: Diagnosis not present

## 2018-08-16 DIAGNOSIS — D2260 Melanocytic nevi of unspecified upper limb, including shoulder: Secondary | ICD-10-CM | POA: Diagnosis not present

## 2018-08-23 ENCOUNTER — Other Ambulatory Visit: Payer: Self-pay

## 2018-08-23 ENCOUNTER — Ambulatory Visit (INDEPENDENT_AMBULATORY_CARE_PROVIDER_SITE_OTHER): Payer: PPO | Admitting: Podiatry

## 2018-08-23 ENCOUNTER — Encounter: Payer: Self-pay | Admitting: Podiatry

## 2018-08-23 DIAGNOSIS — B351 Tinea unguium: Secondary | ICD-10-CM | POA: Diagnosis not present

## 2018-08-23 DIAGNOSIS — M79675 Pain in left toe(s): Secondary | ICD-10-CM | POA: Diagnosis not present

## 2018-08-23 DIAGNOSIS — M79674 Pain in right toe(s): Secondary | ICD-10-CM

## 2018-08-23 DIAGNOSIS — E1143 Type 2 diabetes mellitus with diabetic autonomic (poly)neuropathy: Secondary | ICD-10-CM

## 2018-08-23 NOTE — Progress Notes (Signed)
Complaint:  Visit Type: Patient presents  to my office for preventative foot care services. Complaint: Patient states" my right great toenail  has grown long and thick and become painful to walk and wear shoes" Patient has been diagnosed with DM with neuropathy The patient presents for preventative foot care services. No changes to ROS  Podiatric Exam: Vascular: dorsalis pedis and posterior tibial pulses are palpable bilateral. Capillary return is immediate. Temperature gradient is WNL. Skin turgor WNL  Sensorium: Diminished  Semmes Weinstein monofilament test. Diminished  tactile sensation bilaterally. Nail Exam: Pt has thick disfigured discolored nail  Right hallux.  Left hallux nail plate removed. Ulcer Exam: There is no evidence of ulcer or pre-ulcerative changes or infection. Orthopedic Exam: Muscle tone and strength are WNL. No limitations in general ROM. No crepitus or effusions noted. Foot type and digits show no abnormalities. Bony prominences are unremarkable. Skin: No Porokeratosis. No infection or ulcers  Diagnosis:  Onychomycosis, , Pain in right toe, pain in left toes  Treatment & Plan Procedures and Treatment: Consent by patient was obtained for treatment procedures.   Debridement of mycotic and hypertrophic toenails, 1 through 5 bilateral and clearing of subungual debris. No ulceration, no infection noted.  Return Visit-Office Procedure: Patient instructed to return to the office for a follow up visit 4 months for continued evaluation and treatment.    Gardiner Barefoot DPM

## 2018-08-31 ENCOUNTER — Other Ambulatory Visit: Payer: Self-pay | Admitting: Cardiovascular Disease

## 2018-09-30 NOTE — Progress Notes (Signed)
Cardiology Office Note  Date:  10/02/2018   ID:  Kevin Choi, DOB 1940-03-26, MRN [id number]  PCP:  [name], MD   Chief Complaint  Patient presents with  . Other    12 month follow up. Patient c/o SOB. Meds reviewed verbally with patient.     HPI:  78 year old gentleman with a history of  coronary artery disease,  bypass surgery with a LIMA to the LAD in 2002,  vein graft to the diagonal,  diabetes, HBA1C 6.7 hypertension,  hyperlipidemia,  pleural effusion requiring a VATS,  carotid endarterectomy on the right in 2001,  TIA symptoms 3 times, once in 2002, October 2012 in March 2013. Status post bilateral total knee replacements 40% left carotid arterial disease who presents for routine followup of his coronary artery disease ,  leg swelling and shortness of breath.  Concerned about his diagnosis of heart failure On prior clinic visit weight was up 9 to 10 pounds required extra Lasix for shortness of breath leg swelling  Brings in a long list of blood pressure numbers, weights He is having leg swelling, shortness of breath that waxes and wanes  Went through his medication list with him Lasix twice a day Quinapril in the AM Metoprolol in PM Variable BP 120 to 150 Was taking blood pressure measurements as he was taking his morning pills, sometimes before medications  Continues to have high fluid intake, salt intake weight stable, but high for him 207 pounds Eating out less Previously was eating out frequently Not exercising at gym  Concerned that he has more shortness of breath with walking around the street that he does on the bike Unclear if he does regular exercise or sporadic  Echo 06/2017 discussed with him Left ventricle: The cavity size was normal. There was moderate   concentric hypertrophy. Systolic function was mildly to   moderately reduced. The estimated ejection fraction was in the   range of 40% to 45%. Images were inadequate for LV wall  motion   assessment. Doppler parameters are consistent with abnormal left   ventricular relaxation (grade 1 diastolic dysfunction).  EKG personally reviewed by myself on todays visit Shows normal sinus rhythm rate 58 bpm left bundle branch block  REDS VEST score performed today 32%, performed for shortness of breath and leg swelling  Previous stress test April 2018 Left bundle branch block at that time No ischemia Ejection fraction 47%  Hemoglobin A1c 6.7  He reports having possible hearing problem,  led to MRI of the brain Discovery of mass right side of the brain Evaluated at [facility] found to have meningioma Had resection  04/21/2017 surgery resection     May 14 2011  he had acute onset of slurred speech. He was playing cards shortly after and he was unable to do any math. Symptoms lasted for approximately one to 2 hours. Blood pressure prior to this event had been well controlled though during and after the event, he had severe hypertension with systolic pressures sometimes greater than 200. He was in the emergency room for 2 days with extensive testing.  carotid ultrasound that showed no hemodynamically significant stenosis Head CT scan showing no acute stroke MRI of the head showing no infarction. Stable appearing area of enhancement of the right temporal lobe most compatible with meningioma Chest x-ray was essentially normal  Stress test April 2009 shows no significant ischemia. This was a pharmacologic study.   PMH:   has a past medical history of Allergic rhinitis  due to pollen, CAD (coronary artery disease) (2002), Chicken pox, Diabetes ([place]), Diabetes mellitus, Elevated prostate specific antigen (PSA) (02/21/2001), Headache(784.0), Hemiplegic migraine, without mention of intractable migraine without mention of status migrainosus, HLD (hyperlipidemia), HTN (hypertension) (05/08/2007), Hyperhidrosis, Impotence of organic origin (07/24/2007), Leg pain, Measles, Mumps,  Nausea with vomiting, Neuropathy, Occlusion and stenosis of carotid artery without mention of cerebral infarction (09/15/2006), Peripheral neuropathy (01/31/2008), Peripheral vascular disease ([place]), Personal history of urinary calculi, Pleural effusion (2007), Restless legs syndrome (RLS) (01/10/2007), Slurred speech, and TIA (transient ischemic attack).  PSH:    Past Surgical History:  Procedure Laterality Date  . CAROTID ENDARTERECTOMY  2001   right  . CHOLECYSTECTOMY  2003  . collapse lung  2007  . CORONARY ARTERY BYPASS GRAFT  2001  . KNEE SURGERY Left 1981  . LUNG SURGERY  2007   Left; decortication left lung for persistant pleural effusion and PTX. admission ten days at [facility].  [name] PAROTID GLAND TUMOR EXCISION  1973  . VASECTOMY      Current Outpatient Medications  Medication Sig Dispense Refill  . acetaminophen (TYLENOL) 500 MG tablet Take 500 mg by mouth every 6 (six) hours as needed. Patient states he can take 2 tablets daily as needed for pain    . atorvastatin (LIPITOR) 40 MG tablet Take 40 mg by mouth daily.    . cetirizine (ZYRTEC) 10 MG tablet Take 10 mg by mouth as needed.     . finasteride (PROSCAR) 5 MG tablet Take 1 tablet (5 mg total) by mouth daily. 90 tablet 3  . furosemide (LASIX) 20 MG tablet TAKE 1 TABLET BY MOUTH TWICE A DAY AS NEEDED 180 tablet 0  . gabapentin (NEURONTIN) 300 MG capsule Take 300 mg by mouth 3 (three) times daily.    [name] glucose blood test strip     . hydrALAZINE (APRESOLINE) 100 MG tablet Take 100 mg by mouth 2 (two) times daily.    [name] ketoconazole (NIZORAL) 2 % cream     . magnesium gluconate (MAGONATE) 500 MG tablet Take 500 mg by mouth daily.    . metFORMIN (GLUCOPHAGE) 500 MG tablet Take 500 mg by mouth 2 (two) times daily with a meal.    . metoprolol succinate (TOPROL-XL) 50 MG 24 hr tablet Take 50 mg by mouth daily. Take with or immediately following a meal.    . Multiple Vitamin (MULTI-VITAMIN) tablet Take by mouth.    . Multiple  Vitamins-Minerals (MULTIVITAL) tablet Take 1 tablet by mouth daily.      . nitroGLYCERIN (NITROSTAT) 0.4 MG SL tablet Place 0.4 mg under the tongue every 5 (five) minutes as needed.      . pramipexole (MIRAPEX) 0.25 MG tablet Take 1 tablet (0.25 mg total) by mouth 3 (three) times daily. 270 tablet 3  . promethazine (PHENERGAN) 12.5 MG tablet Take 1 tablet (12.5 mg total) by mouth every 8 (eight) hours as needed for nausea. 20 tablet 0  . quinapril (ACCUPRIL) 20 MG tablet TAKE 1 AND 1/2 TABLETS BY MOUTH DAILY 135 tablet 0  . Rivaroxaban (XARELTO) 20 MG TABS tablet Take 1 tablet (20 mg total) by mouth daily. 90 tablet 3  . UNABLE TO FIND Take by mouth.    . potassium chloride (K-DUR) 10 MEQ tablet Take 1 tablet (10 mEq total) by mouth 2 (two) times daily. 180 tablet 3   No current facility-administered medications for this visit.      Allergies:   Iodinated diagnostic agents and Other  Social History:  The patient  reports that he has quit smoking. His smoking use included cigarettes. He has a 20.00 pack-year smoking history. He has never used smokeless tobacco. He reports that he does not drink alcohol or use drugs.   Family History:   family history includes Angina in his father; Anxiety disorder in his brother; Cancer in his father and another family member; Colon polyps in his sister; Coronary artery disease in an other family member; Emphysema in his father; Heart attack in his brother and mother; Heart disease in his maternal aunt; Heart failure in his father; Lymphoma in his sister.    Review of Systems: Review of Systems  Constitutional: Negative.        Weight gain  Respiratory: Positive for shortness of breath.   Cardiovascular: Negative.   Gastrointestinal: Negative.   Musculoskeletal: Negative.   Neurological: Negative.   Psychiatric/Behavioral: Negative.   All other systems reviewed and are negative.   PHYSICAL EXAM: VS:  BP 132/60 (BP Location: Left Arm, Patient  Position: Sitting, Cuff Size: Normal)   Pulse (!) 58   Ht 5\' 8"  (1.727 m)   Wt 212 lb 8 oz (96.4 kg)   BMI 32.31 kg/m  , BMI Body mass index is 32.31 kg/m. Constitutional:  oriented to person, place, and time. No distress. Obese HENT:  Head: Normocephalic and atraumatic.  Eyes:  no discharge. No scleral icterus.  Neck: Normal range of motion. Neck supple. No JVD present.  Cardiovascular: Normal rate, regular rhythm, normal heart sounds and intact distal pulses. Exam reveals no gallop and no friction rub.   Edema trace to 1+ right > left No murmur heard. Pulmonary/Chest: Effort normal and breath sounds normal. No stridor. No respiratory distress.  no wheezes.  no rales.  no tenderness.  Abdominal: Soft.  no distension.  no tenderness.  Musculoskeletal: Normal range of motion.  no  tenderness or deformity.  Neurological:  normal muscle tone. Coordination normal. No atrophy Skin: Skin is warm and dry. No rash noted. not diaphoretic.  Psychiatric:  normal mood and affect. behavior is normal. Thought content normal.    Recent Labs: No results found for requested labs within last 8760 hours.    Lipid Panel Lab Results  Component Value Date   CHOL 147 06/06/2013   HDL 37.90 (L) 06/06/2013   LDLCALC 61 06/06/2013   TRIG 243.0 (H) 06/06/2013      Wt Readings from Last 3 Encounters:  10/02/18 212 lb 8 oz (96.4 kg)  10/01/18 208 lb (94.3 kg)  10/04/17 217 lb 3.2 oz (98.5 kg)       ASSESSMENT AND PLAN:  Essential hypertension - Plan: EKG 12-Lead Recommend he continue current medications but check blood pressure 2 hours after he takes his morning medications Also was only checking blood pressure in the morning recommended he check blood pressures late morning sometimes afternoon sometimes evening to check to trend  Chronic systolic and diastolic CHF Long discussion about diagnosis of heart failure Ejection fraction mildly depressed 45% also with diastolic  dysfunction Requiring Lasix 20 twice daily to maintain weight He does have pitting leg edema worse on the right than the left though unclear if this is cardiac related as he does have bilateral knee replacements -Suggested he try Lasix 40 in the morning 20 after lunch Goal weight 205 pounds down from 207 pounds We will see if this helps his shortness of breath  Shortness of breath Likely multifactorial including obesity, deconditioning, fluid retention/pulmonary edema Suggested  extra Lasix, regular walking program, weight loss Ideally goal weight should be 180 pounds REDS VEST score 32% on today's visit  Atherosclerosis of native coronary artery with angina pectoris, unspecified whether native or transplanted heart ([place]) -  Ejection fraction 40-45%,  Previous stress test 2018 with ejection fraction 47% Suggested if he continues to have shortness of breath  on exertion that he call our office for stress testing  Carotid disease 40% disease on the left in 2015 Stressed importance of aggressive diet, weight loss, statin Cholesterol at goal Consider retesting on next visit  Hyperlipidemia Cholesterol is at goal on the current lipid regimen. No changes to the medications were made. Stable   Total encounter time more than 45 minutes  Greater than 50% was spent in counseling and coordination of care with the patient   Disposition:   F/U  6 month   Orders Placed This Encounter  Procedures  . EKG 12-Lead     Signed, Esmond Plants, M.D., Ph.D. 10/02/2018  [place], [place]

## 2018-10-01 ENCOUNTER — Ambulatory Visit (INDEPENDENT_AMBULATORY_CARE_PROVIDER_SITE_OTHER): Payer: PPO | Admitting: Urology

## 2018-10-01 ENCOUNTER — Encounter: Payer: Self-pay | Admitting: Urology

## 2018-10-01 ENCOUNTER — Other Ambulatory Visit: Payer: Self-pay

## 2018-10-01 VITALS — BP 120/66 | HR 62 | Ht 68.0 in | Wt 208.0 lb

## 2018-10-01 DIAGNOSIS — N401 Enlarged prostate with lower urinary tract symptoms: Secondary | ICD-10-CM

## 2018-10-01 DIAGNOSIS — Z87898 Personal history of other specified conditions: Secondary | ICD-10-CM | POA: Diagnosis not present

## 2018-10-01 DIAGNOSIS — R350 Frequency of micturition: Secondary | ICD-10-CM | POA: Insufficient documentation

## 2018-10-01 MED ORDER — FINASTERIDE 5 MG PO TABS: 5.0000 mg | ORAL_TABLET | Freq: Every day | ORAL | 3 refills | Status: DC

## 2018-10-01 NOTE — Progress Notes (Signed)
10/01/2018 9:24 AM   Kevin Choi February 11, 1941 [id number]  Referring provider: [name], MD [facility] [place],  [place] [address]  Chief Complaint  Patient presents with  . Benign Prostatic Hypertrophy    1year follow up    Urologic history:  1.  BPH with lower urinary tract symptoms  -On finasteride  -Mild to moderate residual  2.  History elevated PSA  -History of several biopsies between 2003-2010  -No records available; all benign per patient  -Baseline uncorrected PSA upper 1 range   HPI: 78 y.o. presents for annual follow-up.  He requested to continue PSA testing last year and his uncorrected PSA was above baseline at 2.5.  It was repeated February 2020 and was 1.4.  He remains on finasteride.  He does have urinary frequency, urgency with occasional episodes of urge incontinence.  He has nocturia x3.  Denies dysuria or gross hematuria.  He has no flank, abdominal, pelvic or scrotal pain.  PMH: Past Medical History:  Diagnosis Date  . Allergic rhinitis due to pollen   . CAD (coronary artery disease) 2002   bypass  . Chicken pox   . Diabetes ([place])   . Diabetes mellitus   . Elevated prostate specific antigen (PSA) 02/21/2001  . Headache(784.0)   . Hemiplegic migraine, without mention of intractable migraine without mention of status migrainosus   . HLD (hyperlipidemia)   . HTN (hypertension) 05/08/2007  . Hyperhidrosis   . Impotence of organic origin 07/24/2007  . Leg pain   . Measles   . Mumps   . Nausea with vomiting   . Neuropathy   . Occlusion and stenosis of carotid artery without mention of cerebral infarction 09/15/2006  . Peripheral neuropathy 01/31/2008  . Peripheral vascular disease ([place])   . Personal history of urinary calculi   . Pleural effusion 2007   with pneumothorax   . Restless legs syndrome (RLS) 01/10/2007  . Slurred speech   . TIA (transient ischemic attack)    Patient stated 2 months ago     Surgical History: Past Surgical History:  Procedure Laterality Date  . CAROTID ENDARTERECTOMY  2001   right  . CHOLECYSTECTOMY  2003  . collapse lung  2007  . CORONARY ARTERY BYPASS GRAFT  2001  . KNEE SURGERY Left 1981  . LUNG SURGERY  2007   Left; decortication left lung for persistant pleural effusion and PTX. admission ten days at [facility].  [name] PAROTID GLAND TUMOR EXCISION  1973  . VASECTOMY      Home Medications:  Allergies as of 10/01/2018      Reactions   Iodinated Diagnostic Agents Nausea Only   Hypotension Hypotension   Other Nausea Only, Other (See Comments)   Contrast Dye-drops blood pressure. Contrast Dye- hypotension and nausa and syncope      Medication List       Accurate as of October 01, 2018  9:24 AM. If you have any questions, ask your nurse or doctor.        STOP taking these medications   diphenoxylate-atropine 2.5-0.025 MG tablet Commonly known as: LOMOTIL Stopped by: Abbie Sons, MD     TAKE these medications   acetaminophen 500 MG tablet Commonly known as: TYLENOL Take 500 mg by mouth every 6 (six) hours as needed. Patient states he can take 2 tablets daily as needed for pain   atorvastatin 40 MG tablet Commonly known as: LIPITOR Take 40 mg by mouth daily.  cetirizine 10 MG tablet Commonly known as: ZYRTEC Take 10 mg by mouth as needed.   finasteride 5 MG tablet Commonly known as: PROSCAR Take 1 tablet (5 mg total) by mouth daily.   furosemide 20 MG tablet Commonly known as: LASIX TAKE 1 TABLET BY MOUTH TWICE A DAY AS NEEDED   gabapentin 300 MG capsule Commonly known as: NEURONTIN Take 300 mg by mouth 3 (three) times daily.   glucose blood test strip   hydrALAZINE 100 MG tablet Commonly known as: APRESOLINE Take 100 mg by mouth 2 (two) times daily.   ketoconazole 2 % cream Commonly known as: NIZORAL   magnesium gluconate 500 MG tablet Commonly known as: MAGONATE Take 500 mg by mouth daily.   metFORMIN 500 MG  tablet Commonly known as: GLUCOPHAGE Take 500 mg by mouth 2 (two) times daily with a meal.   metoprolol succinate 50 MG 24 hr tablet Commonly known as: TOPROL-XL Take 50 mg by mouth daily. Take with or immediately following a meal.   Multi-Vitamin tablet Take by mouth.   Multivital tablet Take 1 tablet by mouth daily.   Nitrostat 0.4 MG SL tablet Generic drug: nitroGLYCERIN Place 0.4 mg under the tongue every 5 (five) minutes as needed.   potassium chloride 10 MEQ tablet Commonly known as: K-DUR Take 1 tablet (10 mEq total) by mouth 2 (two) times daily.   pramipexole 0.25 MG tablet Commonly known as: Mirapex Take 1 tablet (0.25 mg total) by mouth 3 (three) times daily.   promethazine 12.5 MG tablet Commonly known as: PHENERGAN Take 1 tablet (12.5 mg total) by mouth every 8 (eight) hours as needed for nausea.   quinapril 20 MG tablet Commonly known as: ACCUPRIL TAKE 1 AND 1/2 TABLETS BY MOUTH DAILY   rivaroxaban 20 MG Tabs tablet Commonly known as: Xarelto Take 1 tablet (20 mg total) by mouth daily.   UNABLE TO FIND Take by mouth.       Allergies:  Allergies  Allergen Reactions  . Iodinated Diagnostic Agents Nausea Only    Hypotension Hypotension  . Other Nausea Only and Other (See Comments)    Contrast Dye-drops blood pressure. Contrast Dye- hypotension and nausa and syncope    Family History: Family History  Problem Relation Age of Onset  . Heart attack Mother   . Heart failure Father   . Angina Father   . Emphysema Father   . Cancer Father        prostate  . Lymphoma Sister        Non-Hodgkins  . Cancer Other   . Coronary artery disease Other        brother  . Heart attack Brother   . Heart disease Maternal Aunt   . Colon polyps Sister   . Anxiety disorder Brother     Social History:  reports that he has quit smoking. His smoking use included cigarettes. He has a 20.00 pack-year smoking history. He has never used smokeless tobacco. He  reports that he does not drink alcohol or use drugs.  ROS: UROLOGY Frequent Urination?: No Hard to postpone urination?: No Burning/pain with urination?: No Get up at night to urinate?: No Leakage of urine?: No Urine stream starts and stops?: No Trouble starting stream?: No Do you have to strain to urinate?: No Blood in urine?: No Urinary tract infection?: No Sexually transmitted disease?: No Injury to kidneys or bladder?: No Painful intercourse?: No Weak stream?: No Erection problems?: No Penile pain?: No  Gastrointestinal Nausea?: No  Vomiting?: No Indigestion/heartburn?: No Diarrhea?: No Constipation?: No  Constitutional Fever: No Night sweats?: No Weight loss?: No Fatigue?: No  Skin Skin rash/lesions?: No Itching?: No  Eyes Blurred vision?: No Double vision?: No  Ears/Nose/Throat Sore throat?: No Sinus problems?: No  Hematologic/Lymphatic Swollen glands?: No Easy bruising?: Yes  Cardiovascular Leg swelling?: No Chest pain?: No  Respiratory Cough?: No Shortness of breath?: Yes  Endocrine Excessive thirst?: No  Musculoskeletal Back pain?: Yes Joint pain?: No  Neurological Headaches?: No Dizziness?: No  Psychologic Depression?: No Anxiety?: No  Physical Exam: BP 120/66   Pulse 62   Ht 5\' 8"  (1.727 m)   Wt 208 lb (94.3 kg)   BMI 31.63 kg/m   Constitutional:  Alert and oriented, No acute distress. HEENT: [place] AT, moist mucus membranes.  Trachea midline, no masses. Cardiovascular: No clubbing, cyanosis, or edema. Respiratory: Normal respiratory effort, no increased work of breathing. Skin: No rashes, bruises or suspicious lesions. Neurologic: Grossly intact, no focal deficits, moving all 4 extremities. Psychiatric: Normal mood and affect.   Assessment & Plan:    - BPH with lower urinary tract symptoms Stable.  Additional medical management for his storage later voiding symptoms were discussed however he states his symptoms are not  bothersome enough that he desires additional medication.  I also reviewed prostate cancer screening guidelines and would recommend discontinue screening based on age and PSA stability.  He did have a PSA in February 2020 which was stable and will continue annual follow-up.  Finasteride was refilled   Abbie Sons, MD  [facility] [address], [place], [place] [address] [phone]

## 2018-10-02 ENCOUNTER — Ambulatory Visit (INDEPENDENT_AMBULATORY_CARE_PROVIDER_SITE_OTHER): Payer: PPO | Admitting: Cardiovascular Disease

## 2018-10-02 ENCOUNTER — Encounter: Payer: Self-pay | Admitting: Cardiovascular Disease

## 2018-10-02 VITALS — BP 132/60 | HR 58 | Ht 68.0 in | Wt 212.5 lb

## 2018-10-02 DIAGNOSIS — G459 Transient cerebral ischemic attack, unspecified: Secondary | ICD-10-CM | POA: Diagnosis not present

## 2018-10-02 DIAGNOSIS — E7800 Pure hypercholesterolemia, unspecified: Secondary | ICD-10-CM | POA: Diagnosis not present

## 2018-10-02 DIAGNOSIS — R0602 Shortness of breath: Secondary | ICD-10-CM

## 2018-10-02 DIAGNOSIS — I10 Essential (primary) hypertension: Secondary | ICD-10-CM

## 2018-10-02 DIAGNOSIS — I5033 Acute on chronic diastolic (congestive) heart failure: Secondary | ICD-10-CM

## 2018-10-02 DIAGNOSIS — I739 Peripheral vascular disease, unspecified: Secondary | ICD-10-CM

## 2018-10-02 DIAGNOSIS — I25118 Atherosclerotic heart disease of native coronary artery with other forms of angina pectoris: Secondary | ICD-10-CM | POA: Diagnosis not present

## 2018-10-02 DIAGNOSIS — E782 Mixed hyperlipidemia: Secondary | ICD-10-CM | POA: Diagnosis not present

## 2018-10-02 DIAGNOSIS — E1143 Type 2 diabetes mellitus with diabetic autonomic (poly)neuropathy: Secondary | ICD-10-CM

## 2018-10-02 DIAGNOSIS — I6523 Occlusion and stenosis of bilateral carotid arteries: Secondary | ICD-10-CM

## 2018-10-02 DIAGNOSIS — I5042 Chronic combined systolic (congestive) and diastolic (congestive) heart failure: Secondary | ICD-10-CM

## 2018-10-02 NOTE — Patient Instructions (Signed)
Medication Instructions:  Goal weight 205 to 206 Try extra lasix in additional to 20 twice a day  If you need a refill on your cardiac medications before your next appointment, please call your pharmacy.    Lab work: No new labs needed   If you have labs (blood work) drawn today and your tests are completely normal, you will receive your results only by: [name] MyChart Message (if you have MyChart) OR . A paper copy in the mail If you have any lab test that is abnormal or we need to change your treatment, we will call you to review the results.   Testing/Procedures: No new testing needed   Follow-Up: At [facility], you and your health needs are our priority.  As part of our continuing mission to provide you with exceptional heart care, we have created designated Provider Care Teams.  These Care Teams include your primary Cardiologist (physician) and Advanced Practice Providers (APPs -  Physician Assistants and Nurse Practitioners) who all work together to provide you with the care you need, when you need it.  . You will need a follow up appointment in 6 months  . Providers on your designated Care Team:   . Murray Hodgkins, NP . Christell Faith, PA-C . Marrianne Mood, PA-C  Any Other Special Instructions Will Be Listed Below (If Applicable).  For educational health videos Log in to : [email or web address] Or : [email or web address], password : [email or web address]

## 2018-10-09 DIAGNOSIS — E1143 Type 2 diabetes mellitus with diabetic autonomic (poly)neuropathy: Secondary | ICD-10-CM | POA: Diagnosis not present

## 2018-10-09 DIAGNOSIS — E7800 Pure hypercholesterolemia, unspecified: Secondary | ICD-10-CM | POA: Diagnosis not present

## 2018-10-09 DIAGNOSIS — I10 Essential (primary) hypertension: Secondary | ICD-10-CM | POA: Diagnosis not present

## 2018-10-09 DIAGNOSIS — Z6833 Body mass index (BMI) 33.0-33.9, adult: Secondary | ICD-10-CM | POA: Diagnosis not present

## 2018-10-09 DIAGNOSIS — E6609 Other obesity due to excess calories: Secondary | ICD-10-CM | POA: Diagnosis not present

## 2018-10-26 ENCOUNTER — Emergency Department: Payer: PPO

## 2018-10-26 ENCOUNTER — Emergency Department
Admission: EM | Admit: 2018-10-26 | Discharge: 2018-10-26 | Disposition: A | Discharge status: Home / Self Care | Payer: PPO | Attending: Emergency Medicine | Admitting: Emergency Medicine

## 2018-10-26 ENCOUNTER — Other Ambulatory Visit: Payer: Self-pay

## 2018-10-26 DIAGNOSIS — I2510 Atherosclerotic heart disease of native coronary artery without angina pectoris: Secondary | ICD-10-CM | POA: Insufficient documentation

## 2018-10-26 DIAGNOSIS — E785 Hyperlipidemia, unspecified: Secondary | ICD-10-CM | POA: Diagnosis not present

## 2018-10-26 DIAGNOSIS — E119 Type 2 diabetes mellitus without complications: Secondary | ICD-10-CM | POA: Diagnosis not present

## 2018-10-26 DIAGNOSIS — Z91041 Radiographic dye allergy status: Secondary | ICD-10-CM | POA: Diagnosis not present

## 2018-10-26 DIAGNOSIS — R52 Pain, unspecified: Secondary | ICD-10-CM | POA: Diagnosis not present

## 2018-10-26 DIAGNOSIS — Z8673 Personal history of transient ischemic attack (TIA), and cerebral infarction without residual deficits: Secondary | ICD-10-CM | POA: Insufficient documentation

## 2018-10-26 DIAGNOSIS — Z7984 Long term (current) use of oral hypoglycemic drugs: Secondary | ICD-10-CM | POA: Diagnosis not present

## 2018-10-26 DIAGNOSIS — I5033 Acute on chronic diastolic (congestive) heart failure: Secondary | ICD-10-CM | POA: Insufficient documentation

## 2018-10-26 DIAGNOSIS — Z87891 Personal history of nicotine dependence: Secondary | ICD-10-CM | POA: Diagnosis not present

## 2018-10-26 DIAGNOSIS — I110 Hypertensive heart disease with heart failure: Secondary | ICD-10-CM | POA: Diagnosis not present

## 2018-10-26 DIAGNOSIS — Z79899 Other long term (current) drug therapy: Secondary | ICD-10-CM | POA: Insufficient documentation

## 2018-10-26 DIAGNOSIS — M25551 Pain in right hip: Secondary | ICD-10-CM | POA: Diagnosis not present

## 2018-10-26 DIAGNOSIS — Z7901 Long term (current) use of anticoagulants: Secondary | ICD-10-CM | POA: Insufficient documentation

## 2018-10-26 MED ORDER — PREDNISONE 10 MG PO TABS: ORAL_TABLET | ORAL | 0 refills | Status: DC

## 2018-10-26 MED ORDER — TRAMADOL HCL 50 MG PO TABS: 50.0000 mg | ORAL_TABLET | Freq: Four times a day (QID) | ORAL | 0 refills | Status: DC | PRN

## 2018-10-26 MED ORDER — OXYCODONE-ACETAMINOPHEN 5-325 MG PO TABS
1.0000 | ORAL_TABLET | Freq: Once | ORAL | Status: AC
  Administered 2018-10-26: 1 via ORAL
  Filled 2018-10-26: qty 1

## 2018-10-26 NOTE — ED Notes (Signed)
See triage note  Presents with pain to right hip  States he developed pain about 3 days ago  States pain became worse yesterday  Unable to bear wt  Denies fall or trauma

## 2018-10-26 NOTE — ED Provider Notes (Signed)
[facility] Emergency Department Provider Note   ____________________________________________   First MD Initiated Contact with Patient 10/26/18 1302     (approximate)  I have reviewed the triage vital signs and the nursing notes.   HISTORY  Chief Complaint Hip Pain  HPI Kevin Choi is a 78 y.o. male presents to the ED with complaint of right hip pain for the last 3 days without history of injury.  Patient states that pain radiates down his right leg and is worse with weightbearing.  Patient also relates that certain movements increases his pain.  He denies any pain when sitting or lying still.  Patient has taken Tylenol with the last dose being early this morning.  He rates his pain as a 10/10.      Past Medical History:  Diagnosis Date  . Allergic rhinitis due to pollen   . CAD (coronary artery disease) 2002   bypass  . Chicken pox   . Diabetes ([place])   . Diabetes mellitus   . Elevated prostate specific antigen (PSA) 02/21/2001  . Headache(784.0)   . Hemiplegic migraine, without mention of intractable migraine without mention of status migrainosus   . HLD (hyperlipidemia)   . HTN (hypertension) 05/08/2007  . Hyperhidrosis   . Impotence of organic origin 07/24/2007  . Leg pain   . Measles   . Mumps   . Nausea with vomiting   . Neuropathy   . Occlusion and stenosis of carotid artery without mention of cerebral infarction 09/15/2006  . Peripheral neuropathy 01/31/2008  . Peripheral vascular disease ([place])   . Personal history of urinary calculi   . Pleural effusion 2007   with pneumothorax   . Restless legs syndrome (RLS) 01/10/2007  . Slurred speech   . TIA (transient ischemic attack)    Patient stated 2 months ago    Patient Active Problem List   Diagnosis Date Noted  . Benign prostatic hyperplasia with urinary frequency 10/01/2018  . History of elevated PSA 10/01/2018  . Pain due to onychomycosis of toenails of both feet 08/23/2018   . Pain due to onychomycosis of toenail of right foot 08/23/2018  . Class 1 obesity due to excess calories with serious comorbidity and body mass index (BMI) of 32.0 to 32.9 in adult 11/23/2016  . Pure hypercholesterolemia 11/23/2016  . Meningioma ([place]) 08/11/2016  . Vaccine counseling 04/05/2016  . Acute on chronic diastolic CHF (congestive heart failure) ([place]) 05/18/2015  . Bilateral carotid artery stenosis 05/20/2014  . Medicare annual wellness visit, subsequent 10/31/2013  . Medication management 03/07/2013  . Routine general medical examination at a health care facility 10/09/2012  . Hypersomnolence 06/18/2012  . Complicated migraine [id number]  . Need for prophylactic vaccination and inoculation against influenza 12/15/2011  . TIA (transient ischemic attack) 02/11/2011  . Type 2 diabetes mellitus with diabetic autonomic neuropathy, without long-term current use of insulin ([place]) 06/05/2009  . Hyperlipidemia 06/05/2009  . Essential hypertension 06/05/2009  . Coronary artery disease of native artery of native heart with stable angina pectoris ([place]) 06/05/2009  . PLEURAL EFFUSION 06/05/2009    Past Surgical History:  Procedure Laterality Date  . CAROTID ENDARTERECTOMY  2001   right  . CHOLECYSTECTOMY  2003  . collapse lung  2007  . CORONARY ARTERY BYPASS GRAFT  2001  . KNEE SURGERY Left 1981  . LUNG SURGERY  2007   Left; decortication left lung for persistant pleural effusion and PTX. admission ten days at [facility].  [name] PAROTID GLAND  TUMOR EXCISION  1973  . VASECTOMY      Prior to Admission medications   Medication Sig Start Date End Date Taking? Authorizing Provider  acetaminophen (TYLENOL) 500 MG tablet Take 500 mg by mouth every 6 (six) hours as needed. Patient states he can take 2 tablets daily as needed for pain    [provider]  atorvastatin (LIPITOR) 40 MG tablet Take 40 mg by mouth daily.    [provider]  cetirizine (ZYRTEC) 10 MG tablet Take 10 mg  by mouth as needed.     [provider]  finasteride (PROSCAR) 5 MG tablet Take 1 tablet (5 mg total) by mouth daily. 10/01/18   Stoioff, Ronda Fairly, MD  furosemide (LASIX) 20 MG tablet TAKE 1 TABLET BY MOUTH TWICE A DAY AS NEEDED 08/31/18   Minna Merritts, MD  gabapentin (NEURONTIN) 300 MG capsule Take 300 mg by mouth 3 (three) times daily.    [provider]  glucose blood test strip  06/15/18   [provider]  hydrALAZINE (APRESOLINE) 100 MG tablet Take 100 mg by mouth 2 (two) times daily.    [provider]  ketoconazole (NIZORAL) 2 % cream  08/17/16   [provider]  magnesium gluconate (MAGONATE) 500 MG tablet Take 500 mg by mouth daily.    [provider]  metFORMIN (GLUCOPHAGE) 500 MG tablet Take 500 mg by mouth 2 (two) times daily with a meal.    [provider]  metoprolol succinate (TOPROL-XL) 50 MG 24 hr tablet Take 50 mg by mouth daily. Take with or immediately following a meal.    [provider]  Multiple Vitamin (MULTI-VITAMIN) tablet Take by mouth.    [provider]  Multiple Vitamins-Minerals (MULTIVITAL) tablet Take 1 tablet by mouth daily.      [provider]  nitroGLYCERIN (NITROSTAT) 0.4 MG SL tablet Place 0.4 mg under the tongue every 5 (five) minutes as needed.      [provider]  potassium chloride (K-DUR) 10 MEQ tablet Take 1 tablet (10 mEq total) by mouth 2 (two) times daily. 06/20/17 09/18/17  Minna Merritts, MD  pramipexole (MIRAPEX) 0.25 MG tablet Take 1 tablet (0.25 mg total) by mouth 3 (three) times daily. 03/07/13   Rey, Latina Craver, NP  predniSONE (DELTASONE) 10 MG tablet Take 3 tablets once a day until finished. 10/26/18   Johnn Hai, PA-C  promethazine (PHENERGAN) 12.5 MG tablet Take 1 tablet (12.5 mg total) by mouth every 8 (eight) hours as needed for nausea. 03/07/13   Rey, Latina Craver, NP  quinapril (ACCUPRIL) 20 MG tablet TAKE 1 AND 1/2 TABLETS BY MOUTH DAILY  08/31/18   Minna Merritts, MD  Rivaroxaban (XARELTO) 20 MG TABS tablet Take 1 tablet (20 mg total) by mouth daily. 03/07/13   Rey, Latina Craver, NP  traMADol (ULTRAM) 50 MG tablet Take 1 tablet (50 mg total) by mouth every 6 (six) hours as needed. 10/26/18   Letitia Neri L, PA-C  UNABLE TO FIND Take by mouth.    [provider]    Allergies Iodinated diagnostic agents and Other  Family History  Problem Relation Age of Onset  . Heart attack Mother   . Heart failure Father   . Angina Father   . Emphysema Father   . Cancer Father        prostate  . Lymphoma Sister        Non-Hodgkins  . Cancer Other   .  Coronary artery disease Other        brother  . Heart attack Brother   . Heart disease Maternal Aunt   . Colon polyps Sister   . Anxiety disorder Brother     Social History Social History   Tobacco Use  . Smoking status: Former Smoker    Packs/day: 2.00    Years: 10.00    Pack years: 20.00    Types: Cigarettes  . Smokeless tobacco: Never Used  Substance Use Topics  . Alcohol use: No  . Drug use: No    Review of Systems Constitutional: No fever/chills Cardiovascular: Denies chest pain. Respiratory: Denies shortness of breath. Gastrointestinal: No abdominal pain.  No nausea, no vomiting. Musculoskeletal: Positive for right hip pain. Skin: Negative for rash. Neurological: Negative for headaches, focal weakness or numbness. ___________________________________________   PHYSICAL EXAM:  VITAL SIGNS: ED Triage Vitals [10/26/18 1239]  Enc Vitals Group     BP (!) 116/55     Pulse Rate 63     Resp 18     Temp 98 F (36.7 C)     Temp Source Oral     SpO2 98 %     Weight 207 lb (93.9 kg)     Height 5\' 8"  (1.727 m)     Head Circumference      Peak Flow      Pain Score 10     Pain Loc      Pain Edu?      Excl. in [place]?    Constitutional: Alert and oriented. Well appearing and in no acute distress. Eyes: Conjunctivae are normal.  Head: Atraumatic. Nose:  No congestion/rhinnorhea. Mouth/Throat: Mucous membranes are moist.  Oropharynx non-erythematous. Neck: No stridor.   Cardiovascular: Normal rate, regular rhythm. Grossly normal heart sounds.  Good peripheral circulation. Respiratory: Normal respiratory effort.  No retractions. Lungs CTAB. Musculoskeletal: Examination of the right hip there is no gross deformity however with range of motion there is increased pain.  On palpation there is some moderate soft tissue tenderness posterior lateral aspect.  No soft tissue injury or discoloration is noted.  No edema. Neurologic:  Normal speech and language. No gross focal neurologic deficits are appreciated.  Skin:  Skin is warm, dry and intact. No rash noted. Psychiatric: Mood and affect are normal. Speech and behavior are normal.  ____________________________________________   LABS (all labs ordered are listed, but only abnormal results are displayed)  Labs Reviewed - No data to display RADIOLOGY   Official radiology report(s): Dg Hip Unilat W Or Wo Pelvis 2-3 Views Right  Result Date: 10/26/2018 CLINICAL DATA:  Right hip pain.  No fall. EXAM: DG HIP (WITH OR WITHOUT PELVIS) 2-3V RIGHT COMPARISON:  04/27/2011 FINDINGS: Femoral heads are located. Joint spaces are maintained for age. Sacroiliac joints are symmetric. Degenerate disc disease at the lumbosacral junction, suboptimally evaluated. No acute fracture. IMPRESSION: No acute osseous abnormality. Electronically Signed   By: Abigail Miyamoto M.D.   On: 10/26/2018 14:02    ____________________________________________   PROCEDURES  Procedure(s) performed (including Critical Care):  Procedures   ____________________________________________   INITIAL IMPRESSION / ASSESSMENT AND PLAN / ED COURSE  As part of my medical decision making, I reviewed the following data within the electronic MEDICAL RECORD NUMBER Notes from prior ED visits and [place] Controlled Substance Database  78 year old male  presents to the ED with complaint of right hip pain for last 3 days without history of injury.  Physical exam showed moderate tenderness on palpation  posterior laterally and increased pain with weightbearing.  X-ray was negative for any acute bony injury.  Patient was given Percocet while in the ED and was getting relief prior to discharge.  We discussed his diabetes and prednisone and his blood sugar usually runs below 140 unless he eats popcorn at night.  He will be mindful of what he is eating while taking the prednisone and a prescription for tramadol was given to him for pain as needed.  He is to follow-up with his PCP, Dr. Marry Guan or return to the emergency department if any severe worsening of his symptoms over the weekend.  ____________________________________________   FINAL CLINICAL IMPRESSION(S) / ED DIAGNOSES  Final diagnoses:  Acute pain of right hip     ED Discharge Orders         Ordered    predniSONE (DELTASONE) 10 MG tablet     10/26/18 1442    traMADol (ULTRAM) 50 MG tablet  Every 6 hours PRN     10/26/18 1442           Note:  This document was prepared using Dragon voice recognition software and may include unintentional dictation errors.    Johnn Hai, PA-C 10/26/18 1715    Earleen Newport, MD 10/31/18 [phone]

## 2018-10-26 NOTE — Discharge Instructions (Addendum)
Follow-up with your primary care provider or Dr. Marry Guan for your right hip pain. Begin taking tramadol as needed for pain.  This medication could cause drowsiness and increase your risk for falling.  Also prednisone was sent to your pharmacy take 3 tablets once a day until finished.  This is the medication that I discussed with you that may cause your blood sugars to elevate temporarily.  Be mindful of what you are eating during the time that you are taking the prednisone.

## 2018-10-26 NOTE — ED Triage Notes (Signed)
To ER c/o right hip pain with no injury X 3 days and progressing over last 3 days. Radiates down right leg. Worse with standing and weight bearing. Pt alert and oriented X4, cooperative, RR even and unlabored, color WNL. Pt in NAD. Denies urinary sx. VSS with EMS

## 2018-10-30 DIAGNOSIS — M25551 Pain in right hip: Secondary | ICD-10-CM | POA: Diagnosis not present

## 2018-11-06 DIAGNOSIS — M545 Low back pain: Secondary | ICD-10-CM | POA: Diagnosis not present

## 2018-11-06 DIAGNOSIS — M25551 Pain in right hip: Secondary | ICD-10-CM | POA: Diagnosis not present

## 2018-12-05 DIAGNOSIS — L821 Other seborrheic keratosis: Secondary | ICD-10-CM | POA: Diagnosis not present

## 2018-12-05 DIAGNOSIS — D225 Melanocytic nevi of trunk: Secondary | ICD-10-CM | POA: Diagnosis not present

## 2018-12-05 DIAGNOSIS — D1800 Hemangioma unspecified site: Secondary | ICD-10-CM | POA: Diagnosis not present

## 2018-12-05 DIAGNOSIS — L219 Seborrheic dermatitis, unspecified: Secondary | ICD-10-CM | POA: Diagnosis not present

## 2018-12-05 DIAGNOSIS — D2371 Other benign neoplasm of skin of right lower limb, including hip: Secondary | ICD-10-CM | POA: Diagnosis not present

## 2018-12-05 DIAGNOSIS — D485 Neoplasm of uncertain behavior of skin: Secondary | ICD-10-CM | POA: Diagnosis not present

## 2018-12-05 DIAGNOSIS — L814 Other melanin hyperpigmentation: Secondary | ICD-10-CM | POA: Diagnosis not present

## 2018-12-05 DIAGNOSIS — L570 Actinic keratosis: Secondary | ICD-10-CM | POA: Diagnosis not present

## 2018-12-05 DIAGNOSIS — Z1283 Encounter for screening for malignant neoplasm of skin: Secondary | ICD-10-CM | POA: Diagnosis not present

## 2018-12-05 DIAGNOSIS — L720 Epidermal cyst: Secondary | ICD-10-CM | POA: Diagnosis not present

## 2018-12-08 ENCOUNTER — Other Ambulatory Visit: Payer: Self-pay | Admitting: Cardiovascular Disease

## 2018-12-26 ENCOUNTER — Other Ambulatory Visit: Payer: Self-pay | Admitting: Cardiovascular Disease

## 2018-12-27 ENCOUNTER — Ambulatory Visit: Payer: PPO | Admitting: Podiatry

## 2018-12-27 DIAGNOSIS — L820 Inflamed seborrheic keratosis: Secondary | ICD-10-CM | POA: Diagnosis not present

## 2018-12-27 DIAGNOSIS — D485 Neoplasm of uncertain behavior of skin: Secondary | ICD-10-CM | POA: Diagnosis not present

## 2018-12-27 DIAGNOSIS — L570 Actinic keratosis: Secondary | ICD-10-CM | POA: Diagnosis not present

## 2018-12-27 DIAGNOSIS — D225 Melanocytic nevi of trunk: Secondary | ICD-10-CM | POA: Diagnosis not present

## 2019-01-04 ENCOUNTER — Other Ambulatory Visit: Payer: Self-pay | Admitting: *Deleted

## 2019-01-04 DIAGNOSIS — Z20822 Contact with and (suspected) exposure to covid-19: Secondary | ICD-10-CM

## 2019-01-07 LAB — NOVEL CORONAVIRUS, NAA: SARS-CoV-2, NAA: NOT DETECTED

## 2019-02-05 DIAGNOSIS — D329 Benign neoplasm of meninges, unspecified: Secondary | ICD-10-CM | POA: Diagnosis not present

## 2019-02-05 DIAGNOSIS — D333 Benign neoplasm of cranial nerves: Secondary | ICD-10-CM | POA: Diagnosis not present

## 2019-03-16 ENCOUNTER — Other Ambulatory Visit: Payer: Self-pay | Admitting: Cardiovascular Disease

## 2019-04-04 DIAGNOSIS — L578 Other skin changes due to chronic exposure to nonionizing radiation: Secondary | ICD-10-CM | POA: Diagnosis not present

## 2019-04-04 DIAGNOSIS — L570 Actinic keratosis: Secondary | ICD-10-CM | POA: Diagnosis not present

## 2019-04-04 DIAGNOSIS — L821 Other seborrheic keratosis: Secondary | ICD-10-CM | POA: Diagnosis not present

## 2019-04-04 DIAGNOSIS — E1143 Type 2 diabetes mellitus with diabetic autonomic (poly)neuropathy: Secondary | ICD-10-CM | POA: Diagnosis not present

## 2019-04-04 DIAGNOSIS — I10 Essential (primary) hypertension: Secondary | ICD-10-CM | POA: Diagnosis not present

## 2019-04-04 DIAGNOSIS — E7800 Pure hypercholesterolemia, unspecified: Secondary | ICD-10-CM | POA: Diagnosis not present

## 2019-04-11 ENCOUNTER — Telehealth: Payer: Self-pay | Admitting: Cardiovascular Disease

## 2019-04-11 DIAGNOSIS — G459 Transient cerebral ischemic attack, unspecified: Secondary | ICD-10-CM

## 2019-04-11 NOTE — Telephone Encounter (Signed)
Spoke with the patient. Patient sts that he has a hx of TIA's he has not had one in couple of years. His previous episodes he would have just slurred speech. This episode included left arm numbness and numbness of the left side of his face. Patient sts that the episode lasted 30-45 mins. He did not seek medical attention.   He is taking Xarelto 20 mg qd. He is currently asymptomatic. Adv the patient that I will send an update to Dr. Sherrill Raring to get his recommendation. Advised the patient that he is to call 911 and seek emergency care for any reoccurrence of stroke like symptoms. Patient is agreeable with the plan and verbalized understanding.

## 2019-04-11 NOTE — Telephone Encounter (Signed)
Review of notes indicates multiple TIAs dating back 7 or 8 years Would recommend we update his imaging, Overdue for carotid ultrasound Hesitant to add aspirin or Plavix to his Xarelto given increased bleeding risk but this could be done for recurrent symptoms

## 2019-04-11 NOTE — Telephone Encounter (Signed)
Patient thinks he had a TIA on Tuesday . States he didn't have full function in his left hand, states then his hand "started not working right" and then it went up to his arm and his lip started going numb and into left cheek. St ates his speech was slurred also. States this lasted about 30 -45 minutes. States he has not had any symptoms since then. p

## 2019-04-12 NOTE — Telephone Encounter (Signed)
Spoke with the patient and made him aware of Dr. Donivan Scull response and recommendation. Patient is agreeable with having the Carotid dopp. He is also due for f/u with Dr. Rockey Situ. Advised the patient that a scheduler will contact him schedule the carotids and o/v.  Patient sts that he is doing well today and has not had any reoccurrence of stroke like symptoms. Reiterated that he should seek emergency care if they reoccur.  Patient verbalized understanding and voiced appreciation for the call.

## 2019-04-16 DIAGNOSIS — G253 Myoclonus: Secondary | ICD-10-CM | POA: Insufficient documentation

## 2019-05-03 ENCOUNTER — Telehealth: Payer: Self-pay | Admitting: Cardiovascular Disease

## 2019-05-03 NOTE — Telephone Encounter (Signed)
lmov to reschedule   °

## 2019-05-06 DIAGNOSIS — E119 Type 2 diabetes mellitus without complications: Secondary | ICD-10-CM | POA: Diagnosis not present

## 2019-05-08 ENCOUNTER — Ambulatory Visit: Payer: PPO | Admitting: Cardiovascular Disease

## 2019-05-22 ENCOUNTER — Telehealth: Payer: Self-pay | Admitting: Cardiovascular Disease

## 2019-05-22 DIAGNOSIS — Z01818 Encounter for other preprocedural examination: Secondary | ICD-10-CM | POA: Diagnosis not present

## 2019-05-22 DIAGNOSIS — H2511 Age-related nuclear cataract, right eye: Secondary | ICD-10-CM | POA: Diagnosis not present

## 2019-05-22 NOTE — Telephone Encounter (Signed)
   Primary Cardiologist: Dr.Gollan  Chart reviewed as part of pre-operative protocol coverage. Given past medical history and time since last visit, based on ACC/AHA guidelines, SALEM LASKOSKI would be at acceptable risk for the planned procedure without further cardiovascular testing. Cataract surgery has minimal risk for bleeding, and therefore we do no hold anticoagulation in this setting.   He has not been seen in the office since Sept 2020. We will make appt for him but he can still proceed with cataract surgery.   I will route this recommendation to the requesting party via Epic fax function and remove from pre-op pool.  Please call with questions.  Phill Myron. [place] Pugh, ANP, AACC  05/22/2019, 2:31 PM

## 2019-05-22 NOTE — Telephone Encounter (Signed)
   [facility] Pre-operative Risk Assessment    Request for surgical clearance:  1. What type of surgery is being performed? Cataract Extraction by PE, IOL   2. When is this surgery scheduled? 06/03/19  3. What type of clearance is required (medical clearance vs. Pharmacy clearance to hold med vs. Both)? Medical   4. Are there any medications that need to be held prior to surgery and how long? None listed    5. Practice name and name of physician performing surgery? [place], Dr Tama High   6. What is your office phone number [phone] ex [phone]   7.   What is your office fax number [phone]  8.   Anesthesia type (None, local, MAC, general) ? IV sedation    Kevin Choi 05/22/2019, 1:48 PM  _________________________________________________________________   (provider comments below)

## 2019-06-03 DIAGNOSIS — H2511 Age-related nuclear cataract, right eye: Secondary | ICD-10-CM | POA: Diagnosis not present

## 2019-06-03 DIAGNOSIS — H25811 Combined forms of age-related cataract, right eye: Secondary | ICD-10-CM | POA: Diagnosis not present

## 2019-06-03 HISTORY — PX: CATHETER REMOVAL: SHX911

## 2019-06-06 ENCOUNTER — Ambulatory Visit (INDEPENDENT_AMBULATORY_CARE_PROVIDER_SITE_OTHER): Payer: PPO

## 2019-06-06 ENCOUNTER — Other Ambulatory Visit: Payer: Self-pay | Admitting: Cardiovascular Disease

## 2019-06-06 ENCOUNTER — Other Ambulatory Visit: Payer: Self-pay

## 2019-06-06 DIAGNOSIS — G459 Transient cerebral ischemic attack, unspecified: Secondary | ICD-10-CM

## 2019-06-08 NOTE — Progress Notes (Signed)
Cardiology Office Note  Date:  06/10/2019   ID:  Kevin Choi, DOB [birth year], MRN [id number]  PCP:  [name], MD   Chief Complaint  Patient presents with  . office visit    Pt concerned w/ Lasix/ swelling in legs, ankles and feet. Meds verbally reviewed w/ pt.    HPI:  79 year old gentleman with a history of  coronary artery disease,  bypass surgery with a LIMA to the LAD in 2002,  vein graft to the diagonal,  diabetes,  hypertension,  hyperlipidemia,  pleural effusion requiring a VATS,  carotid endarterectomy on the right in 2001,  TIA symptoms 3 times, once in 2002, October 2012 in March 2013. Status post bilateral total knee replacements 40% left carotid arterial disease resection of right frontal meningioma. who presents for routine followup of his coronary artery disease ,  leg swelling and shortness of breath.  On today's visit numerous issues to discuss Numb left arm, 03/2019 Left hands fingers not moving well going through folders Numbness got worse Left lip numb Lasted 45 min, then resolved without intervention No further episodes, etiology unclear  Previous MRI/MRA from 2019 reviewed Meningioma noted, chronic small vessel disease noted  05/2019 Carotid u/s, results reviewed left ICA are consistent with a 40-59% stenosis. Right is open, s/p CEA  Echo 2019, reviewed - Left ventricle: The cavity size was normal. There was moderate  concentric hypertrophy. Systolic function was mildly to  moderately reduced. The estimated ejection fraction was in the  range of 40% to 45%.   Weight stable Taking lasix 40 TID Trace edema above sock line  Up muiltiple times overnight, takes his Lasix 40 sometimes before bed  coffee x 2, ice tea/soda Water Eats out  Lab work reviewed Total chol 136, LDL 50 HBA1C 7.2 CR 1.1  recently active biking at [place], several miles No SOB or chest pain Not exercising at gym   EKG personally reviewed by  myself on todays visit Shows normal sinus rhythm rate 68 bpm left bundle branch block  Other past medical history reviewed Previous hearing problem,  led to MRI of the brain Discovery of mass right side of the brain Evaluated at [facility] found to have meningioma Had resection  04/21/2017 surgery resection     May 14 2011  he had acute onset of slurred speech. He was playing cards shortly after and he was unable to do any math. Symptoms lasted for approximately one to 2 hours. Blood pressure prior to this event had been well controlled though during and after the event, he had severe hypertension with systolic pressures sometimes greater than 200. He was in the emergency room for 2 days with extensive testing.  carotid ultrasound that showed no hemodynamically significant stenosis Head CT scan showing no acute stroke MRI of the head showing no infarction. Stable appearing area of enhancement of the right temporal lobe most compatible with meningioma Chest x-ray was essentially normal  Stress test April 2009 shows no significant ischemia. This was a pharmacologic study.   PMH:   has a past medical history of Allergic rhinitis due to pollen, CAD (coronary artery disease) (2002), Chicken pox, Diabetes ([place]), Diabetes mellitus, Elevated prostate specific antigen (PSA) (02/21/2001), Headache(784.0), Hemiplegic migraine, without mention of intractable migraine without mention of status migrainosus, HLD (hyperlipidemia), HTN (hypertension) (05/08/2007), Hyperhidrosis, Impotence of organic origin (07/24/2007), Leg pain, Measles, Mumps, Nausea with vomiting, Neuropathy, Occlusion and stenosis of carotid artery without mention of cerebral infarction (09/15/2006), Peripheral neuropathy (01/31/2008), Peripheral  vascular disease ([place]), Personal history of urinary calculi, Pleural effusion (2007), Restless legs syndrome (RLS) (01/10/2007), Slurred speech, and TIA (transient ischemic attack).  PSH:     Past Surgical History:  Procedure Laterality Date  . CAROTID ENDARTERECTOMY  2001   right  . CATHETER REMOVAL Right 06/03/2019  . CHOLECYSTECTOMY  2003  . collapse lung  2007  . CORONARY ARTERY BYPASS GRAFT  2001  . KNEE SURGERY Left 1981  . LUNG SURGERY  2007   Left; decortication left lung for persistant pleural effusion and PTX. [facility] at [facility].  [name] PAROTID GLAND TUMOR EXCISION  1973  . VASECTOMY      Current Outpatient Medications  Medication Sig Dispense Refill  . acetaminophen (TYLENOL) 500 MG tablet Take 500 mg by mouth every 6 (six) hours as needed. Patient states he can take 2 tablets daily as needed for pain    . atorvastatin (LIPITOR) 40 MG tablet Take 40 mg by mouth daily.    . cetirizine (ZYRTEC) 10 MG tablet Take 10 mg by mouth as needed.     . finasteride (PROSCAR) 5 MG tablet Take 1 tablet (5 mg total) by mouth daily. 90 tablet 3  . furosemide (LASIX) 20 MG tablet TAKE 1 TABLET BY MOUTH TWICE (2) DAILY AS NEEDED 270 tablet 0  . gabapentin (NEURONTIN) 300 MG capsule Take 300 mg by mouth 3 (three) times daily.    [name] glucose blood test strip     . hydrALAZINE (APRESOLINE) 100 MG tablet Take 100 mg by mouth 3 (three) times daily.     [name] ketoconazole (NIZORAL) 2 % cream     . magnesium gluconate (MAGONATE) 500 MG tablet Take 500 mg by mouth daily.    . metFORMIN (GLUCOPHAGE) 500 MG tablet Take 500 mg by mouth 2 (two) times daily with a meal.    . metoprolol succinate (TOPROL-XL) 50 MG 24 hr tablet Take 50 mg by mouth daily. Take with or immediately following a meal.    . Multiple Vitamin (MULTI-VITAMIN) tablet Take by mouth.    . nitroGLYCERIN (NITROSTAT) 0.4 MG SL tablet Place 0.4 mg under the tongue every 5 (five) minutes as needed.      . pramipexole (MIRAPEX) 0.25 MG tablet Take 1 tablet (0.25 mg total) by mouth 3 (three) times daily. 270 tablet 3  . promethazine (PHENERGAN) 12.5 MG tablet Take 1 tablet (12.5 mg total) by mouth every 8 (eight) hours as needed  for nausea. 20 tablet 0  . quinapril (ACCUPRIL) 20 MG tablet TAKE 1 AND 1/2 TABLETS BY MOUTH DAILY 135 tablet 0  . Rivaroxaban (XARELTO) 20 MG TABS tablet Take 1 tablet (20 mg total) by mouth daily. 90 tablet 3  . traMADol (ULTRAM) 50 MG tablet Take 1 tablet (50 mg total) by mouth every 6 (six) hours as needed. 15 tablet 0  . UNABLE TO FIND Take by mouth.     No current facility-administered medications for this visit.     Allergies:   Iodinated diagnostic agents and Other   Social History:  The patient  reports that he has quit smoking. His smoking use included cigarettes. He has a 20.00 pack-year smoking history. He has never used smokeless tobacco. He reports that he does not drink alcohol or use drugs.   Family History:   family history includes Angina in his father; Anxiety disorder in his brother; Cancer in his father and another family member; Colon polyps in his sister; Coronary artery disease in an  other family member; Emphysema in his father; Heart attack in his brother and mother; Heart disease in his maternal aunt; Heart failure in his father; Lymphoma in his sister.    Review of Systems: Review of Systems  Constitutional: Negative.   HENT: Negative.   Respiratory: Negative.   Cardiovascular: Negative.   Gastrointestinal: Negative.   Musculoskeletal: Negative.   Neurological: Negative.        Left arm numbness, numbness left lip  Psychiatric/Behavioral: Negative.   All other systems reviewed and are negative.   PHYSICAL EXAM: VS:  BP (!) 110/52 (BP Location: Left Arm, Patient Position: Sitting, Cuff Size: Normal)   Pulse 68   Ht 5' 8.5" (1.74 m)   Wt 210 lb (95.3 kg)   SpO2 97%   BMI 31.47 kg/m  , BMI Body mass index is 31.47 kg/m. Constitutional:  oriented to person, place, and time. No distress.  HENT:  Head: Grossly normal Eyes:  no discharge. No scleral icterus.  Neck: No JVD, no carotid bruits  Cardiovascular: Regular rate and rhythm, no murmurs  appreciated Trace edema above the sock line Pulmonary/Chest: Clear to auscultation bilaterally, no wheezes or rails Abdominal: Soft.  no distension.  no tenderness.  Musculoskeletal: Normal range of motion Neurological:  normal muscle tone. Coordination normal. No atrophy Skin: Skin warm and dry Psychiatric: normal affect, pleasant   Recent Labs: No results found for requested labs within last 8760 hours.    Lipid Panel Lab Results  Component Value Date   CHOL 147 06/06/2013   HDL 37.90 (L) 06/06/2013   LDLCALC 61 06/06/2013   TRIG 243.0 (H) 06/06/2013      Wt Readings from Last 3 Encounters:  06/10/19 210 lb (95.3 kg)  10/26/18 207 lb (93.9 kg)  10/02/18 212 lb 8 oz (96.4 kg)      ASSESSMENT AND PLAN:  Essential hypertension - Plan: EKG 12-Lead Blood pressure is well controlled on today's visit. No changes made to the medications.  Chronic systolic and diastolic CHF Weight stable, taking Lasix on his own 40 3 times daily Creatinine stable Recommended he take potassium daily he prefers a banana High fluid intake, previously eating out  Shortness of breath Likely multifactorial including obesity, deconditioning, fluid retention/pulmonary edema Biking recently, Recommend he continue on his exercise program  Atherosclerosis of native coronary artery with stable angina Ejection fraction 40-45%,  Previous stress test 2018 with ejection fraction 47% No further testing at this time  Carotid disease 40% to 50% disease on the left  Right side less than 39, results stable over the past 5 to 6 years  Numbness left arm Etiology unclear, concerning for TIA He will discuss with neurosurgery whether head MRI needed, he had one several months ago with interval increase of meningioma He does have chronic small vessel disease  Hyperlipidemia Numbers at goal, no medication changes made  Diabetes type 2 with complications We have encouraged continued exercise, careful  diet management in an effort to lose weight.   Total encounter time more than 45 minutes  Greater than 50% was spent in counseling and coordination of care with the patient   Disposition:   F/U  6 month   Orders Placed This Encounter  Procedures  . EKG 12-Lead     Signed, Esmond Plants, M.D., Ph.D. 06/10/2019  [place], [place]

## 2019-06-10 ENCOUNTER — Ambulatory Visit (INDEPENDENT_AMBULATORY_CARE_PROVIDER_SITE_OTHER): Payer: PPO | Admitting: Cardiovascular Disease

## 2019-06-10 ENCOUNTER — Encounter: Payer: Self-pay | Admitting: Cardiovascular Disease

## 2019-06-10 ENCOUNTER — Other Ambulatory Visit: Payer: Self-pay

## 2019-06-10 ENCOUNTER — Telehealth: Payer: Self-pay | Admitting: *Deleted

## 2019-06-10 VITALS — BP 110/52 | HR 68 | Ht 68.5 in | Wt 210.0 lb

## 2019-06-10 DIAGNOSIS — I739 Peripheral vascular disease, unspecified: Secondary | ICD-10-CM

## 2019-06-10 DIAGNOSIS — I25118 Atherosclerotic heart disease of native coronary artery with other forms of angina pectoris: Secondary | ICD-10-CM | POA: Diagnosis not present

## 2019-06-10 DIAGNOSIS — G459 Transient cerebral ischemic attack, unspecified: Secondary | ICD-10-CM | POA: Diagnosis not present

## 2019-06-10 DIAGNOSIS — E1143 Type 2 diabetes mellitus with diabetic autonomic (poly)neuropathy: Secondary | ICD-10-CM | POA: Diagnosis not present

## 2019-06-10 DIAGNOSIS — E782 Mixed hyperlipidemia: Secondary | ICD-10-CM

## 2019-06-10 DIAGNOSIS — I6523 Occlusion and stenosis of bilateral carotid arteries: Secondary | ICD-10-CM

## 2019-06-10 DIAGNOSIS — I10 Essential (primary) hypertension: Secondary | ICD-10-CM

## 2019-06-10 MED ORDER — ATORVASTATIN CALCIUM 40 MG PO TABS: 40.0000 mg | ORAL_TABLET | Freq: Every day | ORAL | 3 refills | Status: DC

## 2019-06-10 MED ORDER — HYDRALAZINE HCL 100 MG PO TABS: 100.0000 mg | ORAL_TABLET | Freq: Three times a day (TID) | ORAL | 3 refills | Status: DC

## 2019-06-10 MED ORDER — METOPROLOL SUCCINATE ER 50 MG PO TB24: 50.0000 mg | ORAL_TABLET | Freq: Every day | ORAL | 3 refills | Status: DC

## 2019-06-10 MED ORDER — NITROGLYCERIN 0.4 MG SL SUBL: 0.4000 mg | SUBLINGUAL_TABLET | SUBLINGUAL | 1 refills | Status: DC | PRN

## 2019-06-10 MED ORDER — FUROSEMIDE 40 MG PO TABS: 40.0000 mg | ORAL_TABLET | ORAL | 3 refills | Status: DC

## 2019-06-10 NOTE — Patient Instructions (Addendum)
Medication Instructions:  Your physician has recommended you make the following change in your medication:  1. INCREASE Furosemide (Lasix) 40 mg twice a day with extra 40 mg as needed for swelling  OTC potassium as needed  If you need a refill on your cardiac medications before your next appointment, please call your pharmacy.    Lab work: No new labs needed   If you have labs (blood work) drawn today and your tests are completely normal, you will receive your results only by: [name] MyChart Message (if you have MyChart) OR . A paper copy in the mail If you have any lab test that is abnormal or we need to change your treatment, we will call you to review the results.   Testing/Procedures: No new testing needed   Follow-Up: At [facility], you and your health needs are our priority.  As part of our continuing mission to provide you with exceptional heart care, we have created designated Provider Care Teams.  These Care Teams include your primary Cardiologist (physician) and Advanced Practice Providers (APPs -  Physician Assistants and Nurse Practitioners) who all work together to provide you with the care you need, when you need it.  . You will need a follow up appointment in 6 months .   [name] Providers on your designated Care Team:   . Murray Hodgkins, NP . Christell Faith, PA-C . Marrianne Mood, PA-C  Any Other Special Instructions Will Be Listed Below (If Applicable).  For educational health videos Log in to : [email or web address] Or : [email or web address], password : [email or web address]

## 2019-06-10 NOTE — Telephone Encounter (Signed)
Left voicemail message to call back for test results.

## 2019-06-10 NOTE — Telephone Encounter (Signed)
-----   Message from Minna Merritts, MD sent at 06/09/2019 11:41 AM EDT ----- Carotid u/s left ICA are consistent with a 40-59% stenosis. Right is open, s/p CEA

## 2019-06-10 NOTE — Progress Notes (Signed)
Patient here to see provider and he will discuss results at this time.

## 2019-06-10 NOTE — Telephone Encounter (Signed)
Patient here for appointment with provider and he will review results at that time.

## 2019-06-17 DIAGNOSIS — H25812 Combined forms of age-related cataract, left eye: Secondary | ICD-10-CM | POA: Diagnosis not present

## 2019-06-17 DIAGNOSIS — H2512 Age-related nuclear cataract, left eye: Secondary | ICD-10-CM | POA: Diagnosis not present

## 2019-06-19 DIAGNOSIS — E1143 Type 2 diabetes mellitus with diabetic autonomic (poly)neuropathy: Secondary | ICD-10-CM | POA: Diagnosis not present

## 2019-06-19 DIAGNOSIS — E7800 Pure hypercholesterolemia, unspecified: Secondary | ICD-10-CM | POA: Diagnosis not present

## 2019-06-19 DIAGNOSIS — I10 Essential (primary) hypertension: Secondary | ICD-10-CM | POA: Diagnosis not present

## 2019-06-22 ENCOUNTER — Other Ambulatory Visit: Payer: Self-pay | Admitting: Cardiovascular Disease

## 2019-06-25 DIAGNOSIS — Z6833 Body mass index (BMI) 33.0-33.9, adult: Secondary | ICD-10-CM | POA: Diagnosis not present

## 2019-06-25 DIAGNOSIS — Z0000 Encounter for general adult medical examination without abnormal findings: Secondary | ICD-10-CM | POA: Diagnosis not present

## 2019-06-25 DIAGNOSIS — E1143 Type 2 diabetes mellitus with diabetic autonomic (poly)neuropathy: Secondary | ICD-10-CM | POA: Diagnosis not present

## 2019-06-25 DIAGNOSIS — E6609 Other obesity due to excess calories: Secondary | ICD-10-CM | POA: Diagnosis not present

## 2019-06-25 DIAGNOSIS — E7800 Pure hypercholesterolemia, unspecified: Secondary | ICD-10-CM | POA: Diagnosis not present

## 2019-06-25 DIAGNOSIS — I2510 Atherosclerotic heart disease of native coronary artery without angina pectoris: Secondary | ICD-10-CM | POA: Diagnosis not present

## 2019-06-25 DIAGNOSIS — I10 Essential (primary) hypertension: Secondary | ICD-10-CM | POA: Diagnosis not present

## 2019-06-25 DIAGNOSIS — I5022 Chronic systolic (congestive) heart failure: Secondary | ICD-10-CM | POA: Diagnosis not present

## 2019-06-25 DIAGNOSIS — D329 Benign neoplasm of meninges, unspecified: Secondary | ICD-10-CM | POA: Diagnosis not present

## 2019-06-27 ENCOUNTER — Other Ambulatory Visit: Payer: Self-pay

## 2019-06-27 ENCOUNTER — Ambulatory Visit: Payer: PPO | Admitting: Dermatology

## 2019-06-27 DIAGNOSIS — L905 Scar conditions and fibrosis of skin: Secondary | ICD-10-CM | POA: Diagnosis not present

## 2019-06-27 DIAGNOSIS — L578 Other skin changes due to chronic exposure to nonionizing radiation: Secondary | ICD-10-CM | POA: Diagnosis not present

## 2019-06-27 DIAGNOSIS — D692 Other nonthrombocytopenic purpura: Secondary | ICD-10-CM

## 2019-06-27 DIAGNOSIS — L570 Actinic keratosis: Secondary | ICD-10-CM

## 2019-06-27 NOTE — Patient Instructions (Signed)
Cryotherapy Aftercare  . Wash gently with soap and water everyday.   . Apply Vaseline and Band-Aid daily until healed. Recommend daily broad spectrum sunscreen SPF 30+ to sun-exposed areas, reapply every 2 hours as needed. Call for new or changing lesions.  

## 2019-06-27 NOTE — Progress Notes (Signed)
   Follow-Up Visit   Subjective  Kevin Choi is a 79 y.o. male who presents for the following: Follow-up.  Patient here today for a 3 month AK follow up. There were 5 spots treated with LN2. He is not aware of any new or changing spots.   The following portions of the chart were reviewed this encounter and updated as appropriate:     Review of Systems:  No other skin or systemic complaints except as noted in HPI or Assessment and Plan.  Objective  Well appearing patient in no apparent distress; mood and affect are within normal limits.  A focused examination was performed including head, including the scalp, face, neck, nose, ears, eyelids, and lips and arms. Relevant physical exam findings are noted in the Assessment and Plan.  Objective  left frontal scalp x 1, left crown x 1, left vertex x 1, right vertex x 1, mid crown x 1, right frontal scalp x 1 (6): Erythematous thin papules/macules with gritty scale.   Objective  Frontal Scalp: Linear white scar R frontal scalp   Assessment & Plan  AK (actinic keratosis) (6) left frontal scalp x 1, left crown x 1, left vertex x 1, right vertex x 1, mid crown x 1, right frontal scalp x 1  Destruction of lesion - left frontal scalp x 1, left crown x 1, left vertex x 1, right vertex x 1, mid crown x 1, right frontal scalp x 1  Destruction method: cryotherapy   Informed consent: discussed and consent obtained   Lesion destroyed using liquid nitrogen: Yes   Region frozen until ice ball extended beyond lesion: Yes   Outcome: patient tolerated procedure well with no complications   Post-procedure details: wound care instructions given    Scar Frontal Scalp  Scar from previous brain surgery   Actinic Damage - diffuse scaly erythematous macules with underlying dyspigmentation - Recommend daily broad spectrum sunscreen SPF 30+ to sun-exposed areas, reapply every 2 hours as needed.  - Call for new or changing lesions.  Purpura -  Violaceous macules and patches - Benign - Related to age, sun damage and/or use of blood thinners - Observe - Can use OTC arnica containing moisturizer such as Dermend Bruise Formula if desired - Call for worsening or other concerns  Return in about 6 months (around 12/28/2019) for UBSE, with Dr. Laurence Ferrari.  Graciella Belton, RMA, am acting as scribe for Brendolyn Patty, MD .  Documentation: I have reviewed the above documentation for accuracy and completeness, and I agree with the above.  Brendolyn Patty MD

## 2019-09-20 ENCOUNTER — Other Ambulatory Visit: Payer: Self-pay | Admitting: Cardiovascular Disease

## 2019-10-04 ENCOUNTER — Ambulatory Visit: Payer: PPO | Admitting: Urology

## 2019-10-04 ENCOUNTER — Other Ambulatory Visit: Payer: Self-pay

## 2019-10-04 ENCOUNTER — Encounter: Payer: Self-pay | Admitting: Urology

## 2019-10-04 VITALS — BP 129/68 | HR 55 | Ht 68.0 in | Wt 205.0 lb

## 2019-10-04 DIAGNOSIS — Z87898 Personal history of other specified conditions: Secondary | ICD-10-CM | POA: Diagnosis not present

## 2019-10-04 DIAGNOSIS — N401 Enlarged prostate with lower urinary tract symptoms: Secondary | ICD-10-CM | POA: Diagnosis not present

## 2019-10-04 DIAGNOSIS — R350 Frequency of micturition: Secondary | ICD-10-CM

## 2019-10-04 DIAGNOSIS — R339 Retention of urine, unspecified: Secondary | ICD-10-CM | POA: Diagnosis not present

## 2019-10-04 LAB — BLADDER SCAN AMB NON-IMAGING: Scan Result: 177

## 2019-10-04 MED ORDER — FINASTERIDE 5 MG PO TABS: 5.0000 mg | ORAL_TABLET | Freq: Every day | ORAL | 3 refills | Status: DC

## 2019-10-04 NOTE — Progress Notes (Signed)
10/04/2019 10:29 AM   Kevin Choi Jan 15, 1941 [id number]  Referring provider: [name], MD [facility] [place],  [place] [address]  Chief Complaint  Patient presents with  . Follow-up    1 year follow-up with PVR    Urologic history:  1.  BPH with lower urinary tract symptoms             -On finasteride             -Mild to moderate residual  2.  History elevated PSA             -History of several biopsies between 2003-2010             -No records available; all benign per patient             -Baseline uncorrected PSA upper 1 range  -Prostate cancer screening discontinued   HPI: 79 y.o. male presents for annual follow-up.   States was started on Lasix for CHF and lower extremity edema and was voiding well with the addition of the diuretic however recently he does not feel he is voiding as much and has noted increased ankle edema  Does note increased odor to urine but no dysuria, hematuria or pain  Denies flank, abdominal or pelvic pain  Remains on finasteride   PMH: Past Medical History:  Diagnosis Date  . Allergic rhinitis due to pollen   . CAD (coronary artery disease) 2002   bypass  . Chicken pox   . Diabetes ([place])   . Diabetes mellitus   . Elevated prostate specific antigen (PSA) 02/21/2001  . Headache(784.0)   . Hemiplegic migraine, without mention of intractable migraine without mention of status migrainosus   . HLD (hyperlipidemia)   . HTN (hypertension) 05/08/2007  . Hyperhidrosis   . Impotence of organic origin 07/24/2007  . Leg pain   . Measles   . Mumps   . Nausea with vomiting   . Neuropathy   . Occlusion and stenosis of carotid artery without mention of cerebral infarction 09/15/2006  . Peripheral neuropathy 01/31/2008  . Peripheral vascular disease ([place])   . Personal history of urinary calculi   . Pleural effusion 2007   with pneumothorax   . Restless legs syndrome (RLS) 01/10/2007  .  Slurred speech   . TIA (transient ischemic attack)    Patient stated 2 months ago    Surgical History: Past Surgical History:  Procedure Laterality Date  . CAROTID ENDARTERECTOMY  2001   right  . CATHETER REMOVAL Right 06/03/2019  . CHOLECYSTECTOMY  2003  . collapse lung  2007  . CORONARY ARTERY BYPASS GRAFT  2001  . KNEE SURGERY Left 1981  . LUNG SURGERY  2007   Left; decortication left lung for persistant pleural effusion and PTX. admission ten days at [facility].  [name] PAROTID GLAND TUMOR EXCISION  1973  . VASECTOMY      Home Medications:  Allergies as of 10/04/2019      Reactions   Iodinated Diagnostic Agents Nausea Only   Hypotension Hypotension   Other Nausea Only, Other (See Comments)   Contrast Dye-drops blood pressure. Contrast Dye- hypotension and nausa and syncope      Medication List       Accurate as of October 04, 2019 10:29 AM. If you have any questions, ask your nurse or doctor.        STOP taking these medications   UNABLE TO FIND Stopped by: [name]  [name], MD     TAKE these medications   acetaminophen 500 MG tablet Commonly known as: TYLENOL Take 500 mg by mouth every 6 (six) hours as needed. Patient states he can take 2 tablets daily as needed for pain   atorvastatin 40 MG tablet Commonly known as: LIPITOR Take 1 tablet (40 mg total) by mouth daily.   cetirizine 10 MG tablet Commonly known as: ZYRTEC Take 10 mg by mouth as needed.   finasteride 5 MG tablet Commonly known as: PROSCAR Take 1 tablet (5 mg total) by mouth daily.   furosemide 40 MG tablet Commonly known as: LASIX Take 1 tablet (40 mg total) by mouth as directed. Take 1 tablet (40 mg) twice a day with extra 1 tablet (40 mg) as needed for swelling   gabapentin 300 MG capsule Commonly known as: NEURONTIN Take 300 mg by mouth 3 (three) times daily.   glucose blood test strip   hydrALAZINE 100 MG tablet Commonly known as: APRESOLINE Take 1 tablet (100 mg total) by mouth 3  (three) times daily.   ketoconazole 2 % cream Commonly known as: NIZORAL   magnesium gluconate 500 MG tablet Commonly known as: MAGONATE Take 500 mg by mouth daily.   metFORMIN 500 MG tablet Commonly known as: GLUCOPHAGE Take 500 mg by mouth 2 (two) times daily with a meal.   metoprolol succinate 50 MG 24 hr tablet Commonly known as: TOPROL-XL Take 1 tablet (50 mg total) by mouth daily. Take with or immediately following a meal.   Multi-Vitamin tablet Take by mouth.   nitroGLYCERIN 0.4 MG SL tablet Commonly known as: Nitrostat Place 1 tablet (0.4 mg total) under the tongue every 5 (five) minutes as needed for chest pain.   pramipexole 0.25 MG tablet Commonly known as: Mirapex Take 1 tablet (0.25 mg total) by mouth 3 (three) times daily.   promethazine 12.5 MG tablet Commonly known as: PHENERGAN Take 1 tablet (12.5 mg total) by mouth every 8 (eight) hours as needed for nausea.   quinapril 20 MG tablet Commonly known as: ACCUPRIL TAKE 1 AND 1/2 TABLETS BY MOUTH DAILY   rivaroxaban 20 MG Tabs tablet Commonly known as: Xarelto Take 1 tablet (20 mg total) by mouth daily.   traMADol 50 MG tablet Commonly known as: Ultram Take 1 tablet (50 mg total) by mouth every 6 (six) hours as needed.       Allergies:  Allergies  Allergen Reactions  . Iodinated Diagnostic Agents Nausea Only    Hypotension Hypotension  . Other Nausea Only and Other (See Comments)    Contrast Dye-drops blood pressure. Contrast Dye- hypotension and nausa and syncope    Family History: Family History  Problem Relation Age of Onset  . Heart attack Mother   . Heart failure Father   . Angina Father   . Emphysema Father   . Cancer Father        prostate  . Lymphoma Sister        Non-Hodgkins  . Cancer Other   . Coronary artery disease Other        brother  . Heart attack Brother   . Heart disease Maternal Aunt   . Colon polyps Sister   . Anxiety disorder Brother     Social History:   reports that he has quit smoking. His smoking use included cigarettes. He has a 20.00 pack-year smoking history. He has never used smokeless tobacco. He reports that he does not drink alcohol and does not  use drugs.   Physical Exam: BP 129/68   Pulse (!) 55   Ht 5\' 8"  (1.727 m)   Wt 205 lb (93 kg)   BMI 31.17 kg/m   Constitutional:  Alert and oriented, No acute distress. HEENT: [place] AT, moist mucus membranes.  Trachea midline, no masses. Cardiovascular: No clubbing, cyanosis, or edema. Respiratory: Normal respiratory effort, no increased work of breathing. Skin: No rashes, bruises or suspicious lesions. Neurologic: Grossly intact, no focal deficits, moving all 4 extremities. Psychiatric: Normal mood and affect.    Assessment & Plan:    1.  BPH with incomplete bladder emptying  Bladder scan PVR today was 177 mL  PVR is stable and could be secondary to bladder hypotonicity  Discussed options to potentially improve his emptying with minimally invasive outlet procedures (UroLift) or addition of a prostate-specific alpha-blocker  Intermittent catheterization was also discussed  He does not desire any additional medication or intervention at this time  Follow-up 6 months with repeat PVR  He has a follow-up with Dr. Rockey Situ in October however was instructed to call his office earlier should he have increased lower extremity edema  Finasteride was refilled   Abbie Sons, [facility] [address] [place], [place] [address] [phone]

## 2019-11-11 ENCOUNTER — Ambulatory Visit: Payer: PPO | Attending: Internal Medicine

## 2019-11-11 DIAGNOSIS — Z23 Encounter for immunization: Secondary | ICD-10-CM

## 2019-11-11 NOTE — Progress Notes (Signed)
   Covid-19 Vaccination Clinic  Name:  ILIJAH DOUCET    MRN: [id number] DOB: [birth year]  11/11/2019  Mr. Minturn was observed post Covid-19 immunization for 15 minutes without incident. He was provided with Vaccine Information Sheet and instruction to access the V-Safe system.   Mr. Valvano was instructed to call 911 with any severe reactions post vaccine: [name] Difficulty breathing  . Swelling of face and throat  . A fast heartbeat  . A bad rash all over body  . Dizziness and weakness

## 2019-12-09 ENCOUNTER — Ambulatory Visit: Payer: PPO | Admitting: Cardiovascular Disease

## 2019-12-19 DIAGNOSIS — E7800 Pure hypercholesterolemia, unspecified: Secondary | ICD-10-CM | POA: Diagnosis not present

## 2019-12-19 DIAGNOSIS — I10 Essential (primary) hypertension: Secondary | ICD-10-CM | POA: Diagnosis not present

## 2019-12-19 DIAGNOSIS — E1143 Type 2 diabetes mellitus with diabetic autonomic (poly)neuropathy: Secondary | ICD-10-CM | POA: Diagnosis not present

## 2019-12-24 ENCOUNTER — Other Ambulatory Visit: Payer: Self-pay | Admitting: Cardiovascular Disease

## 2019-12-26 DIAGNOSIS — I10 Essential (primary) hypertension: Secondary | ICD-10-CM | POA: Diagnosis not present

## 2019-12-26 DIAGNOSIS — Z862 Personal history of diseases of the blood and blood-forming organs and certain disorders involving the immune mechanism: Secondary | ICD-10-CM | POA: Diagnosis not present

## 2019-12-26 DIAGNOSIS — E6609 Other obesity due to excess calories: Secondary | ICD-10-CM | POA: Diagnosis not present

## 2019-12-26 DIAGNOSIS — E782 Mixed hyperlipidemia: Secondary | ICD-10-CM | POA: Diagnosis not present

## 2019-12-26 DIAGNOSIS — E1143 Type 2 diabetes mellitus with diabetic autonomic (poly)neuropathy: Secondary | ICD-10-CM | POA: Diagnosis not present

## 2019-12-26 DIAGNOSIS — Z6832 Body mass index (BMI) 32.0-32.9, adult: Secondary | ICD-10-CM | POA: Diagnosis not present

## 2020-01-02 ENCOUNTER — Ambulatory Visit: Payer: PPO | Admitting: Dermatology

## 2020-01-02 ENCOUNTER — Other Ambulatory Visit: Payer: Self-pay

## 2020-01-02 DIAGNOSIS — L720 Epidermal cyst: Secondary | ICD-10-CM | POA: Diagnosis not present

## 2020-01-02 DIAGNOSIS — L814 Other melanin hyperpigmentation: Secondary | ICD-10-CM

## 2020-01-02 DIAGNOSIS — Z86018 Personal history of other benign neoplasm: Secondary | ICD-10-CM

## 2020-01-02 DIAGNOSIS — L578 Other skin changes due to chronic exposure to nonionizing radiation: Secondary | ICD-10-CM | POA: Diagnosis not present

## 2020-01-02 DIAGNOSIS — L821 Other seborrheic keratosis: Secondary | ICD-10-CM | POA: Diagnosis not present

## 2020-01-02 DIAGNOSIS — L570 Actinic keratosis: Secondary | ICD-10-CM

## 2020-01-02 DIAGNOSIS — D229 Melanocytic nevi, unspecified: Secondary | ICD-10-CM

## 2020-01-02 DIAGNOSIS — Z1283 Encounter for screening for malignant neoplasm of skin: Secondary | ICD-10-CM | POA: Diagnosis not present

## 2020-01-02 DIAGNOSIS — L219 Seborrheic dermatitis, unspecified: Secondary | ICD-10-CM

## 2020-01-02 DIAGNOSIS — D1800 Hemangioma unspecified site: Secondary | ICD-10-CM

## 2020-01-02 MED ORDER — KETOCONAZOLE 2 % EX SHAM: MEDICATED_SHAMPOO | CUTANEOUS | 2 refills | Status: AC

## 2020-01-02 NOTE — Progress Notes (Addendum)
Follow-Up Visit   Subjective  Kevin Choi is a 79 y.o. male who presents for the following: UBSE (Upper body exam today. Hx moderate dysplastic nevus, right mid back, 2009.  ) and Follow-up (6 mo AK f/u, left frontal scalp x 1, left crown x 1, left vertex x 1, right vertex x 1, mid crown x 1, and frontal scalp x 1. ).  He reports his scalp is doing well and he needs a refill on ketoconazole shampoo.  The following portions of the chart were reviewed this encounter and updated as appropriate: Tobacco  Allergies  Meds  Problems  Med Hx  Surg Hx  Fam Hx      Review of Systems: No other skin or systemic complaints except as noted in HPI or Assessment and Plan.   Objective  Well appearing patient in no apparent distress; mood and affect are within normal limits.  All skin waist up examined.  Objective  left frontal scalp x 1, upper mid forehead x 1, left vertex scalp x 1, left helix x 1, vertex scalp x 2 (6): Erythematous thin papules/macules with gritty scale.   Objective  left posterior neck: Subcutaneous nodule.   Objective  Scalp: Pink patches with greasy scale.   Assessment & Plan  AK (actinic keratosis) (6) left frontal scalp x 1, upper mid forehead x 1, left vertex scalp x 1, left helix x 1, vertex scalp x 2  Prior to procedure, discussed risks of blister formation, small wound, skin dyspigmentation, or rare scar following cryotherapy.    Destruction of lesion - left frontal scalp x 1, upper mid forehead x 1, left vertex scalp x 1, left helix x 1, vertex scalp x 2  Destruction method: cryotherapy   Informed consent: discussed and consent obtained   Lesion destroyed using liquid nitrogen: Yes   Cryotherapy cycles:  2 Outcome: patient tolerated procedure well with no complications   Post-procedure details: wound care instructions given    Epidermal cyst left posterior neck  Benign-appearing. Exam most consistent with an epidermal inclusion cyst.  Discussed that a cyst is a benign growth that can grow over time and sometimes get irritated or inflamed. Recommend observation if it is not bothersome. Discussed option of surgical excision to remove it if it is growing, symptomatic, or other changes noted. Please call for new or changing lesions so they can be evaluated.  Seborrheic dermatitis Scalp  Chronic, Well controlled. Continue ketoconazole shampoo apply three times per week, massage into scalp and leave in for 10 minutes before rinsing out    ketoconazole (NIZORAL) 2 % shampoo - Scalp   Lentigines - Scattered tan macules - Discussed due to sun exposure - Benign, observe - Call for any changes  Seborrheic Keratoses - Stuck-on, waxy, tan-brown papules and plaques  - Discussed benign etiology and prognosis. - Observe - Call for any changes  Melanocytic Nevi - Tan-brown and/or pink-flesh-colored symmetric macules and papules - Benign appearing on exam today - Observation - Call clinic for new or changing moles - Recommend daily use of broad spectrum spf 30+ sunscreen to sun-exposed areas.   Hemangiomas - Red papules - Discussed benign nature - Observe - Call for any changes  Actinic Damage - Chronic, secondary to cumulative UV/sun exposure - diffuse scaly erythematous macules with underlying dyspigmentation - Recommend daily broad spectrum sunscreen SPF 30+ to sun-exposed areas, reapply every 2 hours as needed.  - Call for new or changing lesions.  Skin cancer screening performed today.  History of Dysplastic Nevi Right mid back, 2009 - No evidence of recurrence today - Recommend regular full body skin exams - Recommend daily broad spectrum sunscreen SPF 30+ to sun-exposed areas, reapply every 2 hours as needed.  - Call if any new or changing lesions are noted between office visits   Return in about 6 months (around 07/01/2020) for TBSE.   I, Harriett Sine, CMA, am acting as scribe for Forest Gleason,  MD.  Documentation: I have reviewed the above documentation for accuracy and completeness, and I agree with the above.  Forest Gleason, MD

## 2020-01-14 NOTE — Progress Notes (Signed)
Cardiology Office Note  Date:  01/15/2020   ID:  ESWIN WORRELL, DOB Jun 25, 1940, MRN [id number]  PCP:  [name], MD   Chief Complaint  Patient presents with   Follow-up    6 Months follow up and c/o left leg pain off and on for awhile now. Medications verbally reviewed with patient.     HPI:  79 year old gentleman with a history of  coronary artery disease,  bypass surgery with a LIMA to the LAD in 2002,  vein graft to the diagonal,  diabetes,  hypertension,  hyperlipidemia,  pleural effusion requiring a VATS,  carotid endarterectomy on the right in 2001,  TIA symptoms 3 times, once in 2002, October 2012 in March 2013. Status post bilateral total knee replacements 40% left carotid arterial disease resection of right frontal meningioma. On xarelto for TIAs who presents for routine followup of his coronary artery disease ,  leg swelling and shortness of breath.  In follow-up today reports he is doing relatively well He does have consistent lower extremity edema Weight stable 202 to 205 Periodically weight up to 208 BP stable 120 to 140  He does report high fluid intake Eats out frequently Has leg swelling, despite Lasix 40 twice a day Sometimes has to miss a dose  Lab work reviewed HAB1C 6.9 Total chol 128  EKG personally reviewed by myself on todays visit Shows normal sinus rhythm rate 77 bpm left bundle branch block  Other past medical history reviewed Numb left arm, 03/2019 Left hands fingers not moving well going through folders Numbness got worse Left lip numb Lasted 45 min, then resolved without intervention No further episodes, etiology unclear  Previous MRI/MRA from 2019 reviewed Meningioma noted, chronic small vessel disease noted  05/2019 Carotid u/s, results reviewed left ICA are consistent with a 40-59% stenosis. Right is open, s/p CEA  Echo 2019 - Left ventricle: The cavity size was normal. There was moderate  concentric  hypertrophy. Systolic function was mildly to  moderately reduced. The estimated ejection fraction was in the  range of 40% to 45%.   Previous hearing problem,  led to MRI of the brain Discovery of mass right side of the brain Evaluated at [facility] found to have meningioma Had resection  04/21/2017 surgery resection     May 14 2011  he had acute onset of slurred speech. He was playing cards shortly after and he was unable to do any math. Symptoms lasted for approximately one to 2 hours. Blood pressure prior to this event had been well controlled though during and after the event, he had severe hypertension with systolic pressures sometimes greater than 200. He was in the emergency room for 2 days with extensive testing.    PMH:   has a past medical history of Allergic rhinitis due to pollen, CAD (coronary artery disease) (2002), Chicken pox, Diabetes ([place]), Diabetes mellitus, Dysplastic nevus (01/25/2008), Elevated prostate specific antigen (PSA) (02/21/2001), Headache(784.0), Hemiplegic migraine, without mention of intractable migraine without mention of status migrainosus, HLD (hyperlipidemia), HTN (hypertension) (05/08/2007), Hyperhidrosis, Impotence of organic origin (07/24/2007), Leg pain, Measles, Mumps, Nausea with vomiting, Neuropathy, Occlusion and stenosis of carotid artery without mention of cerebral infarction (09/15/2006), Peripheral neuropathy (01/31/2008), Peripheral vascular disease ([place]), Personal history of urinary calculi, Pleural effusion (2007), Restless legs syndrome (RLS) (01/10/2007), Slurred speech, and TIA (transient ischemic attack).  PSH:    Past Surgical History:  Procedure Laterality Date   CAROTID ENDARTERECTOMY  2001   right   CATHETER REMOVAL Right  06/03/2019   CHOLECYSTECTOMY  2003   collapse lung  2007   CORONARY ARTERY BYPASS GRAFT  2001   KNEE SURGERY Left 1981   LUNG SURGERY  2007   Left; decortication left lung for persistant pleural  effusion and PTX. admission ten [facility].   PAROTID GLAND TUMOR EXCISION  1973   VASECTOMY      Current Outpatient Medications  Medication Sig Dispense Refill   acetaminophen (TYLENOL) 500 MG tablet Take 500 mg by mouth every 6 (six) hours as needed. Patient states he can take 2 tablets daily as needed for pain     atorvastatin (LIPITOR) 40 MG tablet Take 1 tablet (40 mg total) by mouth daily. 90 tablet 3   cetirizine (ZYRTEC) 10 MG tablet Take 10 mg by mouth as needed.      finasteride (PROSCAR) 5 MG tablet Take 1 tablet (5 mg total) by mouth daily. 90 tablet 3   furosemide (LASIX) 40 MG tablet Take 1 tablet (40 mg total) by mouth as directed. Take 1 tablet (40 mg) twice a day with extra 1 tablet (40 mg) as needed for swelling 270 tablet 3   gabapentin (NEURONTIN) 300 MG capsule Take 300 mg by mouth 3 (three) times daily.     glucose blood test strip      hydrALAZINE (APRESOLINE) 100 MG tablet Take 1 tablet (100 mg total) by mouth 3 (three) times daily. 270 tablet 3   ketoconazole (NIZORAL) 2 % cream      ketoconazole (NIZORAL) 2 % shampoo Apply to scalp as directed. Leave in for 10 minutes before rinsing. 120 mL 2   magnesium gluconate (MAGONATE) 500 MG tablet Take 500 mg by mouth daily.     metFORMIN (GLUCOPHAGE) 500 MG tablet Take 500 mg by mouth 2 (two) times daily with a meal.     metoprolol succinate (TOPROL-XL) 50 MG 24 hr tablet Take 1 tablet (50 mg total) by mouth daily. Take with or immediately following a meal. 90 tablet 3   Multiple Vitamin (MULTI-VITAMIN) tablet Take by mouth.     nitroGLYCERIN (NITROSTAT) 0.4 MG SL tablet Place 1 tablet (0.4 mg total) under the tongue every 5 (five) minutes as needed for chest pain. 25 tablet 1   pramipexole (MIRAPEX) 0.25 MG tablet Take 1 tablet (0.25 mg total) by mouth 3 (three) times daily. 270 tablet 3   promethazine (PHENERGAN) 12.5 MG tablet Take 1 tablet (12.5 mg total) by mouth every 8 (eight) hours as needed for  nausea. 20 tablet 0   quinapril (ACCUPRIL) 20 MG tablet TAKE 1 AND 1/2 TABLETS BY MOUTH DAILY 135 tablet 0   Rivaroxaban (XARELTO) 20 MG TABS tablet Take 1 tablet (20 mg total) by mouth daily. 90 tablet 3   traMADol (ULTRAM) 50 MG tablet Take 1 tablet (50 mg total) by mouth every 6 (six) hours as needed. 15 tablet 0   No current facility-administered medications for this visit.     Allergies:   Iodinated diagnostic agents and Other   Social History:  The patient  reports that he has quit smoking. His smoking use included cigarettes. He has a 20.00 pack-year smoking history. He has never used smokeless tobacco. He reports that he does not drink alcohol and does not use drugs.   Family History:   family history includes Angina in his father; Anxiety disorder in his brother; Cancer in his father and another family member; Colon polyps in his sister; Coronary artery disease in an other  family member; Emphysema in his father; Heart attack in his brother and mother; Heart disease in his maternal aunt; Heart failure in his father; Lymphoma in his sister.    Review of Systems: Review of Systems  Constitutional: Negative.   HENT: Negative.   Respiratory: Negative.   Cardiovascular: Negative.   Gastrointestinal: Negative.   Musculoskeletal: Negative.   Neurological: Negative.   Psychiatric/Behavioral: Negative.   All other systems reviewed and are negative.   PHYSICAL EXAM: VS:  BP (!) 144/68 (BP Location: Left Arm, Patient Position: Sitting, Cuff Size: Normal)    Pulse 77    Ht 5\' 8"  (1.727 m)    Wt 210 lb (95.3 kg)    SpO2 96%    BMI 31.93 kg/m  , BMI Body mass index is 31.93 kg/m. Constitutional:  oriented to person, place, and time. No distress.  HENT:  Head: Grossly normal Eyes:  no discharge. No scleral icterus.  Neck: No JVD, no carotid bruits  Cardiovascular: Regular rate and rhythm, no murmurs appreciated Trace edema above the sock line Pulmonary/Chest: Clear to auscultation  bilaterally, no wheezes or rails Abdominal: Soft.  no distension.  no tenderness.  Musculoskeletal: Normal range of motion Neurological:  normal muscle tone. Coordination normal. No atrophy Skin: Skin warm and dry Psychiatric: normal affect, pleasant   Recent Labs: No results found for requested labs within last 8760 hours.    Lipid Panel Lab Results  Component Value Date   CHOL 147 06/06/2013   HDL 37.90 (L) 06/06/2013   LDLCALC 61 06/06/2013   TRIG 243.0 (H) 06/06/2013      Wt Readings from Last 3 Encounters:  01/15/20 210 lb (95.3 kg)  10/04/19 205 lb (93 kg)  06/10/19 210 lb (95.3 kg)     ASSESSMENT AND PLAN:  Essential hypertension - Plan: EKG 12-Lead Blood pressure is well controlled on today's visit. No changes made to the medications.  Chronic systolic and diastolic CHF High fluid intake, eat out frequently Stressed importance of Lasix 40 twice daily, Take extra Lasix for weight 206 pounds or higher Baseline weight likely 202 to 204  Shortness of breath Likely multifactorial including obesity, deconditioning, fluid retention/pulmonary edema  recommend regular walking program  Atherosclerosis of native coronary artery with stable angina Ejection fraction 40-45%,  Previous stress test 2018 with ejection fraction 47% Denies anginal symptoms, cholesterol at goal, diabetes numbers at goal  Carotid disease 40% to 50% disease on the left  Right side less than 39, Stable  Numbness left arm Etiology unclear, concerning for TIA He does have chronic small vessel disease Managed with Xarelto  Hyperlipidemia Cholesterol is at goal on the current lipid regimen. No changes to the medications were made.  Diabetes type 2 with complications Recommend continued dietary discretion, less eating out, walking program   Total encounter time more than 25 minutes  Greater than 50% was spent in counseling and coordination of care with the patient    Orders Placed  This Encounter  Procedures   EKG 12-Lead     Signed, Esmond Plants, M.D., Ph.D. 01/15/2020  [place], [place]

## 2020-01-15 ENCOUNTER — Ambulatory Visit: Payer: PPO | Admitting: Cardiovascular Disease

## 2020-01-15 ENCOUNTER — Other Ambulatory Visit: Payer: Self-pay

## 2020-01-15 ENCOUNTER — Encounter: Payer: Self-pay | Admitting: Cardiovascular Disease

## 2020-01-15 VITALS — BP 144/68 | HR 77 | Ht 68.0 in | Wt 210.0 lb

## 2020-01-15 DIAGNOSIS — I739 Peripheral vascular disease, unspecified: Secondary | ICD-10-CM

## 2020-01-15 DIAGNOSIS — I10 Essential (primary) hypertension: Secondary | ICD-10-CM

## 2020-01-15 DIAGNOSIS — E1143 Type 2 diabetes mellitus with diabetic autonomic (poly)neuropathy: Secondary | ICD-10-CM | POA: Diagnosis not present

## 2020-01-15 DIAGNOSIS — I6523 Occlusion and stenosis of bilateral carotid arteries: Secondary | ICD-10-CM

## 2020-01-15 DIAGNOSIS — E782 Mixed hyperlipidemia: Secondary | ICD-10-CM

## 2020-01-15 DIAGNOSIS — G459 Transient cerebral ischemic attack, unspecified: Secondary | ICD-10-CM | POA: Diagnosis not present

## 2020-01-15 DIAGNOSIS — I25118 Atherosclerotic heart disease of native coronary artery with other forms of angina pectoris: Secondary | ICD-10-CM

## 2020-01-15 NOTE — Patient Instructions (Addendum)
Medication Instructions:  Cut back on evening fluids For weight >206 pounds, take 1 1/2 lasix twice ad ay Otherwise 1 pill twice a day   If you need a refill on your cardiac medications before your next appointment, please call your pharmacy.    Lab work: No new labs needed   If you have labs (blood work) drawn today and your tests are completely normal, you will receive your results only by: [name] MyChart Message (if you have MyChart) OR . A paper copy in the mail If you have any lab test that is abnormal or we need to change your treatment, we will call you to review the results.   Testing/Procedures: No new testing needed   Follow-Up: At [facility], you and your health needs are our priority.  As part of our continuing mission to provide you with exceptional heart care, we have created designated Provider Care Teams.  These Care Teams include your primary Cardiologist (physician) and Advanced Practice Providers (APPs -  Physician Assistants and Nurse Practitioners) who all work together to provide you with the care you need, when you need it.  . You will need a follow up appointment in 6 months  . Providers on your designated Care Team:   . Murray Hodgkins, NP . Christell Faith, PA-C . Marrianne Mood, PA-C  Any Other Special Instructions Will Be Listed Below (If Applicable).  COVID-19 Vaccine Information can be found at: [email or web address].uk For questions related to vaccine distribution or appointments, please email [email or web address] .[email or web address] or call [phone].

## 2020-01-19 ENCOUNTER — Encounter: Payer: Self-pay | Admitting: Dermatology

## 2020-02-04 DIAGNOSIS — D329 Benign neoplasm of meninges, unspecified: Secondary | ICD-10-CM | POA: Diagnosis not present

## 2020-02-04 DIAGNOSIS — D320 Benign neoplasm of cerebral meninges: Secondary | ICD-10-CM | POA: Diagnosis not present

## 2020-02-06 IMAGING — MR MR HEAD WO/W CM
9 of 13 series · 30 of 48 positions shown · IV contrast (multihance)
Comparison: MRI head 03/09/2017

CLINICAL DATA: Meningioma resection 04/21/2017.

EXAM:
MRI HEAD WITHOUT AND WITH CONTRAST
TECHNIQUE: Multiplanar, multiecho pulse sequences of the brain and surrounding
structures were obtained without and with intravenous contrast.
CONTRAST:  20 mL MultiHance IV

[Series 4: DWI · axial · 3.0mm · 0.94mm/px · z∈[-64,+82]mm · 4 of 52 slices shown (1 of 2)]
[im 1/52]
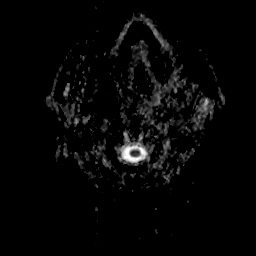
[im 18/52]
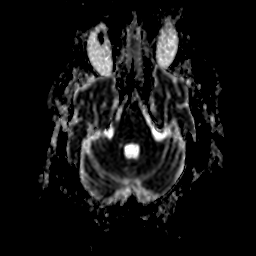
[im 35/52]
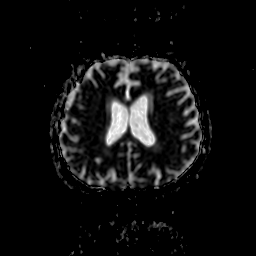
[im 52/52]
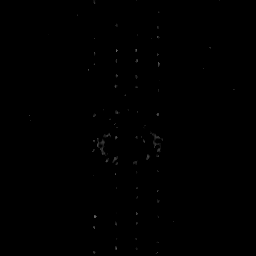

[Series 6: DWI · coronal · 5.0mm · 1.80mm/px · 3 of 37 slices shown (2 of 2)]
[im 1/37]
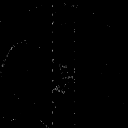
[im 19/37]
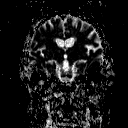
[im 37/37]
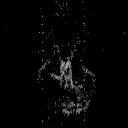

[Series 7: T2 · axial · 5.0mm · 0.45mm/px · z∈[-66,+81]mm · 2 of 23 slices shown (1 of 2)]
[im 1/23]
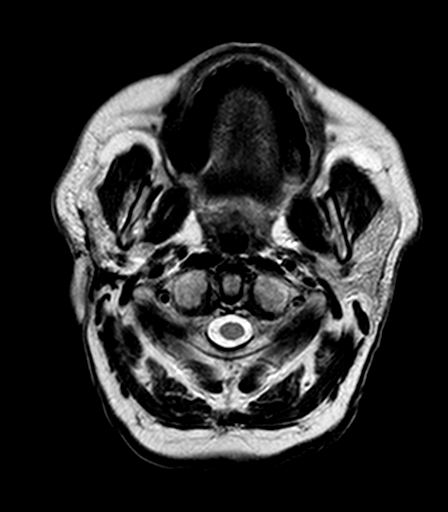
[im 23/23]
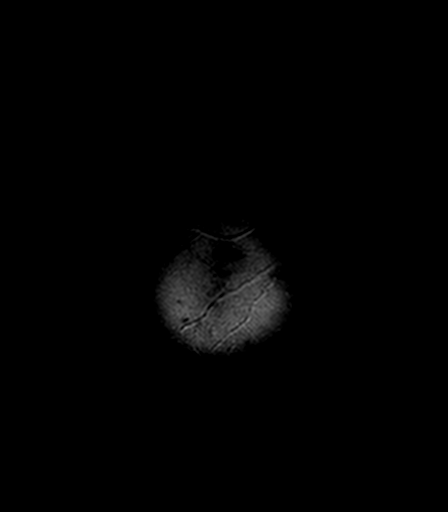

[Series 8: FLAIR · axial · 3.0mm · 0.90mm/px · z∈[-68,+74]mm · 3 of 50 slices shown]
[im 1/50]
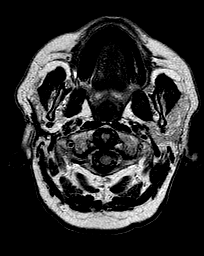
[im 25/50]
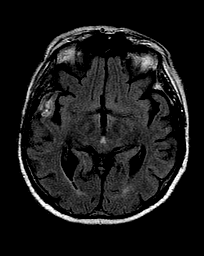
[im 50/50]
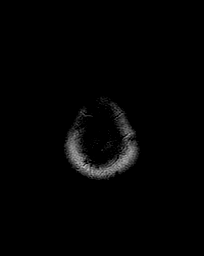

[Series 9: T2 · axial · 5.0mm · 0.45mm/px · z∈[-66,+81]mm · 2 of 23 slices shown (2 of 2)]
[im 1/23]
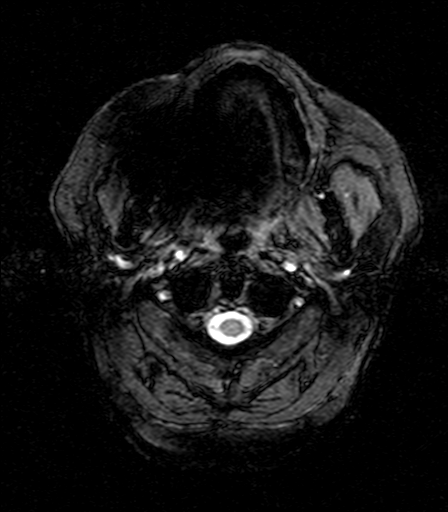
[im 23/23]
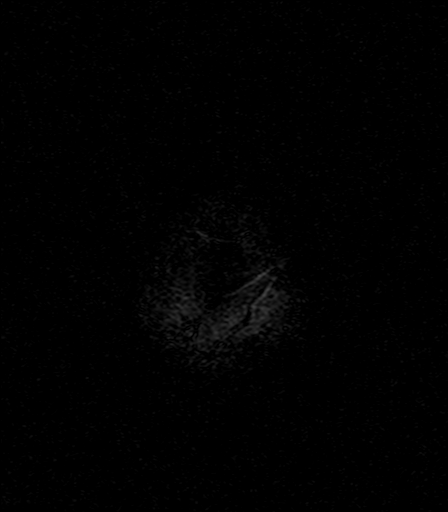

[Series 13: T2 post-contrast · coronal · 5.0mm · 0.45mm/px · 1 of 27 slices shown]
[im 1/27]
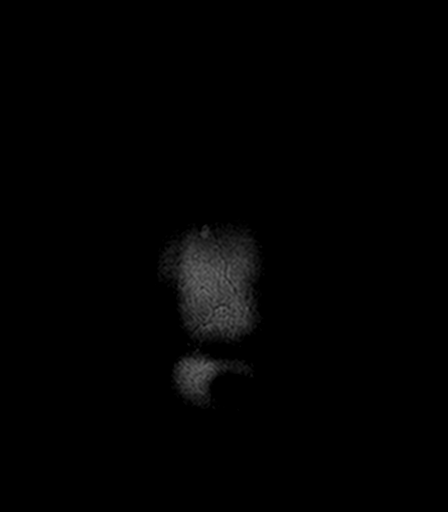

[Series 14: T1 post-contrast · axial · 1.0mm · 0.50mm/px · z∈[-64,+88]mm · 11 of 160 slices shown (1 of 3)]
[im 1/160]
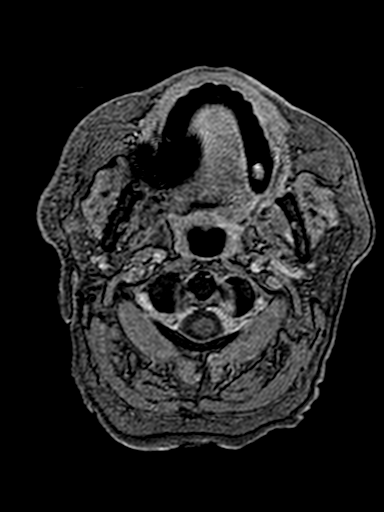
[im 16/160]
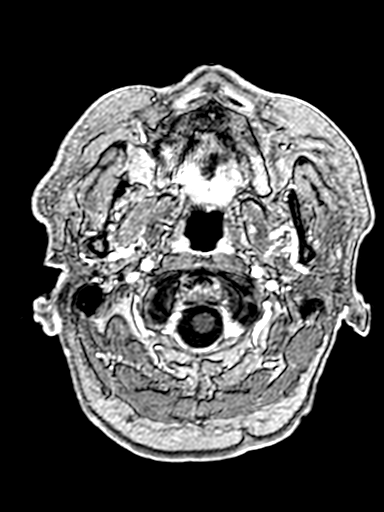
[im 32/160]
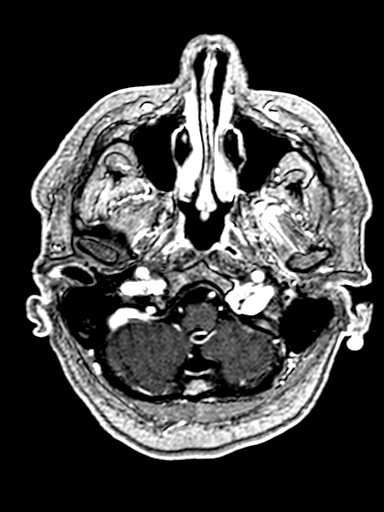
[im 48/160]
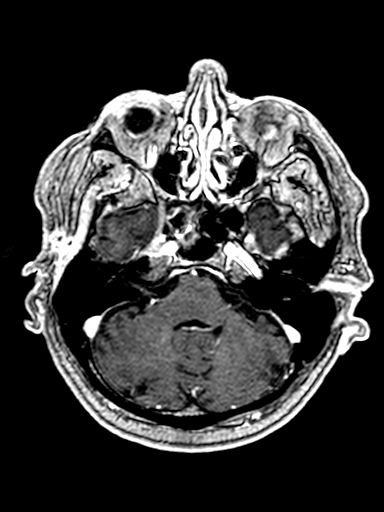
[im 64/160]
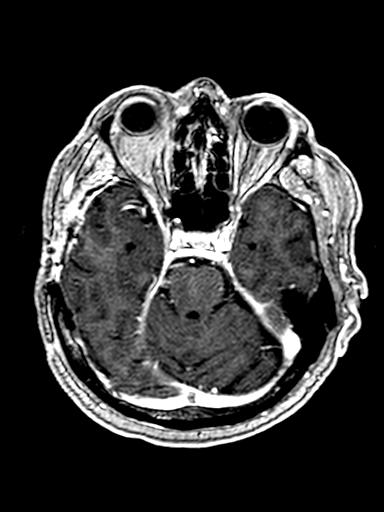
[im 80/160]
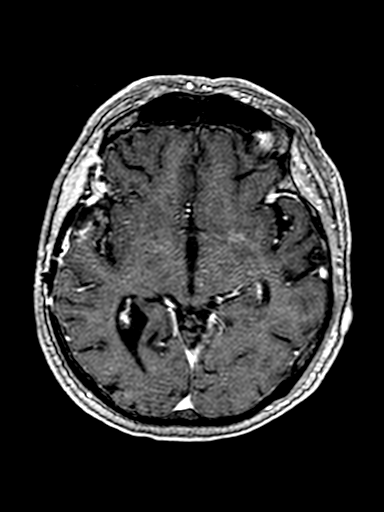
[im 96/160]
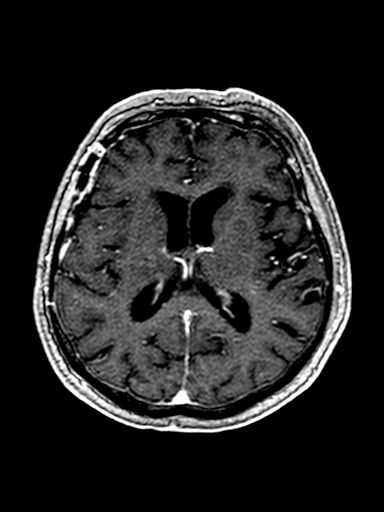
[im 112/160]
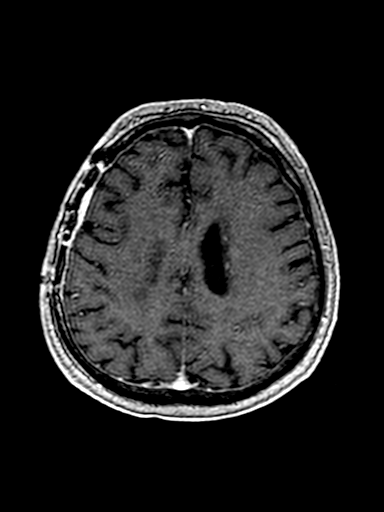
[im 128/160]
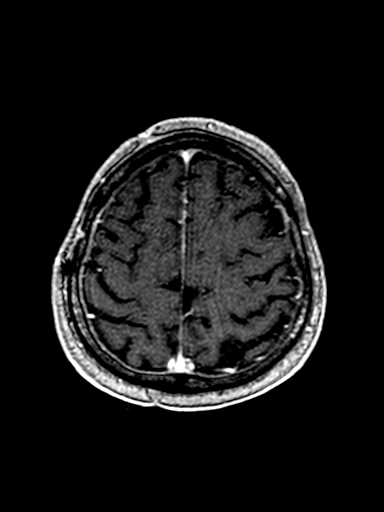
[im 144/160]
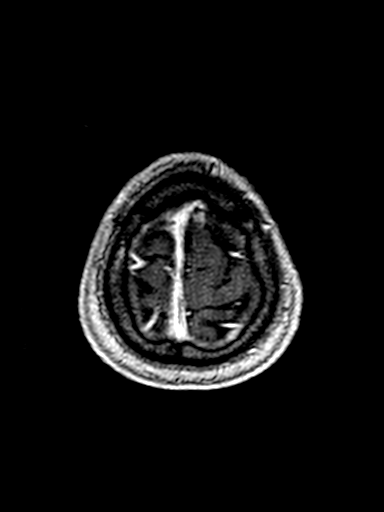
[im 160/160]
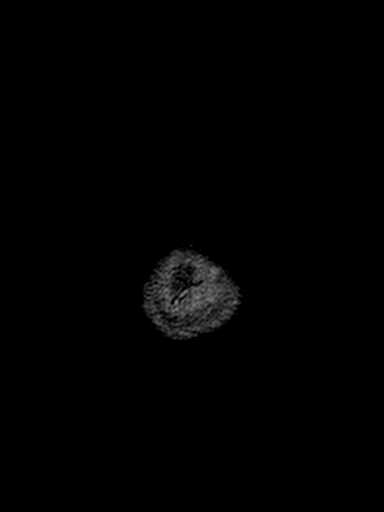

[Series 15: T1 post-contrast · coronal · 5.0mm · 0.45mm/px · 2 of 27 slices shown (2 of 3)]
[im 1/27]
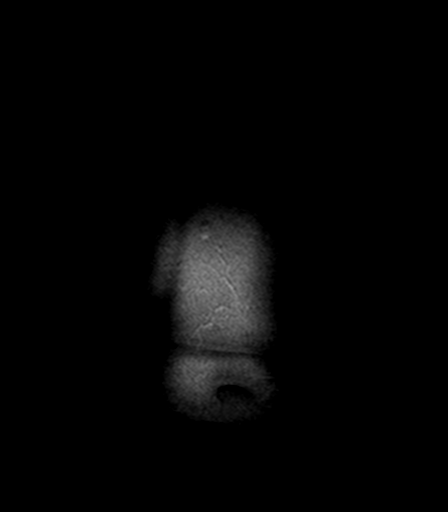
[im 27/27]
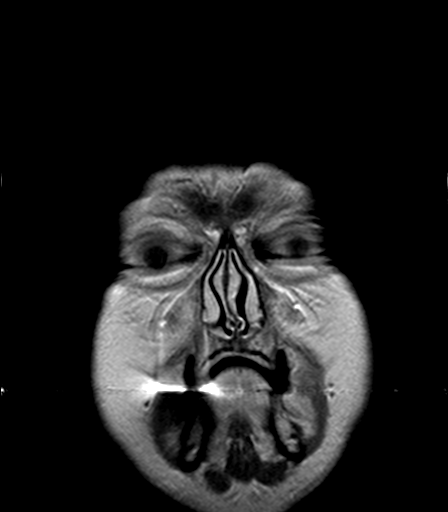

[Series 16: T1 post-contrast · sagittal · 5.0mm · 0.45mm/px · 2 of 29 slices shown (3 of 3)]
[im 1/29]
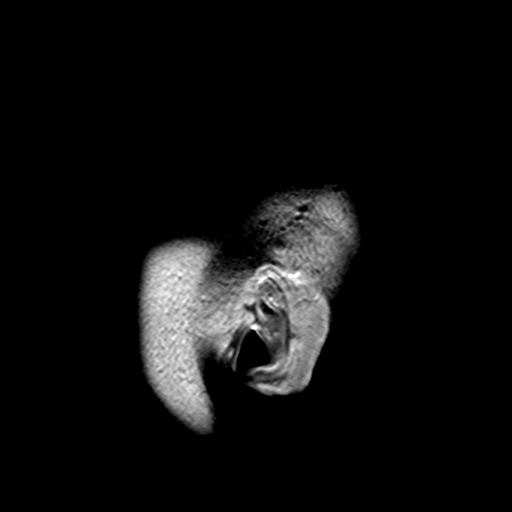
[im 29/29]
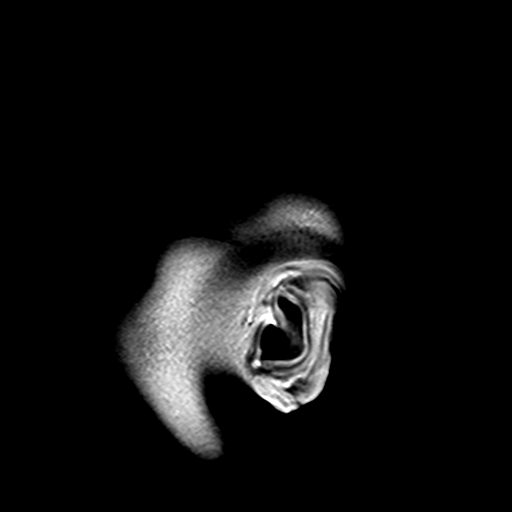

[30 of 48 positions shown; findings below may reference images not displayed]

FINDINGS: Brain: Interval resection of right frontal meningioma. Thickened
enhancing dura at the surgical field measures 4.5 mm. Small amount
of postop chronic hemorrhage noted. No significant fluid collection
or midline shift. Mild encephalomalacia lateral to the sylvian
fissure on the right with associated patchy intraaxial enhancement
most likely due to subacute infarct which continues to show
enhancement. Correlate with immediate postop MRI if available.
Follow-up MRI suggested.

9 mm extra-axial enhancing mass lateral to the pons unchanged from
the prior study most compatible with meningioma extending below the
tentorium. No other enhancing mass lesion.

Mild atrophy and mild chronic microvascular ischemia in the white
matter.

Vascular: Normal arterial flow voids

Skull and upper cervical spine: Right-sided craniotomy.

Sinuses/Orbits: Negative

Other: None
IMPRESSION: Postop resection of meningioma lateral to the sylvian fissure on the
right. Mild encephalomalacia with patchy enhancement most compatible
with late subacute infarction. Dural thickening and enhancement in
the surgical bed most likely postop changes in the dura. Correlate
with immediate postop MRI and correlate with follow-up MRI for
stability.

9 mm mass lateral to the pons is stable. Probable meningioma
although nerve sheath tumor possible.

## 2020-03-12 ENCOUNTER — Other Ambulatory Visit: Payer: Self-pay | Admitting: Cardiovascular Disease

## 2020-03-12 NOTE — Telephone Encounter (Signed)
Rx request sent to pharmacy.  

## 2020-04-03 ENCOUNTER — Other Ambulatory Visit: Payer: Self-pay

## 2020-04-03 ENCOUNTER — Ambulatory Visit (INDEPENDENT_AMBULATORY_CARE_PROVIDER_SITE_OTHER): Payer: PPO | Admitting: Urology

## 2020-04-03 ENCOUNTER — Encounter: Payer: Self-pay | Admitting: Urology

## 2020-04-03 VITALS — BP 155/76 | HR 73 | Ht 68.0 in | Wt 202.0 lb

## 2020-04-03 DIAGNOSIS — N401 Enlarged prostate with lower urinary tract symptoms: Secondary | ICD-10-CM | POA: Diagnosis not present

## 2020-04-03 DIAGNOSIS — R350 Frequency of micturition: Secondary | ICD-10-CM

## 2020-04-03 LAB — BLADDER SCAN AMB NON-IMAGING: Scan Result: 63

## 2020-04-03 MED ORDER — FINASTERIDE 5 MG PO TABS
5.0000 mg | ORAL_TABLET | Freq: Every day | ORAL | 3 refills | Status: DC
Start: 2020-04-03 — End: 2021-04-05

## 2020-04-03 NOTE — Progress Notes (Signed)
04/03/2020 10:44 AM   Kevin Choi 1940-08-05 [id number]  Referring provider: [name], MD [facility] [place],  [place] [address]  Chief Complaint  Patient presents with  . Benign Prostatic Hypertrophy    Urologic history:  1.BPH with lower urinary tract symptoms -On finasteride -Mild to moderate residual  2.History elevated PSA -History of several biopsies between 2003-2010 -No records available; all benign per patient -Baseline uncorrected PSA upper 1 range             -Prostate cancer screening discontinued  HPI: 80 y.o. male presents for annual follow-up.   No significant changes since last years visit  No bothersome LUTS  Remains on finasteride  Denies dysuria, gross hematuria  Denies flank, abdominal or pelvic pain  PMH: Past Medical History:  Diagnosis Date  . Allergic rhinitis due to pollen   . CAD (coronary artery disease) 2002   bypass  . Chicken pox   . Diabetes ([place])   . Diabetes mellitus   . Dysplastic nevus 01/25/2008   Right mid back. Moderate atypia, close to margin.  . Elevated prostate specific antigen (PSA) 02/21/2001  . Headache(784.0)   . Hemiplegic migraine, without mention of intractable migraine without mention of status migrainosus   . HLD (hyperlipidemia)   . HTN (hypertension) 05/08/2007  . Hyperhidrosis   . Impotence of organic origin 07/24/2007  . Leg pain   . Measles   . Mumps   . Nausea with vomiting   . Neuropathy   . Occlusion and stenosis of carotid artery without mention of cerebral infarction 09/15/2006  . Peripheral neuropathy 01/31/2008  . Peripheral vascular disease ([place])   . Personal history of urinary calculi   . Pleural effusion 2007   with pneumothorax   . Restless legs syndrome (RLS) 01/10/2007  . Slurred speech   . TIA (transient ischemic attack)    Patient stated 2 months ago    Surgical  History: Past Surgical History:  Procedure Laterality Date  . CAROTID ENDARTERECTOMY  2001   right  . CATHETER REMOVAL Right 06/03/2019  . CHOLECYSTECTOMY  2003  . collapse lung  2007  . CORONARY ARTERY BYPASS GRAFT  2001  . KNEE SURGERY Left 1981  . LUNG SURGERY  2007   Left; decortication left lung for persistant pleural effusion and PTX. admission ten days at [facility].  [name] PAROTID GLAND TUMOR EXCISION  1973  . VASECTOMY      Home Medications:  Allergies as of 04/03/2020      Reactions   Iodinated Diagnostic Agents Nausea Only   Hypotension Hypotension   Other Nausea Only, Other (See Comments)   Contrast Dye-drops blood pressure. Contrast Dye- hypotension and nausa and syncope      Medication List       Accurate as of April 03, 2020 10:44 AM. If you have any questions, ask your nurse or doctor.        acetaminophen 500 MG tablet Commonly known as: TYLENOL Take 500 mg by mouth every 6 (six) hours as needed. Patient states he can take 2 tablets daily as needed for pain   atorvastatin 40 MG tablet Commonly known as: LIPITOR Take 1 tablet (40 mg total) by mouth daily.   cetirizine 10 MG tablet Commonly known as: ZYRTEC Take 10 mg by mouth as needed.   finasteride 5 MG tablet Commonly known as: PROSCAR Take 1 tablet (5 mg total) by mouth daily.   furosemide 40 MG tablet Commonly  known as: LASIX Take 1 tablet (40 mg total) by mouth as directed. Take 1 tablet (40 mg) twice a day with extra 1 tablet (40 mg) as needed for swelling   gabapentin 300 MG capsule Commonly known as: NEURONTIN Take 300 mg by mouth 3 (three) times daily.   glucose blood test strip   hydrALAZINE 100 MG tablet Commonly known as: APRESOLINE Take 1 tablet (100 mg total) by mouth 3 (three) times daily.   ketoconazole 2 % cream Commonly known as: NIZORAL   ketoconazole 2 % shampoo Commonly known as: NIZORAL Apply to scalp as directed. Leave in for 10 minutes before rinsing.   magnesium  gluconate 500 MG tablet Commonly known as: MAGONATE Take 500 mg by mouth daily.   metFORMIN 500 MG tablet Commonly known as: GLUCOPHAGE Take 500 mg by mouth 2 (two) times daily with a meal.   metoprolol succinate 50 MG 24 hr tablet Commonly known as: TOPROL-XL Take 1 tablet (50 mg total) by mouth daily. Take with or immediately following a meal.   Multi-Vitamin tablet Take by mouth.   nitroGLYCERIN 0.4 MG SL tablet Commonly known as: Nitrostat Place 1 tablet (0.4 mg total) under the tongue every 5 (five) minutes as needed for chest pain.   pramipexole 0.25 MG tablet Commonly known as: Mirapex Take 1 tablet (0.25 mg total) by mouth 3 (three) times daily.   promethazine 12.5 MG tablet Commonly known as: PHENERGAN Take 1 tablet (12.5 mg total) by mouth every 8 (eight) hours as needed for nausea.   quinapril 20 MG tablet Commonly known as: ACCUPRIL TAKE 1 AND 1/2 TABLETS BY MOUTH DAILY   rivaroxaban 20 MG Tabs tablet Commonly known as: Xarelto Take 1 tablet (20 mg total) by mouth daily.   traMADol 50 MG tablet Commonly known as: Ultram Take 1 tablet (50 mg total) by mouth every 6 (six) hours as needed.       Allergies:  Allergies  Allergen Reactions  . Iodinated Diagnostic Agents Nausea Only    Hypotension Hypotension  . Other Nausea Only and Other (See Comments)    Contrast Dye-drops blood pressure. Contrast Dye- hypotension and nausa and syncope    Family History: Family History  Problem Relation Age of Onset  . Heart attack Mother   . Heart failure Father   . Angina Father   . Emphysema Father   . Cancer Father        prostate  . Lymphoma Sister        Non-Hodgkins  . Cancer Other   . Coronary artery disease Other        brother  . Heart attack Brother   . Heart disease Maternal Aunt   . Colon polyps Sister   . Anxiety disorder Brother     Social History:  reports that he has quit smoking. His smoking use included cigarettes. He has a 20.00  pack-year smoking history. He has never used smokeless tobacco. He reports that he does not drink alcohol and does not use drugs.   Physical Exam: BP (!) 155/76   Pulse 73   Ht 5\' 8"  (1.727 m)   Wt 202 lb (91.6 kg)   BMI 30.71 kg/m   Constitutional:  Alert and oriented, No acute distress. HEENT: [place] AT, moist mucus membranes.  Trachea midline, no masses. Cardiovascular: No clubbing, cyanosis, or edema. Respiratory: Normal respiratory effort, no increased work of breathing. GU: Prostate 40 g, smooth without nodules Skin: No rashes, bruises or suspicious lesions. Neurologic:  Grossly intact, no focal deficits, moving all 4 extremities. Psychiatric: Normal mood and affect.   Assessment & Plan:    1. Benign prostatic hyperplasia with urinary frequency  Stable LUTS on finasteride  Refill sent to pharmacy  Bladder scan PVR improved at 63 mL  Continue annual follow-up   Abbie Sons, MD  [facility] [address] [place], [place] [address] [phone]

## 2020-04-15 ENCOUNTER — Telehealth: Payer: Self-pay | Admitting: Cardiovascular Disease

## 2020-04-15 ENCOUNTER — Telehealth: Payer: Self-pay

## 2020-04-15 NOTE — Telephone Encounter (Signed)
Patient calling to let us know he will be bringing med assistance forms for md completion.

## 2020-04-15 NOTE — Telephone Encounter (Signed)
Completed form for PA for Xarelto faxed at this time. Dr. Rockey Situ has signed, pt has insurance info, proof of income, and out-of-pocket documentation as well.

## 2020-05-20 ENCOUNTER — Other Ambulatory Visit (HOSPITAL_COMMUNITY): Payer: Self-pay | Admitting: Cardiovascular Disease

## 2020-05-20 DIAGNOSIS — I6523 Occlusion and stenosis of bilateral carotid arteries: Secondary | ICD-10-CM

## 2020-06-03 ENCOUNTER — Other Ambulatory Visit: Payer: Self-pay | Admitting: Cardiovascular Disease

## 2020-06-18 DIAGNOSIS — E1143 Type 2 diabetes mellitus with diabetic autonomic (poly)neuropathy: Secondary | ICD-10-CM | POA: Diagnosis not present

## 2020-06-18 DIAGNOSIS — E782 Mixed hyperlipidemia: Secondary | ICD-10-CM | POA: Diagnosis not present

## 2020-06-18 DIAGNOSIS — I10 Essential (primary) hypertension: Secondary | ICD-10-CM | POA: Diagnosis not present

## 2020-06-18 DIAGNOSIS — Z862 Personal history of diseases of the blood and blood-forming organs and certain disorders involving the immune mechanism: Secondary | ICD-10-CM | POA: Diagnosis not present

## 2020-06-18 NOTE — Telephone Encounter (Signed)
Was able to return Kevin Choi's phone call, updated him on phone conversation with PA rep [name] from [employer] (see earlier phone encounter). Kevin Choi verbalized understanding and is grateful for this RN for reaching out to PA for update. Apologize for the delay in response and the fact of unable to give rationale of [employer] delay processing. Advised will call back soon as response is faxed over with the approval or denial of PA.   Pt requesting sample, samples will be available for pt to pick up tomorrow when he arrives for his carotid US.   Xarelto 20 mg  4 bottles  Lot: [id number]  Exp: 05/2022

## 2020-06-18 NOTE — Telephone Encounter (Signed)
Patient is calling to get an update on his application for Xarelto

## 2020-06-18 NOTE — Telephone Encounter (Signed)
Called and spoke with [name] with PA program for Xarelto, she stated they received pt's completed fax on 04/16/2020 with all required documentation. [name] stated unsure why the processing was not completed, no notes was seen in pt's file. She advised she sent the application over to processing as "High Priority" and they will give a determination of approve or denial by the end of the day or tomorrow. [name] will send results by fax to office.

## 2020-06-19 ENCOUNTER — Other Ambulatory Visit: Payer: Self-pay

## 2020-06-19 ENCOUNTER — Ambulatory Visit (INDEPENDENT_AMBULATORY_CARE_PROVIDER_SITE_OTHER): Payer: PPO

## 2020-06-19 ENCOUNTER — Other Ambulatory Visit: Payer: Self-pay | Admitting: Cardiovascular Disease

## 2020-06-19 DIAGNOSIS — Z9889 Other specified postprocedural states: Secondary | ICD-10-CM

## 2020-06-19 DIAGNOSIS — I6523 Occlusion and stenosis of bilateral carotid arteries: Secondary | ICD-10-CM | POA: Diagnosis not present

## 2020-06-23 ENCOUNTER — Telehealth: Payer: Self-pay

## 2020-06-23 NOTE — Telephone Encounter (Signed)
Received fax from [facility] for Xarelto was denied d/t not "meeting the program's eligibility requirements" PA did not list what criteria Mr. Yon did not meet.   Will forward to pharmD to see if they can assist pt or if there is a solution to getting pt his medication.

## 2020-06-23 NOTE — Telephone Encounter (Signed)
Sorry I realize now this was a denial for patient assistance. [facility] [employer] [facility] patient assistance does not cover patient who have insurance. They do offer another program called [employer] which provides Xarelto for $85/ month plus tax or $240/ 90 days. If patients copay is higher than 85/month or 240/90 days then he would benefit from enrolling in this program.  Here is the link  [email or web address] Or can call [phone]

## 2020-06-23 NOTE — Telephone Encounter (Signed)
Pt was sent MyChart message regarding his Xarelto as there has been pervious conversation regarding his PA with Xarelto. Advised in message what PharmD suggested on reaching out to [employer].   If unable to afford or get assistance with Xarelto, will reach out to Dr. Rockey Situ for alternative.

## 2020-06-23 NOTE — Telephone Encounter (Signed)
PA for xarelto, denial letter faxed to PhmarD for review.  Record ID: [id number]

## 2020-06-23 NOTE — Telephone Encounter (Signed)
Please fax denial to [phone]- that might mean that patient doesn't have active coverage under that plan, but I will look into it

## 2020-07-02 ENCOUNTER — Ambulatory Visit: Payer: PPO | Admitting: Dermatology

## 2020-07-18 ENCOUNTER — Other Ambulatory Visit: Payer: Self-pay | Admitting: Cardiovascular Disease

## 2020-07-20 NOTE — Progress Notes (Signed)
Cardiology Office Note  Date:  07/21/2020   ID:  Kevin Choi, DOB [birth year], MRN [id number]  PCP:  [name], MD   Chief Complaint  Patient presents with  . 6 month follow up     Patient c/o LE edema & shortness of breath with over exertion. Medications reviewed by the patient verbally.     HPI:  80 year old gentleman with a history of  coronary artery disease,  bypass surgery with a LIMA to the LAD in 2002,  vein graft to the diagonal,  diabetes,  hypertension,  hyperlipidemia,  pleural effusion requiring a VATS,  carotid endarterectomy on the right in 2001,  TIA symptoms 3 times, once in 2002, October 2012 in March 2013. Status post bilateral total knee replacements 40% left carotid arterial disease resection of right frontal meningioma. On xarelto for TIAs who presents for routine followup of his coronary artery disease ,  leg swelling and shortness of breath.  LOV 12/2019 Weight stable 202 to 205 At that time reported high fluid intake, eating out frequently Leg swelling despite Lasix 40 twice daily  Last stress test April 2018 Last echocardiogram May 2019 ejection fraction at that time 40 to 45%  In follow-up today blood pressures have been relatively well controlled, but has a list of his numbers over the past 6 months In general 017-7 30 systolic Occasional 939 systolic, occasional 030 systolic Continues to have lower extremity edema, states he is taking Lasix 40 twice daily Edema is pitting, also with some shortness of breath on exertion Pain left leg, particularly with exertion, seems to be at rest Occasional tightness across the chest when overexerting No regular exercise program  Lab work reviewed HAB1C 7.0 Total chol 136 LDL 59  EKG personally reviewed by myself on todays visit Shows normal sinus rhythm rate 77 bpm left bundle branch block  Other past medical history reviewed Numb left arm, 03/2019 Left hands fingers not moving well going  through folders Numbness got worse Left lip numb Lasted 45 min, then resolved without intervention No further episodes, etiology unclear  Previous MRI/MRA from 2019 reviewed Meningioma noted, chronic small vessel disease noted  05/2019 Carotid u/s, results reviewed left ICA are consistent with a 40-59% stenosis. Right is open, s/p CEA  Echo 2019 - Left ventricle: The cavity size was normal. There was moderate  concentric hypertrophy. Systolic function was mildly to  moderately reduced. The estimated ejection fraction was in the  range of 40% to 45%.   Previous hearing problem,  led to MRI of the brain Discovery of mass right side of the brain Evaluated at [facility] found to have meningioma Had resection  04/21/2017 surgery resection     May 14 2011  he had acute onset of slurred speech. He was playing cards shortly after and he was unable to do any math. Symptoms lasted for approximately one to 2 hours. Blood pressure prior to this event had been well controlled though during and after the event, he had severe hypertension with systolic pressures sometimes greater than 200. He was in the emergency room for 2 days with extensive testing.    PMH:   has a past medical history of Allergic rhinitis due to pollen, CAD (coronary artery disease) (2002), Chicken pox, Diabetes ([place]), Diabetes mellitus, Dysplastic nevus (01/25/2008), Elevated prostate specific antigen (PSA) (02/21/2001), Headache(784.0), Hemiplegic migraine, without mention of intractable migraine without mention of status migrainosus, HLD (hyperlipidemia), HTN (hypertension) (05/08/2007), Hyperhidrosis, Impotence of organic origin (07/24/2007), Leg pain, Measles,  Mumps, Nausea with vomiting, Neuropathy, Occlusion and stenosis of carotid artery without mention of cerebral infarction (09/15/2006), Peripheral neuropathy (01/31/2008), Peripheral vascular disease ([place]), Personal history of urinary calculi, Pleural effusion  (2007), Restless legs syndrome (RLS) (01/10/2007), Slurred speech, and TIA (transient ischemic attack).  PSH:    Past Surgical History:  Procedure Laterality Date  . CAROTID ENDARTERECTOMY  2001   right  . CATHETER REMOVAL Right 06/03/2019  . CHOLECYSTECTOMY  2003  . collapse lung  2007  . CORONARY ARTERY BYPASS GRAFT  2001  . KNEE SURGERY Left 1981  . LUNG SURGERY  2007   Left; decortication left lung for persistant pleural effusion and PTX. admission ten days at [facility].  [name] PAROTID GLAND TUMOR EXCISION  1973  . VASECTOMY      Current Outpatient Medications  Medication Sig Dispense Refill  . acetaminophen (TYLENOL) 500 MG tablet Take 500 mg by mouth every 6 (six) hours as needed. Patient states he can take 2 tablets daily as needed for pain    . atorvastatin (LIPITOR) 40 MG tablet TAKE 1 TABLET BY MOUTH ONCE A DAY 90 tablet 3  . cetirizine (ZYRTEC) 10 MG tablet Take 10 mg by mouth as needed.     . finasteride (PROSCAR) 5 MG tablet Take 1 tablet (5 mg total) by mouth daily. 90 tablet 3  . furosemide (LASIX) 40 MG tablet TAKE 1 TABLET BY MOUTH TWICE A DAY WITH AN EXTRA AS NEEDED FOR SWELLING. 270 tablet 3  . gabapentin (NEURONTIN) 300 MG capsule Take 300 mg by mouth 3 (three) times daily.    [name] glucose blood test strip     . hydrALAZINE (APRESOLINE) 100 MG tablet TAKE 1 TABLET BY MOUTH 3 TIMES DAILY 270 tablet 0  . ketoconazole (NIZORAL) 2 % shampoo Apply to scalp as directed. Leave in for 10 minutes before rinsing. 120 mL 2  . magnesium gluconate (MAGONATE) 500 MG tablet Take 500 mg by mouth daily.    . metFORMIN (GLUCOPHAGE) 500 MG tablet Take 500 mg by mouth 2 (two) times daily with a meal.    . metoprolol succinate (TOPROL-XL) 50 MG 24 hr tablet TAKE 1 TABLET BY MOUTH DAILY. TAKE WITH OR IMMEDIATELY FOLLOWING A MEAL 90 tablet 3  . Multiple Vitamin (MULTI-VITAMIN) tablet Take by mouth.    . nitroGLYCERIN (NITROSTAT) 0.4 MG SL tablet Place 1 tablet (0.4 mg total) under the tongue every  5 (five) minutes as needed for chest pain. 25 tablet 1  . pramipexole (MIRAPEX) 0.25 MG tablet Take 1 tablet (0.25 mg total) by mouth 3 (three) times daily. 270 tablet 3  . promethazine (PHENERGAN) 12.5 MG tablet Take 1 tablet (12.5 mg total) by mouth every 8 (eight) hours as needed for nausea. 20 tablet 0  . quinapril (ACCUPRIL) 20 MG tablet TAKE 1 AND 1/2 TABLETS BY MOUTH DAILY 135 tablet 1  . Rivaroxaban (XARELTO) 20 MG TABS tablet Take 1 tablet (20 mg total) by mouth daily. 90 tablet 3  . traMADol (ULTRAM) 50 MG tablet Take 1 tablet (50 mg total) by mouth every 6 (six) hours as needed. 15 tablet 0  . triamcinolone lotion (KENALOG) 0.1 % Apply 1 application topically 2 (two) times daily.     No current facility-administered medications for this visit.     Allergies:   Iodinated diagnostic agents and Other   Social History:  The patient  reports that he has quit smoking. His smoking use included cigarettes. He has a 20.00 pack-year smoking history.  He has never used smokeless tobacco. He reports that he does not drink alcohol and does not use drugs.   Family History:   family history includes Angina in his father; Anxiety disorder in his brother; Cancer in his father and another family member; Colon polyps in his sister; Coronary artery disease in an other family member; Emphysema in his father; Heart attack in his brother and mother; Heart disease in his maternal aunt; Heart failure in his father; Lymphoma in his sister.    Review of Systems: Review of Systems  Constitutional: Negative.   HENT: Negative.   Respiratory: Positive for shortness of breath.   Cardiovascular: Positive for leg swelling.  Gastrointestinal: Negative.   Musculoskeletal: Negative.   Neurological: Negative.   Psychiatric/Behavioral: Negative.   All other systems reviewed and are negative.   PHYSICAL EXAM: VS:  BP 140/62 (BP Location: Left Arm, Patient Position: Sitting, Cuff Size: Normal)   Pulse (!) 58    Ht 5' 8.5" (1.74 m)   Wt 205 lb (93 kg)   SpO2 98%   BMI 30.72 kg/m  , BMI Body mass index is 30.72 kg/m. Constitutional:  oriented to person, place, and time. No distress.  HENT:  Head: Grossly normal Eyes:  no discharge. No scleral icterus.  Neck: No JVD, no carotid bruits  Cardiovascular: Regular rate and rhythm, no murmurs appreciated Pulmonary/Chest: Clear to auscultation bilaterally, no wheezes or rails Abdominal: Soft.  no distension.  no tenderness.  Musculoskeletal: Normal range of motion Neurological:  normal muscle tone. Coordination normal. No atrophy Skin: Skin warm and dry Psychiatric: normal affect, pleasant  Recent Labs: No results found for requested labs within last 8760 hours.    Lipid Panel Lab Results  Component Value Date   CHOL 147 06/06/2013   HDL 37.90 (L) 06/06/2013   LDLCALC 61 06/06/2013   TRIG 243.0 (H) 06/06/2013      Wt Readings from Last 3 Encounters:  07/21/20 205 lb (93 kg)  04/03/20 202 lb (91.6 kg)  01/15/20 210 lb (95.3 kg)     ASSESSMENT AND PLAN:  Essential hypertension -  Blood pressure in reasonable range Diuretic adjustment as below  Chronic systolic and diastolic CHF High fluid intake, eats out frequently Will hold the Lasix transition to torsemide 40 in the morning 20 in the afternoon Recommended for weight 195 or less that he go to torsemide 20 twice daily Significant pitting edema up to the mid shins, discussed with him in detail Suspect goal weight 197 We did discuss prior echocardiogram 3 years ago, recommended repeat, he has declined at this time  Shortness of breath Likely multifactorial including obesity, deconditioning, fluid retention/pulmonary edema  recommend regular walking program Changed to diuretic regimen as above given continued lower extremity edema  Atherosclerosis of native coronary artery with stable angina Ejection fraction 40-45%, 2019 Previous stress test 2018 with ejection fraction 47%   cholesterol at goal, diabetes numbers at goal Unclear if he is having anginal symptoms or shortness of breath and fluid and deconditioning Stress test was discussed, he will call us if he would like to order this  Carotid disease Stable, s/p CEA  Numbness left arm Prior history of TIAs He does have chronic small vessel disease Managed with Xarelto  Hyperlipidemia Cholesterol is at goal on the current lipid regimen. No changes to the medications were made.  Stable  Diabetes type 2 with complications Recommend continued dietary discretion, less eating out,  Recommend walking program   Total encounter time more  than 25 minutes  Greater than 50% was spent in counseling and coordination of care with the patient    No orders of the defined types were placed in this encounter.    Signed, Esmond Plants, M.D., Ph.D. 07/21/2020  [place], [place]

## 2020-07-21 ENCOUNTER — Ambulatory Visit: Payer: PPO | Admitting: Cardiovascular Disease

## 2020-07-21 ENCOUNTER — Other Ambulatory Visit: Payer: Self-pay

## 2020-07-21 ENCOUNTER — Encounter: Payer: Self-pay | Admitting: Cardiovascular Disease

## 2020-07-21 VITALS — BP 140/62 | HR 58 | Ht 68.5 in | Wt 205.0 lb

## 2020-07-21 DIAGNOSIS — E782 Mixed hyperlipidemia: Secondary | ICD-10-CM | POA: Diagnosis not present

## 2020-07-21 DIAGNOSIS — E1143 Type 2 diabetes mellitus with diabetic autonomic (poly)neuropathy: Secondary | ICD-10-CM | POA: Diagnosis not present

## 2020-07-21 DIAGNOSIS — Z79899 Other long term (current) drug therapy: Secondary | ICD-10-CM

## 2020-07-21 DIAGNOSIS — I6523 Occlusion and stenosis of bilateral carotid arteries: Secondary | ICD-10-CM

## 2020-07-21 DIAGNOSIS — I10 Essential (primary) hypertension: Secondary | ICD-10-CM | POA: Diagnosis not present

## 2020-07-21 DIAGNOSIS — I25118 Atherosclerotic heart disease of native coronary artery with other forms of angina pectoris: Secondary | ICD-10-CM | POA: Diagnosis not present

## 2020-07-21 DIAGNOSIS — I739 Peripheral vascular disease, unspecified: Secondary | ICD-10-CM | POA: Diagnosis not present

## 2020-07-21 MED ORDER — TORSEMIDE 20 MG PO TABS: 40.0000 mg | ORAL_TABLET | Freq: Two times a day (BID) | ORAL | 3 refills | Status: DC

## 2020-07-21 NOTE — Patient Instructions (Addendum)
Call if leg pain gets worse, We could order a lower extremity arterial doppler   Medication Instructions:  STOP Lasix  START Torsemide  40 mg in the Am, 20 mg in the PM  Decrease the torsemide down to 20 mg twice a day   for weight 195 or less  If you need a refill on your cardiac medications before your next appointment, please call your pharmacy.   One free bottle sample of Xarelto 20 mg (you was given 4 bottles in April, cannot supple anymore)  Lot: [id number]  Exp: 04/24  Lab work: Bmp in 2 -3 weeks  Walk into medical mall at the check in desk, they will direct you to lab registration, hours for labs are Monday-Friday 07:00am-5:30pm (no appointment necessary)  Testing/Procedures: No new testing needed   Follow-Up: . You will need a follow up appointment in 3 months  . Providers on your designated Care Team:   . Murray Hodgkins, NP . Christell Faith, PA-C . Marrianne Mood, PA-C  COVID-19 Vaccine Information can be found at: ShippingScam.co.uk For questions related to vaccine distribution or appointments, please email [email or web address] or call [phone].

## 2020-07-22 NOTE — Addendum Note (Signed)
Addended by: Anselm Pancoast on: 07/22/2020 02:25 PM   Modules accepted: Orders

## 2020-08-25 DIAGNOSIS — M9904 Segmental and somatic dysfunction of sacral region: Secondary | ICD-10-CM | POA: Diagnosis not present

## 2020-08-25 DIAGNOSIS — M9903 Segmental and somatic dysfunction of lumbar region: Secondary | ICD-10-CM | POA: Diagnosis not present

## 2020-08-25 DIAGNOSIS — M461 Sacroiliitis, not elsewhere classified: Secondary | ICD-10-CM | POA: Diagnosis not present

## 2020-08-25 DIAGNOSIS — M5442 Lumbago with sciatica, left side: Secondary | ICD-10-CM | POA: Diagnosis not present

## 2020-08-26 DIAGNOSIS — M461 Sacroiliitis, not elsewhere classified: Secondary | ICD-10-CM | POA: Diagnosis not present

## 2020-08-26 DIAGNOSIS — M9903 Segmental and somatic dysfunction of lumbar region: Secondary | ICD-10-CM | POA: Diagnosis not present

## 2020-08-26 DIAGNOSIS — M9904 Segmental and somatic dysfunction of sacral region: Secondary | ICD-10-CM | POA: Diagnosis not present

## 2020-08-26 DIAGNOSIS — M5442 Lumbago with sciatica, left side: Secondary | ICD-10-CM | POA: Diagnosis not present

## 2020-08-27 DIAGNOSIS — M461 Sacroiliitis, not elsewhere classified: Secondary | ICD-10-CM | POA: Diagnosis not present

## 2020-08-27 DIAGNOSIS — M9904 Segmental and somatic dysfunction of sacral region: Secondary | ICD-10-CM | POA: Diagnosis not present

## 2020-08-27 DIAGNOSIS — M5442 Lumbago with sciatica, left side: Secondary | ICD-10-CM | POA: Diagnosis not present

## 2020-08-27 DIAGNOSIS — M9903 Segmental and somatic dysfunction of lumbar region: Secondary | ICD-10-CM | POA: Diagnosis not present

## 2020-08-31 ENCOUNTER — Other Ambulatory Visit: Payer: Self-pay | Admitting: Cardiovascular Disease

## 2020-08-31 DIAGNOSIS — M5442 Lumbago with sciatica, left side: Secondary | ICD-10-CM | POA: Diagnosis not present

## 2020-08-31 DIAGNOSIS — M9904 Segmental and somatic dysfunction of sacral region: Secondary | ICD-10-CM | POA: Diagnosis not present

## 2020-08-31 DIAGNOSIS — M9903 Segmental and somatic dysfunction of lumbar region: Secondary | ICD-10-CM | POA: Diagnosis not present

## 2020-08-31 DIAGNOSIS — M461 Sacroiliitis, not elsewhere classified: Secondary | ICD-10-CM | POA: Diagnosis not present

## 2020-09-03 DIAGNOSIS — M9903 Segmental and somatic dysfunction of lumbar region: Secondary | ICD-10-CM | POA: Diagnosis not present

## 2020-09-03 DIAGNOSIS — M5442 Lumbago with sciatica, left side: Secondary | ICD-10-CM | POA: Diagnosis not present

## 2020-09-03 DIAGNOSIS — M461 Sacroiliitis, not elsewhere classified: Secondary | ICD-10-CM | POA: Diagnosis not present

## 2020-09-03 DIAGNOSIS — M9904 Segmental and somatic dysfunction of sacral region: Secondary | ICD-10-CM | POA: Diagnosis not present

## 2020-09-07 DIAGNOSIS — M9904 Segmental and somatic dysfunction of sacral region: Secondary | ICD-10-CM | POA: Diagnosis not present

## 2020-09-07 DIAGNOSIS — M9903 Segmental and somatic dysfunction of lumbar region: Secondary | ICD-10-CM | POA: Diagnosis not present

## 2020-09-07 DIAGNOSIS — M461 Sacroiliitis, not elsewhere classified: Secondary | ICD-10-CM | POA: Diagnosis not present

## 2020-09-07 DIAGNOSIS — M5442 Lumbago with sciatica, left side: Secondary | ICD-10-CM | POA: Diagnosis not present

## 2020-09-30 ENCOUNTER — Ambulatory Visit: Payer: PPO | Admitting: Dermatology

## 2020-10-08 DIAGNOSIS — E6609 Other obesity due to excess calories: Secondary | ICD-10-CM | POA: Diagnosis not present

## 2020-10-08 DIAGNOSIS — I10 Essential (primary) hypertension: Secondary | ICD-10-CM | POA: Diagnosis not present

## 2020-10-08 DIAGNOSIS — Z6832 Body mass index (BMI) 32.0-32.9, adult: Secondary | ICD-10-CM | POA: Diagnosis not present

## 2020-10-08 DIAGNOSIS — Z862 Personal history of diseases of the blood and blood-forming organs and certain disorders involving the immune mechanism: Secondary | ICD-10-CM | POA: Diagnosis not present

## 2020-10-08 DIAGNOSIS — I5022 Chronic systolic (congestive) heart failure: Secondary | ICD-10-CM | POA: Diagnosis not present

## 2020-10-08 DIAGNOSIS — E1143 Type 2 diabetes mellitus with diabetic autonomic (poly)neuropathy: Secondary | ICD-10-CM | POA: Diagnosis not present

## 2020-10-08 DIAGNOSIS — I2510 Atherosclerotic heart disease of native coronary artery without angina pectoris: Secondary | ICD-10-CM | POA: Diagnosis not present

## 2020-10-08 DIAGNOSIS — E782 Mixed hyperlipidemia: Secondary | ICD-10-CM | POA: Diagnosis not present

## 2020-10-09 DIAGNOSIS — Z0000 Encounter for general adult medical examination without abnormal findings: Secondary | ICD-10-CM | POA: Diagnosis not present

## 2020-10-09 DIAGNOSIS — E782 Mixed hyperlipidemia: Secondary | ICD-10-CM | POA: Diagnosis not present

## 2020-10-09 DIAGNOSIS — Z862 Personal history of diseases of the blood and blood-forming organs and certain disorders involving the immune mechanism: Secondary | ICD-10-CM | POA: Diagnosis not present

## 2020-10-09 DIAGNOSIS — Z6831 Body mass index (BMI) 31.0-31.9, adult: Secondary | ICD-10-CM | POA: Diagnosis not present

## 2020-10-09 DIAGNOSIS — I10 Essential (primary) hypertension: Secondary | ICD-10-CM | POA: Diagnosis not present

## 2020-10-09 DIAGNOSIS — E1143 Type 2 diabetes mellitus with diabetic autonomic (poly)neuropathy: Secondary | ICD-10-CM | POA: Diagnosis not present

## 2020-10-09 DIAGNOSIS — E6609 Other obesity due to excess calories: Secondary | ICD-10-CM | POA: Diagnosis not present

## 2020-10-09 DIAGNOSIS — I5022 Chronic systolic (congestive) heart failure: Secondary | ICD-10-CM | POA: Diagnosis not present

## 2020-10-09 DIAGNOSIS — D329 Benign neoplasm of meninges, unspecified: Secondary | ICD-10-CM | POA: Diagnosis not present

## 2020-10-09 DIAGNOSIS — N289 Disorder of kidney and ureter, unspecified: Secondary | ICD-10-CM | POA: Diagnosis not present

## 2020-10-20 NOTE — Progress Notes (Signed)
Cardiology Office Note  Date:  10/21/2020   ID:  Kevin Choi, DOB November 02, 1940, MRN [id number]  PCP:  [name], MD   Chief Complaint  Patient presents with   3 month follow up     "Patient c/o LE edema with more on the left ankle. Medications reviewed by the patient verbally.     HPI:  80 year old gentleman with a history of  coronary artery disease,  bypass surgery with a LIMA to the LAD in 2002,  vein graft to the diagonal,  diabetes,  hypertension,  hyperlipidemia,  pleural effusion requiring a VATS,  carotid endarterectomy on the right in 2001,  TIA symptoms 3 times, once in 2002, October 2012 in March 2013. Status post bilateral total knee replacements 40% left carotid arterial disease resection of right frontal meningioma. On xarelto for TIAs who presents for routine followup of his coronary artery disease ,  leg swelling and  shortness of breath.  On prior clinic visit was prescribed torsemide for leg swelling Reports this did not work very well, went back to Lasix Currently taking lasix 40 milligrams twice a day since July 2022 More swelling on left than right Pain in lateral left calf No regular exercise program  Eating out frequently Weight low end of his range  Long discussion concerning blood pressures Frequent low blood pressures sometimes in the 90 systolic range, [id number], [id number] up to 109 other pressures 1 teens, 120  Lab work reviewed Total chol 118, LDL 48 A1C 7.1  EKG personally reviewed by myself on todays visit Shows normal sinus rhythm rate 57 bpm left bundle branch block  Other past medical history reviewed Numb left arm, 03/2019 Left hands fingers not moving well going through folders Numbness got worse Left lip numb Lasted 45 min, then resolved without intervention No further episodes, etiology unclear  Previous MRI/MRA from 2019 reviewed Meningioma noted, chronic small vessel disease noted  05/2019 Carotid u/s, results  reviewed left ICA are consistent with a 40-59% stenosis. Right is open, s/p CEA  Echo 2019 - Left ventricle: The cavity size was normal. There was moderate    concentric hypertrophy. Systolic function was mildly to    moderately reduced. The estimated ejection fraction was in the    range of 40% to 45%.   Previous hearing problem,  led to MRI of the brain Discovery of mass right side of the brain Evaluated at [facility] found to have meningioma Had resection  04/21/2017 surgery resection     May 14 2011  he had acute onset of slurred speech. He was playing cards shortly after and he was unable to do any math. Symptoms lasted for approximately one to 2 hours. Blood pressure prior to this event had been well controlled though during and after the event, he had severe hypertension with systolic pressures sometimes greater than 200. He was in the emergency room for 2 days with extensive testing.   PMH:   has a past medical history of Allergic rhinitis due to pollen, CAD (coronary artery disease) (2002), Chicken pox, Diabetes ([place]), Diabetes mellitus, Dysplastic nevus (01/25/2008), Elevated prostate specific antigen (PSA) (02/21/2001), Headache(784.0), Hemiplegic migraine, without mention of intractable migraine without mention of status migrainosus, HLD (hyperlipidemia), HTN (hypertension) (05/08/2007), Hyperhidrosis, Impotence of organic origin (07/24/2007), Leg pain, Measles, Mumps, Nausea with vomiting, Neuropathy, Occlusion and stenosis of carotid artery without mention of cerebral infarction (09/15/2006), Peripheral neuropathy (01/31/2008), Peripheral vascular disease ([place]), Personal history of urinary calculi, Pleural effusion (2007), Restless legs syndrome (  RLS) (01/10/2007), Slurred speech, and TIA (transient ischemic attack).  PSH:    Past Surgical History:  Procedure Laterality Date   CAROTID ENDARTERECTOMY  2001   right   CATHETER REMOVAL Right 06/03/2019   CHOLECYSTECTOMY  2003    collapse lung  2007   CORONARY ARTERY BYPASS GRAFT  2001   KNEE SURGERY Left 1981   LUNG SURGERY  2007   Left; decortication left lung for persistant pleural effusion and PTX. admission ten [facility] at [facility].   PAROTID GLAND TUMOR EXCISION  1973   VASECTOMY      Current Outpatient Medications  Medication Sig Dispense Refill   acetaminophen (TYLENOL) 500 MG tablet Take 500 mg by mouth every 6 (six) hours as needed. Patient states he can take 2 tablets daily as needed for pain     cetirizine (ZYRTEC) 10 MG tablet Take 10 mg by mouth as needed.      finasteride (PROSCAR) 5 MG tablet Take 1 tablet (5 mg total) by mouth daily. 90 tablet 3   gabapentin (NEURONTIN) 300 MG capsule Take 300 mg by mouth 3 (three) times daily.     glucose blood test strip      hydrALAZINE (APRESOLINE) 50 MG tablet Take 1 tablet (50 mg total) by mouth 3 (three) times daily. 270 tablet 3   ketoconazole (NIZORAL) 2 % shampoo Apply to scalp as directed. Leave in for 10 minutes before rinsing. 120 mL 2   magnesium gluconate (MAGONATE) 500 MG tablet Take 500 mg by mouth daily.     metFORMIN (GLUCOPHAGE) 500 MG tablet Take 500 mg by mouth 2 (two) times daily with a meal.     Multiple Vitamin (MULTI-VITAMIN) tablet Take by mouth.     pramipexole (MIRAPEX) 0.25 MG tablet Take 1 tablet (0.25 mg total) by mouth 3 (three) times daily. 270 tablet 3   promethazine (PHENERGAN) 12.5 MG tablet Take 1 tablet (12.5 mg total) by mouth every 8 (eight) hours as needed for nausea. 20 tablet 0   traMADol (ULTRAM) 50 MG tablet Take 1 tablet (50 mg total) by mouth every 6 (six) hours as needed. 15 tablet 0   triamcinolone lotion (KENALOG) 0.1 % Apply 1 application topically 2 (two) times daily.     atorvastatin (LIPITOR) 40 MG tablet Take 1 tablet (40 mg total) by mouth daily. 90 tablet 3   furosemide (LASIX) 20 MG tablet Take 2 tablets (40 mg total) by mouth 2 (two) times daily. 90 tablet 3   metoprolol succinate (TOPROL-XL) 50 MG 24 hr tablet  Take 1 tablet (50 mg total) by mouth daily. TAKE WITH OR IMMEDIATELY FOLLOWING A MEAL. 90 tablet 3   nitroGLYCERIN (NITROSTAT) 0.4 MG SL tablet Place 1 tablet (0.4 mg total) under the tongue every 5 (five) minutes as needed for chest pain. 25 tablet 3   quinapril (ACCUPRIL) 20 MG tablet Take 1.5 tablets (30 mg total) by mouth daily. 135 tablet 3   rivaroxaban (XARELTO) 20 MG TABS tablet Take 1 tablet (20 mg total) by mouth daily. 90 tablet 3   No current facility-administered medications for this visit.     Allergies:   Iodinated diagnostic agents and Other   Social History:  The patient  reports that he has quit smoking. His smoking use included cigarettes. He has a 20.00 pack-year smoking history. He has never used smokeless tobacco. He reports that he does not drink alcohol and does not use drugs.   Family History:   family history includes Angina  in his father; Anxiety disorder in his brother; Cancer in his father and another family member; Colon polyps in his sister; Coronary artery disease in an other family member; Emphysema in his father; Heart attack in his brother and mother; Heart disease in his maternal aunt; Heart failure in his father; Lymphoma in his sister.    Review of Systems: Review of Systems  Constitutional: Negative.   HENT: Negative.    Respiratory:  Positive for shortness of breath.   Cardiovascular:  Positive for leg swelling.  Gastrointestinal: Negative.   Musculoskeletal: Negative.   Neurological: Negative.   Psychiatric/Behavioral: Negative.    All other systems reviewed and are negative.  PHYSICAL EXAM: VS:  BP (!) 100/52 (BP Location: Left Arm, Patient Position: Sitting, Cuff Size: Normal)   Pulse (!) 57   Ht 5' 8.5" (1.74 m)   Wt 200 lb 4 oz (90.8 kg)   SpO2 98%   BMI 30.01 kg/m  , BMI Body mass index is 30.01 kg/m. Constitutional:  oriented to person, place, and time. No distress.  HENT:  Head: Grossly normal Eyes:  no discharge. No scleral  icterus.  Neck: No JVD, no carotid bruits  Cardiovascular: Regular rate and rhythm, no murmurs appreciated Pulmonary/Chest: Clear to auscultation bilaterally, no wheezes or rails Abdominal: Soft.  no distension.  no tenderness.  Musculoskeletal: Normal range of motion Neurological:  normal muscle tone. Coordination normal. No atrophy Skin: Skin warm and dry Psychiatric: normal affect, pleasant  Recent Labs: No results found for requested labs within last 8760 hours.    Lipid Panel Lab Results  Component Value Date   CHOL 147 06/06/2013   HDL 37.90 (L) 06/06/2013   LDLCALC 61 06/06/2013   TRIG 243.0 (H) 06/06/2013      Wt Readings from Last 3 Encounters:  10/21/20 200 lb 4 oz (90.8 kg)  07/21/20 205 lb (93 kg)  04/03/20 202 lb (91.6 kg)     ASSESSMENT AND PLAN:  Essential hypertension -  Blood pressure in reasonable range Diuretic adjustment as below  Chronic systolic and diastolic CHF High fluid intake, eats out frequently Did not feel torsemide worked well for him, he changed back to Lasix 40 twice daily He does not want metolazone even on as-needed basis Previously declined repeat echocardiogram Recommended moderating salt intake, fluid intake  Shortness of breath Likely multifactorial including obesity, deconditioning, fluid retention/pulmonary edema Recommended weight loss, walking program  Atherosclerosis of native coronary artery with stable angina Ejection fraction 40-45%, 2019 Previous stress test 2018 with ejection fraction 47%  cholesterol at goal, diabetes numbers at goal Has had no recent ischemic work-up Declined further echoes  Carotid disease Stable, s/p CEA  Numbness left arm Prior history of TIAs He does have chronic small vessel disease Managed with Xarelto  Hyperlipidemia Cholesterol is at goal on the current lipid regimen. No changes to the medications were made.  Diabetes type 2 with complications Recommend continued dietary  discretion, less eating out,  Recommend diet restriction, walking program   Total encounter time more than 25 minutes  Greater than 50% was spent in counseling and coordination of care with the patient    Orders Placed This Encounter  Procedures   EKG 12-Lead      Signed, Esmond Plants, M.D., Ph.D. 10/21/2020  [place], [place]

## 2020-10-21 ENCOUNTER — Other Ambulatory Visit: Payer: Self-pay

## 2020-10-21 ENCOUNTER — Encounter: Payer: Self-pay | Admitting: Cardiovascular Disease

## 2020-10-21 ENCOUNTER — Ambulatory Visit: Payer: PPO | Admitting: Cardiovascular Disease

## 2020-10-21 VITALS — BP 100/52 | HR 57 | Ht 68.5 in | Wt 200.2 lb

## 2020-10-21 DIAGNOSIS — E782 Mixed hyperlipidemia: Secondary | ICD-10-CM

## 2020-10-21 DIAGNOSIS — I6523 Occlusion and stenosis of bilateral carotid arteries: Secondary | ICD-10-CM

## 2020-10-21 DIAGNOSIS — E1143 Type 2 diabetes mellitus with diabetic autonomic (poly)neuropathy: Secondary | ICD-10-CM

## 2020-10-21 DIAGNOSIS — I739 Peripheral vascular disease, unspecified: Secondary | ICD-10-CM | POA: Diagnosis not present

## 2020-10-21 DIAGNOSIS — I10 Essential (primary) hypertension: Secondary | ICD-10-CM | POA: Diagnosis not present

## 2020-10-21 DIAGNOSIS — I25118 Atherosclerotic heart disease of native coronary artery with other forms of angina pectoris: Secondary | ICD-10-CM | POA: Diagnosis not present

## 2020-10-21 MED ORDER — METOPROLOL SUCCINATE ER 50 MG PO TB24: 50.0000 mg | ORAL_TABLET | Freq: Every day | ORAL | 3 refills | Status: DC

## 2020-10-21 MED ORDER — QUINAPRIL HCL 20 MG PO TABS: 30.0000 mg | ORAL_TABLET | Freq: Every day | ORAL | 3 refills | Status: DC

## 2020-10-21 MED ORDER — FUROSEMIDE 20 MG PO TABS: 40.0000 mg | ORAL_TABLET | Freq: Two times a day (BID) | ORAL | 3 refills | Status: DC

## 2020-10-21 MED ORDER — ATORVASTATIN CALCIUM 40 MG PO TABS: 40.0000 mg | ORAL_TABLET | Freq: Every day | ORAL | 3 refills | Status: DC

## 2020-10-21 MED ORDER — RIVAROXABAN 20 MG PO TABS: 20.0000 mg | ORAL_TABLET | Freq: Every day | ORAL | 3 refills | Status: DC

## 2020-10-21 MED ORDER — HYDRALAZINE HCL 50 MG PO TABS
50.0000 mg | ORAL_TABLET | Freq: Three times a day (TID) | ORAL | 3 refills | Status: DC
Start: 2020-10-21 — End: 2021-05-31

## 2020-10-21 MED ORDER — RIVAROXABAN 20 MG PO TABS: 20.0000 mg | ORAL_TABLET | Freq: Every day | ORAL | 1 refills | Status: DC

## 2020-10-21 MED ORDER — NITROGLYCERIN 0.4 MG SL SUBL: 0.4000 mg | SUBLINGUAL_TABLET | SUBLINGUAL | 3 refills | Status: DC | PRN

## 2020-10-21 NOTE — Telephone Encounter (Signed)
Xarelto 20 mg refill request received. Pt is 80 years old, weight- 90.8 kg, Crea- 1.2 on 10/08/20 via care everywhere, last seen by Dr. Rockey Situ today, Diagnosis-TIAs, CrCl- 62; Dose is appropriate based on dosing criteria. Will send in refill to requested pharmacy.   (Dr. Rockey Situ and Fuller Canada, RPh agree that dosage is appropriate for off-label tx for TIAs and to refill current dosage)

## 2020-10-21 NOTE — Patient Instructions (Addendum)
Medication Instructions:   Take extra lasix as needed for ankle swelling  Cut back on the hydralazine 50 mg three times a day  If you need a refill on your cardiac medications before your next appointment, please call your pharmacy.   Lab work: No new labs needed  Testing/Procedures: No new testing needed  Follow-Up: At [facility], you and your health needs are our priority.  As part of our continuing mission to provide you with exceptional heart care, we have created designated Provider Care Teams.  These Care Teams include your primary Cardiologist (physician) and Advanced Practice Providers (APPs -  Physician Assistants and Nurse Practitioners) who all work together to provide you with the care you need, when you need it.  You will need a follow up appointment in 12 months  Providers on your designated Care Team:   Murray Hodgkins, NP Christell Faith, PA-C Marrianne Mood, PA-C [place], [place]  COVID-19 Vaccine Information can be found at: [email or web address] For questions related to vaccine distribution or appointments, please email [email or web address] or call 7091740802.

## 2020-10-27 ENCOUNTER — Other Ambulatory Visit: Payer: Self-pay | Admitting: Cardiovascular Disease

## 2020-11-30 ENCOUNTER — Other Ambulatory Visit: Payer: Self-pay | Admitting: Cardiovascular Disease

## 2020-12-07 ENCOUNTER — Other Ambulatory Visit (HOSPITAL_BASED_OUTPATIENT_CLINIC_OR_DEPARTMENT_OTHER): Payer: Self-pay | Admitting: Cardiovascular Disease

## 2020-12-07 DIAGNOSIS — Z9889 Other specified postprocedural states: Secondary | ICD-10-CM

## 2020-12-30 ENCOUNTER — Telehealth: Payer: Self-pay | Admitting: Cardiovascular Disease

## 2020-12-30 NOTE — Telephone Encounter (Signed)
Was able to reach back out to Kevin Choi, he reports his swelling to bilateral feet have not gotten better since last OV with Dr. Rockey Situ on 10/21/2020.  Pt reports left foot is worse then the right "I do not even have ankles, they are so swollen". Reports very difficult to apply socks or shoes. Pt reports indention when pressing on his foot, denies any color changes, warmth, or pain. Has reported weight gain, was 198, in last 1.5 weeks weight is now 203-205 lbs.   Also, having pain to left calf, pain dose not increase while he flexs his foot. Denies swelling or warmth to left calf.  Pt reports has been taking Lasix 40 mg BID, however has forgotten Dr. Donivan Scull advise of taking "Take extra lasix as needed for ankle swelling"  Last OV notes Chronic systolic and diastolic CHF High fluid intake, eats out frequently Did not feel torsemide worked well for him, he changed back to Lasix 40 twice daily He does not want metolazone even on as-needed basis Previously declined repeat echocardiogram Recommended moderating salt intake, fluid intake  Pt would like to be seen, appt for 11/14 at 2:20 pm with Dr. Rockey Situ,  Until appt:  I have asked him to: - elevate his lower extremities when possible - use some compression socks if able - decrease his salt intake as he eats out "frequently" -reduce any free water - weight himself daily in the morning after getting up and voiding and record these weights -take extra lasix as needed  Kevin Choi thankful for calling him back and giving advise, will follow recommendations as stated above and will see Dr. Rockey Situ next Monday.

## 2020-12-30 NOTE — Telephone Encounter (Signed)
Attempted to reach back out to pt to f/u on his lef swelling Unable to make contact Will attempt again at later time [facility]

## 2020-12-30 NOTE — Telephone Encounter (Signed)
Patient returning call.

## 2020-12-30 NOTE — Telephone Encounter (Signed)
Pt c/o medication issue:  1. Name of Medication: Lasix  2. How are you currently taking this medication (dosage and times per day)? 40mg  2 tablets twice daily  3. Are you having a reaction (difficulty breathing--STAT)? Feet swelling and some in legd  4. What is your medication issue? Medication not working

## 2021-01-03 NOTE — Progress Notes (Signed)
Cardiology Office Note  Date:  01/04/2021   ID:  ARMEL RABBANI, DOB March 21, 1940, MRN [id number]  PCP:  [name], MD   Chief Complaint  Patient presents with   Weight Gain    Patient c/o LE edema & shortness of breath with little exertion. Medications reviewed by the patient verbally.      HPI:  80 year old gentleman with a history of  coronary artery disease,  bypass surgery with a LIMA to the LAD in 2002,  vein graft to the diagonal,  diabetes,  hypertension,  hyperlipidemia,  pleural effusion requiring a VATS,  carotid endarterectomy on the right in 2001,  TIA symptoms 3 times, once in 2002, October 2012 in March 2013. Status post bilateral total knee replacements 40% left carotid arterial disease resection of right frontal meningioma. On xarelto for TIAs EF 40 to 45% in 2019 who presents for routine followup of his coronary artery disease ,  leg swelling and  shortness of breath.  LOV 8/22 High fluid intake, eats out frequently Did not feel torsemide worked well for him, he changed back to Lasix 40 twice daily He was last visit, he did not does not want metolazone  Previously declined repeat echocardiogram  On todays visit, reports worsening ankle and leg swelling Continues to be [employer] Incontinence issues But reports he is taking Lasix 40 twice daily for leg swelling getting worse, now feet and legs are painful and swollen Difficulty getting his shoes on  Denies excessive fluid intake, couple cups of coffee, iced tea, some beverages at the restaurant  Lab work reviewed Total chol 118, LDL 48 A1C 7.1  EKG personally reviewed by myself on todays visit Shows normal sinus rhythm rate 68 bpm left bundle branch block  Other past medical history reviewed Numb left arm, 03/2019 Left hands fingers not moving well going through folders Numbness got worse Left lip numb Lasted 45 min, then resolved without intervention No further episodes,  etiology unclear  Previous MRI/MRA from 2019 reviewed Meningioma noted, chronic small vessel disease noted  05/2019 Carotid u/s, results reviewed left ICA are consistent with a 40-59% stenosis. Right is open, s/p CEA  Echo 2019 - Left ventricle: The cavity size was normal. There was moderate    concentric hypertrophy. Systolic function was mildly to    moderately reduced. The estimated ejection fraction was in the    range of 40% to 45%.   Previous hearing problem,  led to MRI of the brain Discovery of mass right side of the brain Evaluated at [facility] found to have meningioma Had resection  04/21/2017 surgery resection     May 14 2011  he had acute onset of slurred speech. He was playing cards shortly after and he was unable to do any math. Symptoms lasted for approximately one to 2 hours. Blood pressure prior to this event had been well controlled though during and after the event, he had severe hypertension with systolic pressures sometimes greater than 200. He was in the emergency room for 2 days with extensive testing.   PMH:   has a past medical history of Allergic rhinitis due to pollen, CAD (coronary artery disease) (2002), Chicken pox, Diabetes ([place]), Diabetes mellitus, Dysplastic nevus (01/25/2008), Elevated prostate specific antigen (PSA) (02/21/2001), Headache(784.0), Hemiplegic migraine, without mention of intractable migraine without mention of status migrainosus, HLD (hyperlipidemia), HTN (hypertension) (05/08/2007), Hyperhidrosis, Impotence of organic origin (07/24/2007), Leg pain, Measles, Mumps, Nausea with vomiting, Neuropathy, Occlusion and stenosis of carotid artery without  mention of cerebral infarction (09/15/2006), Peripheral neuropathy (01/31/2008), Peripheral vascular disease ([place]), Personal history of urinary calculi, Pleural effusion (2007), Restless legs syndrome (RLS) (01/10/2007), Slurred speech, and TIA (transient ischemic attack).  PSH:    Past  Surgical History:  Procedure Laterality Date   CAROTID ENDARTERECTOMY  2001   right   CATHETER REMOVAL Right 06/03/2019   CHOLECYSTECTOMY  2003   collapse lung  2007   CORONARY ARTERY BYPASS GRAFT  2001   KNEE SURGERY Left 1981   LUNG SURGERY  2007   Left; decortication left lung for persistant pleural effusion and PTX. admission ten days at [facility].   PAROTID GLAND TUMOR EXCISION  1973   VASECTOMY      Current Outpatient Medications  Medication Sig Dispense Refill   acetaminophen (TYLENOL) 500 MG tablet Take 500 mg by mouth every 6 (six) hours as needed. Patient states he can take 2 tablets daily as needed for pain     atorvastatin (LIPITOR) 40 MG tablet Take 1 tablet (40 mg total) by mouth daily. 90 tablet 3   cetirizine (ZYRTEC) 10 MG tablet Take 10 mg by mouth as needed.      finasteride (PROSCAR) 5 MG tablet Take 1 tablet (5 mg total) by mouth daily. 90 tablet 3   furosemide (LASIX) 20 MG tablet Take 2 tablets (40 mg total) by mouth 2 (two) times daily. 90 tablet 3   gabapentin (NEURONTIN) 300 MG capsule Take 300 mg by mouth 3 (three) times daily.     glucose blood test strip      hydrALAZINE (APRESOLINE) 50 MG tablet Take 1 tablet (50 mg total) by mouth 3 (three) times daily. 270 tablet 3   ketoconazole (NIZORAL) 2 % shampoo Apply to scalp as directed. Leave in for 10 minutes before rinsing. 120 mL 2   magnesium gluconate (MAGONATE) 500 MG tablet Take 500 mg by mouth daily.     metFORMIN (GLUCOPHAGE) 500 MG tablet Take 500 mg by mouth 2 (two) times daily with a meal.     metoprolol succinate (TOPROL-XL) 50 MG 24 hr tablet Take 1 tablet (50 mg total) by mouth daily. TAKE WITH OR IMMEDIATELY FOLLOWING A MEAL. 90 tablet 3   Multiple Vitamin (MULTI-VITAMIN) tablet Take by mouth.     nitroGLYCERIN (NITROSTAT) 0.4 MG SL tablet Place 1 tablet (0.4 mg total) under the tongue every 5 (five) minutes as needed for chest pain. 25 tablet 3   pramipexole (MIRAPEX) 0.25 MG tablet Take 1 tablet  (0.25 mg total) by mouth 3 (three) times daily. 270 tablet 3   promethazine (PHENERGAN) 12.5 MG tablet Take 1 tablet (12.5 mg total) by mouth every 8 (eight) hours as needed for nausea. 20 tablet 0   quinapril (ACCUPRIL) 20 MG tablet TAKE 1 AND 1/2 TABLETS BY MOUTH DAILY 135 tablet 2   rivaroxaban (XARELTO) 20 MG TABS tablet Take 1 tablet (20 mg total) by mouth daily. 90 tablet 1   traMADol (ULTRAM) 50 MG tablet Take 1 tablet (50 mg total) by mouth every 6 (six) hours as needed. 15 tablet 0   triamcinolone lotion (KENALOG) 0.1 % Apply 1 application topically 2 (two) times daily.     No current facility-administered medications for this visit.     Allergies:   Iodinated diagnostic agents and Other   Social History:  The patient  reports that he has quit smoking. His smoking use included cigarettes. He has a 20.00 pack-year smoking history. He has never used smokeless tobacco. He  reports that he does not drink alcohol and does not use drugs.   Family History:   family history includes Angina in his father; Anxiety disorder in his brother; Cancer in his father and another family member; Colon polyps in his sister; Coronary artery disease in an other family member; Emphysema in his father; Heart attack in his brother and mother; Heart disease in his maternal aunt; Heart failure in his father; Lymphoma in his sister.    Review of Systems: Review of Systems  Constitutional: Negative.   HENT: Negative.    Respiratory:  Positive for shortness of breath.   Cardiovascular:  Positive for leg swelling.  Gastrointestinal: Negative.   Musculoskeletal: Negative.   Neurological: Negative.   Psychiatric/Behavioral: Negative.    All other systems reviewed and are negative.  PHYSICAL EXAM: VS:  BP 120/60 (BP Location: Left Arm, Patient Position: Sitting, Cuff Size: Normal)   Pulse 68   Ht 5' 8.5" (1.74 m)   Wt 204 lb 6 oz (92.7 kg)   SpO2 98%   BMI 30.62 kg/m  , BMI Body mass index is 30.62  kg/m. Constitutional:  oriented to person, place, and time. No distress.  HENT:  Head: Grossly normal Eyes:  no discharge. No scleral icterus.  Neck: No JVD, no carotid bruits  Cardiovascular: Regular rate and rhythm, no murmurs appreciated 1+ pitting lower extremity edema to below the knees, feet are 2+ swollen Pulmonary/Chest: Clear to auscultation bilaterally, no wheezes or rails Abdominal: Soft.  no distension.  no tenderness.  Musculoskeletal: Normal range of motion Neurological:  normal muscle tone. Coordination normal. No atrophy Skin: Skin warm and dry Psychiatric: normal affect, pleasant   Recent Labs: No results found for requested labs within last 8760 hours.    Lipid Panel Lab Results  Component Value Date   CHOL 147 06/06/2013   HDL 37.90 (L) 06/06/2013   LDLCALC 61 06/06/2013   TRIG 243.0 (H) 06/06/2013      Wt Readings from Last 3 Encounters:  01/04/21 204 lb 6 oz (92.7 kg)  10/21/20 200 lb 4 oz (90.8 kg)  07/21/20 205 lb (93 kg)     ASSESSMENT AND PLAN:  Essential hypertension -  Blood pressure is well controlled on today's visit. No changes made to the medications.  Chronic systolic and diastolic CHF Worsening leg swelling, foot swelling Eating [employer] food restaurants on a regular basis Reports compliance with Lasix 40 twice daily but does have incontinence issues, uses a pad -Reports he is desperate for relief given the discomfort in the legs Recommend he add metolazone 5 mg no more than twice a week to take 30 minutes prior to morning Lasix We will add potassium 20 daily, extra potassium when he takes metolazone Recommends rechecking with [place] in the next week or so to make sure he is improving Strongly recommended he cut back on [place] food Previously declined echocardiogram, we did discuss repeat, he will call us  Shortness of breath Likely multifactorial including obesity, deconditioning, fluid retention/pulmonary edema Recommended weight  loss, walking program Worsening leg swelling, plan as above  Atherosclerosis of native coronary artery with stable angina Ejection fraction 40-45%, 2019 Previous stress test 2018 with ejection fraction 47% Cholesterol at goal  Carotid disease Stable, s/p CEA  Numbness left arm Prior history of TIAs He does have chronic small vessel disease Managed with Xarelto  Hyperlipidemia At goal  Diabetes type 2 with complications Diet discretion recommended, eating out at [employer]   Total encounter time  more than 25 minutes  Greater than 50% was spent in counseling and coordination of care with the patient    No orders of the defined types were placed in this encounter.     Signed, Esmond Plants, M.D., Ph.D. 01/04/2021  [place], [place]

## 2021-01-04 ENCOUNTER — Encounter: Payer: Self-pay | Admitting: Cardiovascular Disease

## 2021-01-04 ENCOUNTER — Other Ambulatory Visit: Payer: Self-pay

## 2021-01-04 ENCOUNTER — Ambulatory Visit: Payer: PPO | Admitting: Cardiovascular Disease

## 2021-01-04 VITALS — BP 120/60 | HR 68 | Ht 68.5 in | Wt 204.4 lb

## 2021-01-04 DIAGNOSIS — E782 Mixed hyperlipidemia: Secondary | ICD-10-CM | POA: Diagnosis not present

## 2021-01-04 DIAGNOSIS — E1143 Type 2 diabetes mellitus with diabetic autonomic (poly)neuropathy: Secondary | ICD-10-CM | POA: Diagnosis not present

## 2021-01-04 DIAGNOSIS — I10 Essential (primary) hypertension: Secondary | ICD-10-CM

## 2021-01-04 DIAGNOSIS — I739 Peripheral vascular disease, unspecified: Secondary | ICD-10-CM

## 2021-01-04 DIAGNOSIS — G459 Transient cerebral ischemic attack, unspecified: Secondary | ICD-10-CM | POA: Diagnosis not present

## 2021-01-04 DIAGNOSIS — I6523 Occlusion and stenosis of bilateral carotid arteries: Secondary | ICD-10-CM

## 2021-01-04 DIAGNOSIS — I5042 Chronic combined systolic (congestive) and diastolic (congestive) heart failure: Secondary | ICD-10-CM | POA: Diagnosis not present

## 2021-01-04 DIAGNOSIS — I25118 Atherosclerotic heart disease of native coronary artery with other forms of angina pectoris: Secondary | ICD-10-CM | POA: Diagnosis not present

## 2021-01-04 MED ORDER — METOLAZONE 5 MG PO TABS: 5.0000 mg | ORAL_TABLET | Freq: Every day | ORAL | 1 refills | Status: DC | PRN

## 2021-01-04 MED ORDER — POTASSIUM CHLORIDE CRYS ER 20 MEQ PO TBCR: 20.0000 meq | EXTENDED_RELEASE_TABLET | Freq: Every day | ORAL | 3 refills | Status: DC

## 2021-01-04 NOTE — Patient Instructions (Addendum)
Medication Instructions:  Please START metolazone 5 mg  Take sparingly in the morning 30 min before morning lasix 40 mg Only take for severe leg swelling Try not to take more than 2 metolazone a week Take extra potassium 20 meq  Stay on lasix 40 twice a day  Potassium 20 meq daily  If you need a refill on your cardiac medications before your next appointment, please call your pharmacy.   Lab work: No new labs needed  Testing/Procedures: No new testing needed  Follow-Up: At [facility], you and your health needs are our priority.  As part of our continuing mission to provide you with exceptional heart care, we have created designated Provider Care Teams.  These Care Teams include your primary Cardiologist (physician) and Advanced Practice Providers (APPs -  Physician Assistants and Nurse Practitioners) who all work together to provide you with the care you need, when you need it.  You will need a follow up appointment in 2-3 month  Providers on your designated Care Team:   Murray Hodgkins, NP Christell Faith, PA-C Cadence Kathlen Mody, [place]  COVID-19 Vaccine Information can be found at: [email or web address].co.uk For questions related to vaccine distribution or appointments, please email [email or web address] or call [phone].

## 2021-01-07 DIAGNOSIS — E119 Type 2 diabetes mellitus without complications: Secondary | ICD-10-CM | POA: Diagnosis not present

## 2021-01-28 ENCOUNTER — Telehealth: Payer: Self-pay | Admitting: Cardiovascular Disease

## 2021-01-28 DIAGNOSIS — I5033 Acute on chronic diastolic (congestive) heart failure: Secondary | ICD-10-CM

## 2021-01-28 NOTE — Telephone Encounter (Signed)
Pt c/o BP issue: STAT if pt c/o blurred vision, one-sided weakness or slurred speech  1. What are your last 5 BP readings?  This morning  103/48 Yesterday 112/60 12/6 118/65    2. Are you having any other symptoms (ex. Dizziness, headache, blurred vision, passed out)? Patient "just about blacked out", very lightheadedness, tired, legs are weak.  3. What is your BP issue? Too low

## 2021-01-28 NOTE — Telephone Encounter (Signed)
Returned call to patient. 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 Then after church patient went to lunch and had an episode where the food came back up. He has had  3 episodes of pre-syncope in the last week Patients stated he has fatigue, SOB, and his legs are real weak. He cannot take the trash out without getting winded.  Patient was seen on 01/04/21 by Dr. Rockey Situ and the following med changes were made.  Medication Instructions:  Please START metolazone 5 mg  Take sparingly in the morning 30 min before morning lasix 40 mg Only take for severe leg swelling Try not to take more than 2 metolazone a week Take extra potassium 20 meq   Stay on lasix 40 twice a day   Potassium 20 meq daily   Patient has not had any lab work since 10/08/20.  Put orders in for a stat lab draw tomorrow (CBC, BMP) prior to the DOD appointment he has been given with Dr. Garen Lah at 3:20.

## 2021-01-29 ENCOUNTER — Other Ambulatory Visit
Admission: RE | Admit: 2021-01-29 | Discharge: 2021-01-29 | Disposition: A | Discharge status: Home / Self Care | Payer: PPO | Attending: Cardiology | Admitting: Cardiology

## 2021-01-29 ENCOUNTER — Ambulatory Visit (INDEPENDENT_AMBULATORY_CARE_PROVIDER_SITE_OTHER): Payer: PPO | Admitting: Cardiology

## 2021-01-29 ENCOUNTER — Encounter: Payer: Self-pay | Admitting: Cardiology

## 2021-01-29 ENCOUNTER — Other Ambulatory Visit: Payer: Self-pay

## 2021-01-29 VITALS — BP 120/58 | HR 63 | Ht 68.5 in | Wt 203.8 lb

## 2021-01-29 DIAGNOSIS — I429 Cardiomyopathy, unspecified: Secondary | ICD-10-CM

## 2021-01-29 DIAGNOSIS — I2510 Atherosclerotic heart disease of native coronary artery without angina pectoris: Secondary | ICD-10-CM

## 2021-01-29 DIAGNOSIS — I5033 Acute on chronic diastolic (congestive) heart failure: Secondary | ICD-10-CM | POA: Diagnosis not present

## 2021-01-29 DIAGNOSIS — R42 Dizziness and giddiness: Secondary | ICD-10-CM

## 2021-01-29 DIAGNOSIS — N179 Acute kidney failure, unspecified: Secondary | ICD-10-CM

## 2021-01-29 DIAGNOSIS — R55 Syncope and collapse: Secondary | ICD-10-CM

## 2021-01-29 LAB — CBC
HCT: 36.9 % — ABNORMAL LOW (ref 39.0–52.0)
Hemoglobin: 12.2 g/dL — ABNORMAL LOW (ref 13.0–17.0)
MCH: 29.5 pg (ref 26.0–34.0)
MCHC: 33.1 g/dL (ref 30.0–36.0)
MCV: 89.1 fL (ref 80.0–100.0)
Platelets: 227 10*3/uL (ref 150–400)
RBC: 4.14 MIL/uL — ABNORMAL LOW (ref 4.22–5.81)
RDW: 13.6 % (ref 11.5–15.5)
WBC: 8.7 10*3/uL (ref 4.0–10.5)
nRBC: 0 % (ref 0.0–0.2)

## 2021-01-29 LAB — BASIC METABOLIC PANEL
Anion gap: 11 (ref 5–15)
BUN: 63 mg/dL — ABNORMAL HIGH (ref 8–23)
CO2: 28 mmol/L (ref 22–32)
Calcium: 9.4 mg/dL (ref 8.9–10.3)
Chloride: 97 mmol/L — ABNORMAL LOW (ref 98–111)
Creatinine, Ser: 2.19 mg/dL — ABNORMAL HIGH (ref 0.61–1.24)
GFR, Estimated: 29 mL/min — ABNORMAL LOW (ref >60)
Glucose, Bld: 184 mg/dL — ABNORMAL HIGH (ref 70–99)
Potassium: 4.3 mmol/L (ref 3.5–5.1)
Sodium: 136 mmol/L (ref 135–145)

## 2021-01-29 NOTE — Patient Instructions (Signed)
Medication Instructions:   Your physician has recommended you make the following change in your medication:    STOP taking your Quinapril.  2.   STOP taking your Metolazone.      *If you need a refill on your cardiac medications before your next appointment, please call your pharmacy*   Lab Work:  Your physician recommends that you return for lab work (BMP) in: 1 week   Please return to our office on_____________________at______________am/pm    Testing/Procedures:  Your physician has requested that you have an echocardiogram. Echocardiography is a painless test that uses sound waves to create images of your heart. It provides your doctor with information about the size and shape of your heart and how well your heart's chambers and valves are working. This procedure takes approximately one hour. There are no restrictions for this procedure.   Follow-Up: At [facility], you and your health needs are our priority.  As part of our continuing mission to provide you with exceptional heart care, we have created designated Provider Care Teams.  These Care Teams include your primary Cardiologist (physician) and Advanced Practice Providers (APPs -  Physician Assistants and Nurse Practitioners) who all work together to provide you with the care you need, when you need it.  We recommend signing up for the patient portal called "MyChart".  Sign up information is provided on this After Visit Summary.  MyChart is used to connect with patients for Virtual Visits (Telemedicine).  Patients are able to view lab/test results, encounter notes, upcoming appointments, etc.  Non-urgent messages can be sent to your provider as well.   To learn more about what you can do with MyChart, go to [email or web address].    Your next appointment:   2-3 week(s)  The format for your next appointment:   In Person  Provider:   You may see Ida Rogue, MD or one of the following Advanced Practice Providers  on your designated Care Team:   Murray Hodgkins, NP Christell Faith, PA-C Cadence Kathlen Mody, [place]    Other Instructions

## 2021-01-29 NOTE — Progress Notes (Signed)
Cardiology Office Note:    Date:  01/29/2021   ID:  Kevin Choi, DOB [birth year], MRN [id number]  PCP:  [name], MD   [facility] Providers Cardiologist:  None     Referring MD: [name], MD   No chief complaint on file.   History of Present Illness:    Kevin Choi is a 80 y.o. male with a hx of CAD/CABG, HFrEF EF 40 to 45%, carotid artery stenosis s/p CEA, TIA, meningioma s/p resection who presents due to dizziness.  Patient is a Environmental education officer, was presyncopal during a sermon.  States feeling fatigued with episodes of shortness of breath and weakness in his legs.  Minimal exertion such as taking out his trash makes him short of breath and dizzy.  He checks his blood pressure frequently at home, systolics today was 90.  Seen in the office about a month ago metolazone 1-2 times weekly was added to his daily diuretic regimen.  He also takes Lasix 40 mg daily. Low-salt diet was advised, echocardiogram recommended previously and this was declined.  Edema is slightly improved from prior.  CBC and BMP obtained earlier today.  Creatinine was 2.19.  Prior studies Echocardiogram 2019 EF 40 to 45% Lexiscan Myoview 05/2016 no evidence for ischemia  Past Medical History:  Diagnosis Date   Allergic rhinitis due to pollen    CAD (coronary artery disease) 2002   bypass   Chicken pox    Diabetes ([place])    Diabetes mellitus    Dysplastic nevus 01/25/2008   Right mid back. Moderate atypia, close to margin.   Elevated prostate specific antigen (PSA) 02/21/2001   Headache(784.0)    Hemiplegic migraine, without mention of intractable migraine without mention of status migrainosus    HLD (hyperlipidemia)    HTN (hypertension) 05/08/2007   Hyperhidrosis    Impotence of organic origin 07/24/2007   Leg pain    Measles    Mumps    Nausea with vomiting    Neuropathy    Occlusion and stenosis of carotid artery without mention of cerebral infarction 09/15/2006   Peripheral  neuropathy 01/31/2008   Peripheral vascular disease ([place])    Personal history of urinary calculi    Pleural effusion 2007   with pneumothorax    Restless legs syndrome (RLS) 01/10/2007   Slurred speech    TIA (transient ischemic attack)    Patient stated 2 months ago    Past Surgical History:  Procedure Laterality Date   CAROTID ENDARTERECTOMY  2001   right   CATHETER REMOVAL Right 06/03/2019   CHOLECYSTECTOMY  2003   collapse lung  2007   CORONARY ARTERY BYPASS GRAFT  2001   KNEE SURGERY Left 1981   LUNG SURGERY  2007   Left; decortication left lung for persistant pleural effusion and PTX. admission [facility] at [facility].   PAROTID GLAND TUMOR EXCISION  1973   VASECTOMY      Current Medications: Current Meds  Medication Sig   acetaminophen (TYLENOL) 500 MG tablet Take 500 mg by mouth every 6 (six) hours as needed. Patient states he can take 2 tablets daily as needed for pain   atorvastatin (LIPITOR) 40 MG tablet Take 1 tablet (40 mg total) by mouth daily.   cetirizine (ZYRTEC) 10 MG tablet Take 10 mg by mouth as needed.    finasteride (PROSCAR) 5 MG tablet Take 1 tablet (5 mg total) by mouth daily.   furosemide (LASIX) 20 MG tablet Take 2 tablets (40 mg  total) by mouth 2 (two) times daily.   gabapentin (NEURONTIN) 300 MG capsule Take 300 mg by mouth 3 (three) times daily.   glucose blood test strip    hydrALAZINE (APRESOLINE) 50 MG tablet Take 1 tablet (50 mg total) by mouth 3 (three) times daily.   ketoconazole (NIZORAL) 2 % shampoo Apply to scalp as directed. Leave in for 10 minutes before rinsing.   magnesium gluconate (MAGONATE) 500 MG tablet Take 500 mg by mouth daily.   metFORMIN (GLUCOPHAGE) 500 MG tablet Take 500 mg by mouth 2 (two) times daily with a meal.   metoprolol succinate (TOPROL-XL) 50 MG 24 hr tablet Take 1 tablet (50 mg total) by mouth daily. TAKE WITH OR IMMEDIATELY FOLLOWING A MEAL.   Multiple Vitamin (MULTI-VITAMIN) tablet Take by mouth.   nitroGLYCERIN  (NITROSTAT) 0.4 MG SL tablet Place 1 tablet (0.4 mg total) under the tongue every 5 (five) minutes as needed for chest pain.   potassium chloride SA (KLOR-CON) 20 MEQ tablet Take 1 tablet (20 mEq total) by mouth daily.   pramipexole (MIRAPEX) 0.25 MG tablet Take 1 tablet (0.25 mg total) by mouth 3 (three) times daily.   rivaroxaban (XARELTO) 20 MG TABS tablet Take 1 tablet (20 mg total) by mouth daily.   triamcinolone lotion (KENALOG) 0.1 % Apply 1 application topically 2 (two) times daily.   [DISCONTINUED] metFORMIN (GLUCOPHAGE) 500 MG tablet Take 1 tablet by mouth 2 (two) times daily with a meal.   [DISCONTINUED] metolazone (ZAROXOLYN) 5 MG tablet Take 1 tablet (5 mg total) by mouth daily as needed.   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       Children:  1 son, 1 daughter has 6 grandchildren.      Employment: Retired; Therapist, sports as needed.  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        Exercising:  Hiking, playing golf, volunteering is very physical.      Per Dr. Rockey Situ- No Salt Diet.       ADLs:  Performs all ADL's, drives.        Caffeine use: Tea. Carbonated beverages; occasionally.       Smoke alarms and carbon monoxide detectors in the home.       Guns in the home not stored in locked cabinet.       Organ donor: Yes.      Patient does not have Living Will,does not have [place].   Social  Determinants of Health   Financial Resource Strain: Not on file  Food Insecurity: Not on file  Transportation Needs: Not on file  Physical Activity: Not on file  Stress: Not on file  Social Connections: Not on file     Family History: The patient's family history includes Angina in his father; Anxiety disorder in his brother; Cancer in his father and another family member; Colon polyps in his sister; Coronary artery disease in an other family member; Emphysema in his father; Heart attack in his brother and mother; Heart disease in his maternal aunt; Heart failure in his father; Lymphoma in his sister.  ROS:   Please see the history of present illness.     All other systems reviewed and are negative.  EKGs/Labs/Other Studies Reviewed:    The following studies were reviewed today:   EKG:  EKG is  ordered today.  The ekg ordered today demonstrates normal sinus rhythm, left bundle branch block.  Recent Labs: 01/29/2021: BUN 63; Creatinine, Ser 2.19; Hemoglobin 12.2; Platelets 227; Potassium 4.3; Sodium 136  Recent Lipid Panel    Component Value Date/Time   CHOL 147 06/06/2013 0850   CHOL 128 05/15/2011 0549   TRIG 243.0 (H) 06/06/2013 0850   TRIG 167 05/15/2011 0549   TRIG 161 04/24/2008 0000   HDL 37.90 (L) 06/06/2013 0850   HDL 29 (L) 05/15/2011 0549   CHOLHDL 4 06/06/2013 0850   VLDL 48.6 (H) 06/06/2013 0850   VLDL 33 05/15/2011 0549   LDLCALC 61 06/06/2013 0850   LDLCALC 66 05/15/2011 0549     Risk Assessment/Calculations:         Physical Exam:    VS:  BP (!) 120/58   Pulse 63   Ht 5' 8.5" (1.74 m)   Wt 203 lb 12.8 oz (92.4 kg)   SpO2 97%   BMI 30.54 kg/m     Wt Readings from Last 3 Encounters:   01/29/21 203 lb 12.8 oz (92.4 kg)  01/04/21 204 lb 6 oz (92.7 kg)  10/21/20 200 lb 4 oz (90.8 kg)     GEN:  Well nourished, well developed in no acute distress HEENT: Normal NECK: No JVD; No carotid bruits LYMPHATICS: No lymphadenopathy CARDIAC: RRR, no murmurs, rubs, gallops RESPIRATORY:  Clear to auscultation without rales, wheezing or rhonchi  ABDOMEN: Soft, non-tender, non-distended MUSCULOSKELETAL:  1+ edema; No deformity  SKIN: Warm and dry NEUROLOGIC:  Alert and oriented x 3 PSYCHIATRIC:  Normal affect   ASSESSMENT:    1. Postural dizziness with presyncope   2. AKI (acute kidney injury) ([place])   3. Cardiomyopathy, unspecified type ([place])   4. Coronary artery disease involving native heart, unspecified vessel or lesion type, unspecified whether angina present    PLAN:    In order of problems listed above:  Dizziness, hypotension with systolic in the 93T at home.  BP diastolic low today.  Stop quinapril to give patient more BP room.  Hopefully this helps with symptoms.  Stop metolazone. Creatinine today 2.19, prior normal but this was 3 years ago.  Hold quinapril, stop metolazone as above.  Repeat BMP in 1 week.  If renal function stays persistent, consider renal consult.  Continue to avoid nephrotoxic agents. Cardiomyopathy probably ischemic, last EF 40 to 45%.  Last echo 2019.  Obtain echocardiogram.  Continue Toprol-XL, hydralazine. History of CAD/CABG.  Continue Lipitor, on Xarelto for TIAs.   Follow-up in 3 weeks with APP or Dr. Rockey Situ.      Medication Adjustments/Labs and  Tests Ordered: Current medicines are reviewed at length with the patient today.  Concerns regarding medicines are outlined above.  Orders Placed This Encounter  Procedures   Basic metabolic panel   EKG 82-SUOR   ECHOCARDIOGRAM COMPLETE    No orders of the defined types were placed in this encounter.   Patient Instructions  Medication Instructions:   Your physician has recommended you  make the following change in your medication:    STOP taking your Quinapril.  2.   STOP taking your Metolazone.      *If you need a refill on your cardiac medications before your next appointment, please call your pharmacy*   Lab Work:  Your physician recommends that you return for lab work (BMP) in: 1 week   Please return to our office on_____________________at______________am/pm    Testing/Procedures:  Your physician has requested that you have an echocardiogram. Echocardiography is a painless test that uses sound waves to create images of your heart. It provides your doctor with information about the size and shape of your heart and how well your heart's chambers and valves are working. This procedure takes approximately one hour. There are no restrictions for this procedure.   Follow-Up: At [facility], you and your health needs are our priority.  As part of our continuing mission to provide you with exceptional heart care, we have created designated Provider Care Teams.  These Care Teams include your primary Cardiologist (physician) and Advanced Practice Providers (APPs -  Physician Assistants and Nurse Practitioners) who all work together to provide you with the care you need, when you need it.  We recommend signing up for the patient portal called "MyChart".  Sign up information is provided on this After Visit Summary.  MyChart is used to connect with patients for Virtual Visits (Telemedicine).  Patients are able to view lab/test results, encounter notes, upcoming appointments, etc.  Non-urgent messages can be sent to your provider as well.   To learn more about what you can do with MyChart, go to [email or web address].    Your next appointment:   2-3 week(s)  The format for your next appointment:   In Person  Provider:   You may see Ida Rogue, MD or one of the following Advanced Practice Providers on your designated Care Team:   Murray Hodgkins, NP Christell Faith, PA-C Cadence Kathlen Mody, [place]    Other Instructions    Signed, Kate Sable, MD  01/29/2021 4:52 PM    [place]

## 2021-02-01 ENCOUNTER — Encounter: Payer: Self-pay | Admitting: Urology

## 2021-02-02 ENCOUNTER — Ambulatory Visit (INDEPENDENT_AMBULATORY_CARE_PROVIDER_SITE_OTHER): Payer: PPO

## 2021-02-02 ENCOUNTER — Other Ambulatory Visit: Payer: Self-pay

## 2021-02-02 DIAGNOSIS — I429 Cardiomyopathy, unspecified: Secondary | ICD-10-CM

## 2021-02-02 DIAGNOSIS — R42 Dizziness and giddiness: Secondary | ICD-10-CM

## 2021-02-02 LAB — ECHOCARDIOGRAM COMPLETE
AR max vel: 2.26 cm2
AV Area VTI: 2.3 cm2
AV Area mean vel: 2.33 cm2
AV Mean grad: 4 mmHg
AV Peak grad: 7.1 mmHg
Ao pk vel: 1.33 m/s
Area-P 1/2: 4.06 cm2
S' Lateral: 4.1 cm

## 2021-02-02 MED ORDER — PERFLUTREN LIPID MICROSPHERE
1.0000 mL | INTRAVENOUS | Status: AC | PRN
  Administered 2021-02-02: 2 mL via INTRAVENOUS

## 2021-02-05 ENCOUNTER — Other Ambulatory Visit: Payer: Self-pay

## 2021-02-05 ENCOUNTER — Other Ambulatory Visit (INDEPENDENT_AMBULATORY_CARE_PROVIDER_SITE_OTHER): Payer: PPO

## 2021-02-05 DIAGNOSIS — N179 Acute kidney failure, unspecified: Secondary | ICD-10-CM

## 2021-02-06 LAB — BASIC METABOLIC PANEL
BUN/Creatinine Ratio: 26 — ABNORMAL HIGH (ref 10–24)
BUN: 40 mg/dL — ABNORMAL HIGH (ref 8–27)
CO2: 24 mmol/L (ref 20–29)
Calcium: 10.2 mg/dL (ref 8.6–10.2)
Chloride: 99 mmol/L (ref 96–106)
Creatinine, Ser: 1.56 mg/dL — ABNORMAL HIGH (ref 0.76–1.27)
Glucose: 155 mg/dL — ABNORMAL HIGH (ref 70–99)
Potassium: 4.1 mmol/L (ref 3.5–5.2)
Sodium: 142 mmol/L (ref 134–144)
eGFR: 44 mL/min/{1.73_m2} — ABNORMAL LOW (ref >59)

## 2021-02-12 ENCOUNTER — Telehealth: Payer: Self-pay | Admitting: Cardiology

## 2021-02-12 NOTE — Telephone Encounter (Signed)
Attempted to call the patient. No answer- I left a detailed message of results on his voice mail (ok per DPR).   I asked that he call back with any further questions/ concerns.  

## 2021-02-12 NOTE — Telephone Encounter (Signed)
Patient returning call.

## 2021-02-12 NOTE — Telephone Encounter (Signed)
The patient has been notified of the result and verbalized understanding.  All questions (if any) were answered. Kavin Leech, RN 02/12/2021 2:12 PM

## 2021-02-12 NOTE — Telephone Encounter (Signed)
Kevin Sable, MD  02/12/2021 10:29 AM EST     Patient of Dr. Rockey Situ. Creatinine is improving. Continue recommendations as per clinic visit. Keep follow up appointment.

## 2021-02-12 NOTE — Telephone Encounter (Signed)
Attempted to return a call to the patient. No answer- I did not leave a voice mail.  Will try the patient back prior to the end of the day.

## 2021-03-01 NOTE — Progress Notes (Signed)
Cardiology Office Note  Date:  03/02/2021   ID:  Kevin Choi, DOB February 18, 1941, MRN [id number]  PCP:  [name], MD   Chief Complaint  Patient presents with   2-3 week follow up     Patient c/o fatigue and shortness of breath with over exertion. Medications reviewed by the patient verbally.      HPI:  81 year old gentleman with a history of  coronary artery disease,  bypass surgery with a LIMA to the LAD in 2002,  vein graft to the diagonal,  diabetes,  hypertension,  hyperlipidemia,  pleural effusion requiring a VATS,  carotid endarterectomy on the right in 2001,  TIA symptoms 3 times, once in 2002, October 2012 in March 2013. Status post bilateral total knee replacements 40% left carotid arterial disease resection of right frontal meningioma. On xarelto for TIAs EF 40 to 45% in 2019, down to 35 to 40% in 2022 who presents for routine followup of his coronary artery disease ,  leg swelling and  shortness of breath.  Echo, results reviewed EF 35 to 40% In 2019 was 40 to 34%  Denies anginal symptoms  Feels shaky today "I have myoclonus", jerking, tremor/tremble Taking mirapex  Seen in clinic 01/29/2021: BP low Quinapril and metolazone held CR 2.19  Feb 05, 2021 Follow up CR 1.56, BUN 40  " BP at home 105 "  at home  Still on lasix 40 BID  EKG personally reviewed by myself on todays visit Sinus bradycardia rate 53 bpm left bundle branch block, sinus arrhythmia  Other past medical history reviewed Numb left arm, 03/2019 Left hands fingers not moving well going through folders Numbness got worse Left lip numb Lasted 45 min, then resolved without intervention No further episodes, etiology unclear  Previous MRI/MRA from 2019 reviewed Meningioma noted, chronic small vessel disease noted  05/2019 Carotid u/s, results reviewed left ICA are consistent with a 40-59% stenosis. Right is open, s/p CEA  Echo 2019 - Left ventricle: The cavity size was  normal. There was moderate    concentric hypertrophy. Systolic function was mildly to    moderately reduced. The estimated ejection fraction was in the    range of 40% to 45%.   Previous hearing problem,  led to MRI of the brain Discovery of mass right side of the brain Evaluated at [facility] found to have meningioma Had resection  04/21/2017 surgery resection     May 14 2011  he had acute onset of slurred speech. He was playing cards shortly after and he was unable to do any math. Symptoms lasted for approximately one to 2 hours. Blood pressure prior to this event had been well controlled though during and after the event, he had severe hypertension with systolic pressures sometimes greater than 200. He was in the emergency room for 2 days with extensive testing.   PMH:   has a past medical history of Allergic rhinitis due to pollen, CAD (coronary artery disease) (2002), Chicken pox, Diabetes ([place]), Diabetes mellitus, Dysplastic nevus (01/25/2008), Elevated prostate specific antigen (PSA) (02/21/2001), Headache(784.0), Hemiplegic migraine, without mention of intractable migraine without mention of status migrainosus, HLD (hyperlipidemia), HTN (hypertension) (05/08/2007), Hyperhidrosis, Impotence of organic origin (07/24/2007), Leg pain, Measles, Mumps, Nausea with vomiting, Neuropathy, Occlusion and stenosis of carotid artery without mention of cerebral infarction (09/15/2006), Peripheral neuropathy (01/31/2008), Peripheral vascular disease ([place]), Personal history of urinary calculi, Pleural effusion (2007), Restless legs syndrome (RLS) (01/10/2007), Slurred speech, and TIA (transient ischemic attack).  PSH:  Past Surgical History:  Procedure Laterality Date   CAROTID ENDARTERECTOMY  2001   right   CATHETER REMOVAL Right 06/03/2019   CHOLECYSTECTOMY  2003   collapse lung  2007   CORONARY ARTERY BYPASS GRAFT  2001   KNEE SURGERY Left 1981   LUNG SURGERY  2007   Left; decortication  left lung for persistant pleural effusion and PTX. admission [facility] at [facility].   PAROTID GLAND TUMOR EXCISION  1973   VASECTOMY      Current Outpatient Medications  Medication Sig Dispense Refill   acetaminophen (TYLENOL) 500 MG tablet Take 500 mg by mouth every 6 (six) hours as needed. Patient states he can take 2 tablets daily as needed for pain     atorvastatin (LIPITOR) 40 MG tablet Take 1 tablet (40 mg total) by mouth daily. 90 tablet 3   cetirizine (ZYRTEC) 10 MG tablet Take 10 mg by mouth as needed.      finasteride (PROSCAR) 5 MG tablet Take 1 tablet (5 mg total) by mouth daily. 90 tablet 3   furosemide (LASIX) 20 MG tablet Take 2 tablets (40 mg total) by mouth 2 (two) times daily. 90 tablet 3   gabapentin (NEURONTIN) 300 MG capsule Take 300 mg by mouth 3 (three) times daily.     glucose blood test strip      hydrALAZINE (APRESOLINE) 50 MG tablet Take 1 tablet (50 mg total) by mouth 3 (three) times daily. 270 tablet 3   ketoconazole (NIZORAL) 2 % shampoo Apply to scalp as directed. Leave in for 10 minutes before rinsing. 120 mL 2   magnesium gluconate (MAGONATE) 500 MG tablet Take 500 mg by mouth daily.     metFORMIN (GLUCOPHAGE) 500 MG tablet Take 500 mg by mouth 2 (two) times daily with a meal.     metoprolol succinate (TOPROL-XL) 50 MG 24 hr tablet Take 1 tablet (50 mg total) by mouth daily. TAKE WITH OR IMMEDIATELY FOLLOWING A MEAL. 90 tablet 3   Multiple Vitamin (MULTI-VITAMIN) tablet Take by mouth.     nitroGLYCERIN (NITROSTAT) 0.4 MG SL tablet Place 1 tablet (0.4 mg total) under the tongue every 5 (five) minutes as needed for chest pain. 25 tablet 3   potassium chloride SA (KLOR-CON) 20 MEQ tablet Take 1 tablet (20 mEq total) by mouth daily. 90 tablet 3   pramipexole (MIRAPEX) 0.25 MG tablet Take 1 tablet (0.25 mg total) by mouth 3 (three) times daily. 270 tablet 3   promethazine (PHENERGAN) 12.5 MG tablet Take 1 tablet (12.5 mg total) by mouth every 8 (eight) hours as needed  for nausea. 20 tablet 0   rivaroxaban (XARELTO) 20 MG TABS tablet Take 1 tablet (20 mg total) by mouth daily. 90 tablet 1   traMADol (ULTRAM) 50 MG tablet Take 1 tablet (50 mg total) by mouth every 6 (six) hours as needed. 15 tablet 0   triamcinolone lotion (KENALOG) 0.1 % Apply 1 application topically 2 (two) times daily.     No current facility-administered medications for this visit.    Allergies:   Iodinated contrast media and Other   Social History:  The patient  reports that he has quit smoking. His smoking use included cigarettes. He has a 20.00 pack-year smoking history. He has never used smokeless tobacco. He reports that he does not drink alcohol and does not use drugs.   Family History:   family history includes Angina in his father; Anxiety disorder in his brother; Cancer in his father and  another family member; Colon polyps in his sister; Coronary artery disease in an other family member; Emphysema in his father; Heart attack in his brother and mother; Heart disease in his maternal aunt; Heart failure in his father; Lymphoma in his sister.    Review of Systems: Review of Systems  Constitutional: Negative.   HENT: Negative.    Respiratory:  Positive for shortness of breath.   Cardiovascular:  Positive for leg swelling.  Gastrointestinal: Negative.   Musculoskeletal: Negative.   Neurological: Negative.   Psychiatric/Behavioral: Negative.    All other systems reviewed and are negative.  PHYSICAL EXAM: VS:  BP (!) 148/70 (BP Location: Left Arm, Patient Position: Sitting, Cuff Size: Normal)    Pulse (!) 53    Ht 5' 8.5" (1.74 m)    Wt 207 lb 8 oz (94.1 kg)    SpO2 98%    BMI 31.09 kg/m  , BMI Body mass index is 31.09 kg/m. Constitutional:  oriented to person, place, and time. No distress.  HENT:  Head: Grossly normal Eyes:  no discharge. No scleral icterus.  Neck: No JVD, no carotid bruits  Cardiovascular: Regular rate and rhythm, no murmurs appreciated 1+ pitting lower  extremity edema to below the knees, feet are 2+ swollen Pulmonary/Chest: Clear to auscultation bilaterally, no wheezes or rails Abdominal: Soft.  no distension.  no tenderness.  Musculoskeletal: Normal range of motion Neurological:  normal muscle tone. Coordination normal. No atrophy Skin: Skin warm and dry Psychiatric: normal affect, pleasant  Recent Labs: 01/29/2021: Hemoglobin 12.2; Platelets 227 02/05/2021: BUN 40; Creatinine, Ser 1.56; Potassium 4.1; Sodium 142    Lipid Panel Lab Results  Component Value Date   CHOL 147 06/06/2013   HDL 37.90 (L) 06/06/2013   LDLCALC 61 06/06/2013   TRIG 243.0 (H) 06/06/2013      Wt Readings from Last 3 Encounters:  03/02/21 207 lb 8 oz (94.1 kg)  01/29/21 203 lb 12.8 oz (92.4 kg)  01/04/21 204 lb 6 oz (92.7 kg)     ASSESSMENT AND PLAN:  Essential hypertension -  Blood pressure is well controlled on today's visit. No changes made to the medications.  Chronic systolic and diastolic CHF Compliant with Lasix 40 twice daily Recommend avoid eating out, fluid moderation Given drop in ejection fraction 35 to 40%, Myoview ordered For bradycardia, decrease metoprolol succinate down to 25 daily  Shortness of breath Likely multifactorial including obesity, deconditioning, fluid retention/pulmonary edema Stress test above for ischemic work-up  Carotid disease Stable, s/p CEA  History of TIA, prior left arm numbness Prior history of TIAs He does have chronic small vessel disease Managed with Xarelto   Hyperlipidemia At goal  Diabetes type 2 with complications Diet discretion recommended, eating out at [employer] in the past   Total encounter time more than 25 minutes  Greater than 50% was spent in counseling and coordination of care with the patient    No orders of the defined types were placed in this encounter.      Signed, Esmond Plants, M.D., Ph.D. 03/02/2021  [place],  [address]

## 2021-03-02 ENCOUNTER — Other Ambulatory Visit: Payer: Self-pay

## 2021-03-02 ENCOUNTER — Encounter: Payer: Self-pay | Admitting: Cardiovascular Disease

## 2021-03-02 ENCOUNTER — Ambulatory Visit (INDEPENDENT_AMBULATORY_CARE_PROVIDER_SITE_OTHER): Payer: PPO | Admitting: Cardiovascular Disease

## 2021-03-02 VITALS — BP 148/70 | HR 53 | Ht 68.5 in | Wt 207.5 lb

## 2021-03-02 DIAGNOSIS — N179 Acute kidney failure, unspecified: Secondary | ICD-10-CM | POA: Diagnosis not present

## 2021-03-02 DIAGNOSIS — I2510 Atherosclerotic heart disease of native coronary artery without angina pectoris: Secondary | ICD-10-CM | POA: Diagnosis not present

## 2021-03-02 DIAGNOSIS — I10 Essential (primary) hypertension: Secondary | ICD-10-CM

## 2021-03-02 DIAGNOSIS — E782 Mixed hyperlipidemia: Secondary | ICD-10-CM

## 2021-03-02 DIAGNOSIS — I6523 Occlusion and stenosis of bilateral carotid arteries: Secondary | ICD-10-CM | POA: Diagnosis not present

## 2021-03-02 DIAGNOSIS — E1143 Type 2 diabetes mellitus with diabetic autonomic (poly)neuropathy: Secondary | ICD-10-CM | POA: Diagnosis not present

## 2021-03-02 DIAGNOSIS — I739 Peripheral vascular disease, unspecified: Secondary | ICD-10-CM | POA: Diagnosis not present

## 2021-03-02 DIAGNOSIS — I429 Cardiomyopathy, unspecified: Secondary | ICD-10-CM | POA: Diagnosis not present

## 2021-03-02 DIAGNOSIS — I428 Other cardiomyopathies: Secondary | ICD-10-CM

## 2021-03-02 MED ORDER — METOPROLOL SUCCINATE ER 50 MG PO TB24: 25.0000 mg | ORAL_TABLET | Freq: Every day | ORAL | 3 refills | Status: DC

## 2021-03-02 NOTE — Patient Instructions (Addendum)
Recent Echo  EF 35 to 40%  In 2019 was 40 to 34%   Medication Instructions:  Please decrease  metoprolol succinate down to 25 mg daily (from 50 mg)  If you need a refill on your cardiac medications before your next appointment, please call your pharmacy.   Lab work: No new labs needed  Testing/Procedures: myoview Veterinary surgeon)  Follow-Up: At [facility], you and your health needs are our priority.  As part of our continuing mission to provide you with exceptional heart care, we have created designated Provider Care Teams.  These Care Teams include your primary Cardiologist (physician) and Advanced Practice Providers (APPs -  Physician Assistants and Nurse Practitioners) who all work together to provide you with the care you need, when you need it.  You will need a follow up appointment in 3 months  Providers on your designated Care Team:   Murray Hodgkins, NP Christell Faith, PA-C Cadence Kathlen Mody, [place]  COVID-19 Vaccine Information can be found at: [email or web address].uk For questions related to vaccine distribution or appointments, please email [email or web address] or call [phone].   [facility] MYOVIEW Veterinary surgeon)  Your caregiver has ordered a Stress Test with nuclear imaging. The purpose of this test is to evaluate the blood supply to your heart muscle. This procedure is referred to as a "Non-Invasive Stress Test." This is because other than having an IV started in your vein, nothing is inserted or "invades" your body. Cardiac stress tests are done to find areas of poor blood flow to the heart by determining the extent of coronary artery disease (CAD). Some patients exercise on a treadmill, which naturally increases the blood flow to your heart, while others who are  unable to walk on a treadmill due to physical limitations have a pharmacologic/chemical stress agent called Lexiscan . This medicine will mimic walking on a treadmill by  temporarily increasing your coronary blood flow.   Please note: these test may take anywhere between 2-4 hours to complete  PLEASE REPORT TO [place] WILL DIRECT YOU WHERE TO GO  Instructions regarding medication:   _X___ : Hold diabetes medication morning of procedure Hold metformin 24 hrs prior to procedure and 48 hours after the procedure. May take metformin after the 48 hours.   _X___:  Hold other medications as follows: Lasix (take after procedure)  How to prepare for your Myoview test:  Do not eat or drink after midnight No caffeine for 24 hours prior to test No smoking 24 hours prior to test. ALL your medication may be taken with a few sips of water.   (Except for the meds mention above) Please wear a short sleeve shirt. Comfortable pants are appropriate. No perfume, cologne or lotion.  PLEASE NOTIFY THE OFFICE AT LEAST 41 HOURS IN ADVANCE IF YOU ARE UNABLE TO KEEP YOUR APPOINTMENT.  [phone] AND  PLEASE NOTIFY NUCLEAR MEDICINE AT [facility] AT LEAST 24 HOURS IN ADVANCE IF YOU ARE UNABLE TO KEEP YOUR APPOINTMENT. [phone]

## 2021-03-10 ENCOUNTER — Other Ambulatory Visit: Payer: Self-pay

## 2021-03-10 ENCOUNTER — Encounter
Admission: RE | Admit: 2021-03-10 | Discharge: 2021-03-10 | Disposition: A | Discharge status: Home / Self Care | Payer: PPO | Source: Ambulatory Visit | Attending: Cardiovascular Disease | Admitting: Cardiovascular Disease

## 2021-03-10 DIAGNOSIS — I428 Other cardiomyopathies: Secondary | ICD-10-CM | POA: Diagnosis not present

## 2021-03-10 DIAGNOSIS — I2510 Atherosclerotic heart disease of native coronary artery without angina pectoris: Secondary | ICD-10-CM | POA: Diagnosis not present

## 2021-03-10 DIAGNOSIS — I429 Cardiomyopathy, unspecified: Secondary | ICD-10-CM

## 2021-03-10 LAB — NM MYOCAR MULTI W/SPECT W/WALL MOTION / EF
LV dias vol: 112 mL (ref 62–150)
LV sys vol: 91 mL
MPHR: 138 {beats}/min
Nuc Stress EF: 19 %
Peak HR: 74 {beats}/min
Percent HR: 53 %
Rest HR: 56 {beats}/min
Rest Nuclear Isotope Dose: 10.5 mCi
SDS: 4
SRS: 12
SSS: 7
ST Depression (mm): 0 mm
Stress Nuclear Isotope Dose: 32.4 mCi
TID: 1.22

## 2021-03-10 MED ORDER — REGADENOSON 0.4 MG/5ML IV SOLN
0.4000 mg | Freq: Once | INTRAVENOUS | Status: AC
  Administered 2021-03-10: 0.4 mg via INTRAVENOUS

## 2021-03-10 MED ORDER — TECHNETIUM TC 99M TETROFOSMIN IV KIT
10.0000 | PACK | Freq: Once | INTRAVENOUS | Status: AC | PRN
  Administered 2021-03-10: 10.54 via INTRAVENOUS

## 2021-03-10 MED ORDER — TECHNETIUM TC 99M TETROFOSMIN IV KIT
32.4200 | PACK | Freq: Once | INTRAVENOUS | Status: AC | PRN
  Administered 2021-03-10: 32.42 via INTRAVENOUS

## 2021-03-12 ENCOUNTER — Telehealth: Payer: Self-pay | Admitting: Cardiovascular Disease

## 2021-03-12 NOTE — Telephone Encounter (Signed)
Patient calling to check status of stress test results

## 2021-03-12 NOTE — Telephone Encounter (Signed)
Final myoview report not signed off by Dr. Rockey Situ. I did have him briefly review this prior to calling the patient. Per Dr. Rockey Situ, he did not feel there was any new area of blockage, but old damage to the heart.   He will review this in more detail and we will call the patient back next week to confirm findings.  I have spoken with the patient and notified him of the above MD comments. He is aware he will receive a follow up call next week with Dr. Donivan Scull final recommendations.   The patient voices understanding and is agreeable.

## 2021-03-15 NOTE — Telephone Encounter (Signed)
I called and spoke with the patient regarding the final report on his nuclear stress test.  All questions were answered. The patient voices understanding of these results and was appreciative of the call back.

## 2021-03-15 NOTE — Telephone Encounter (Signed)
Kevin Merritts, MD  03/14/2021  9:32 PM EST     Stress test reviewed Small region likely old blockage from part of heart No new blockages indicated Ejection fraction estimate incorrect, would go by prior echocardiogram  No further testing needed, do not need catheterization based on results

## 2021-03-19 ENCOUNTER — Ambulatory Visit: Payer: PPO | Admitting: Cardiovascular Disease

## 2021-04-05 ENCOUNTER — Ambulatory Visit: Payer: Self-pay | Admitting: Urology

## 2021-04-05 ENCOUNTER — Encounter: Payer: Self-pay | Admitting: Urology

## 2021-04-05 ENCOUNTER — Other Ambulatory Visit: Payer: Self-pay

## 2021-04-05 ENCOUNTER — Ambulatory Visit: Payer: PPO | Admitting: Urology

## 2021-04-05 VITALS — BP 138/60 | HR 76 | Ht 68.0 in | Wt 195.0 lb

## 2021-04-05 DIAGNOSIS — R339 Retention of urine, unspecified: Secondary | ICD-10-CM | POA: Diagnosis not present

## 2021-04-05 DIAGNOSIS — N401 Enlarged prostate with lower urinary tract symptoms: Secondary | ICD-10-CM | POA: Diagnosis not present

## 2021-04-05 DIAGNOSIS — R350 Frequency of micturition: Secondary | ICD-10-CM | POA: Diagnosis not present

## 2021-04-05 LAB — BLADDER SCAN AMB NON-IMAGING: Scan Result: 106

## 2021-04-05 MED ORDER — FINASTERIDE 5 MG PO TABS: 5.0000 mg | ORAL_TABLET | Freq: Every day | ORAL | 3 refills | Status: DC

## 2021-04-05 NOTE — Progress Notes (Signed)
04/05/2021 1:19 PM   Kevin Choi 11/29/1940 [id number]  Referring provider: [name], MD [place] [facility] [place],  [place] [address]  Chief Complaint  Patient presents with   Benign Prostatic Hypertrophy    Urologic history:   1.  BPH with lower urinary tract symptoms             -On finasteride             -Mild to moderate residual   2.  History elevated PSA             -History of several biopsies between 2003-2010             -No records available; all benign per patient             -Baseline uncorrected PSA upper 1 range             -Prostate cancer screening discontinued  HPI: 81 y.o. male presents for annual follow-up  Doing well since last visit No bothersome LUTS Remains on finasteride Denies dysuria, gross hematuria Denies flank, abdominal or pelvic pain  PMH: Past Medical History:  Diagnosis Date   Allergic rhinitis due to pollen    CAD (coronary artery disease) 2002   bypass   Chicken pox    Diabetes ([place])    Diabetes mellitus    Dysplastic nevus 01/25/2008   Right mid back. Moderate atypia, close to margin.   Elevated prostate specific antigen (PSA) 02/21/2001   Headache(784.0)    Hemiplegic migraine, without mention of intractable migraine without mention of status migrainosus    HLD (hyperlipidemia)    HTN (hypertension) 05/08/2007   Hyperhidrosis    Impotence of organic origin 07/24/2007   Leg pain    Measles    Mumps    Nausea with vomiting    Neuropathy    Occlusion and stenosis of carotid artery without mention of cerebral infarction 09/15/2006   Peripheral neuropathy 01/31/2008   Peripheral vascular disease ([place])    Personal history of urinary calculi    Pleural effusion 2007   with pneumothorax    Restless legs syndrome (RLS) 01/10/2007   Slurred speech    TIA (transient ischemic attack)    Patient stated 2 months ago    Surgical History: Past Surgical History:  Procedure Laterality  Date   CAROTID ENDARTERECTOMY  2001   right   CATHETER REMOVAL Right 06/03/2019   CHOLECYSTECTOMY  2003   collapse lung  2007   CORONARY ARTERY BYPASS GRAFT  2001   KNEE SURGERY Left 1981   LUNG SURGERY  2007   Left; decortication left lung for persistant pleural effusion and PTX. admission ten days at [facility].   PAROTID GLAND TUMOR EXCISION  1973   VASECTOMY      Home Medications:  Allergies as of 04/05/2021       Reactions   Iodinated Contrast Media Nausea Only   Hypotension Hypotension   Other Nausea Only, Other (See Comments)   Contrast Dye-drops blood pressure. Contrast Dye- hypotension and nausa and syncope        Medication List        Accurate as of April 05, 2021  1:19 PM. If you have any questions, ask your nurse or doctor.          acetaminophen 500 MG tablet Commonly known as: TYLENOL Take 500 mg by mouth every 6 (six) hours as needed. Patient states he can take 2 tablets daily  as needed for pain   atorvastatin 40 MG tablet Commonly known as: LIPITOR Take 1 tablet (40 mg total) by mouth daily.   cetirizine 10 MG tablet Commonly known as: ZYRTEC Take 10 mg by mouth as needed.   finasteride 5 MG tablet Commonly known as: PROSCAR Take 1 tablet (5 mg total) by mouth daily.   furosemide 20 MG tablet Commonly known as: LASIX Take 2 tablets (40 mg total) by mouth 2 (two) times daily.   gabapentin 300 MG capsule Commonly known as: NEURONTIN Take 300 mg by mouth 3 (three) times daily.   glucose blood test strip   hydrALAZINE 50 MG tablet Commonly known as: APRESOLINE Take 1 tablet (50 mg total) by mouth 3 (three) times daily.   ketoconazole 2 % shampoo Commonly known as: NIZORAL Apply to scalp as directed. Leave in for 10 minutes before rinsing.   magnesium gluconate 500 MG tablet Commonly known as: MAGONATE Take 500 mg by mouth daily.   metFORMIN 500 MG tablet Commonly known as: GLUCOPHAGE Take 500 mg by mouth 2 (two) times daily with a  meal.   metoprolol succinate 50 MG 24 hr tablet Commonly known as: TOPROL-XL Take 0.5 tablets (25 mg total) by mouth daily.   Multi-Vitamin tablet Take by mouth.   nitroGLYCERIN 0.4 MG SL tablet Commonly known as: Nitrostat Place 1 tablet (0.4 mg total) under the tongue every 5 (five) minutes as needed for chest pain.   potassium chloride SA 20 MEQ tablet Commonly known as: KLOR-CON M Take 1 tablet (20 mEq total) by mouth daily.   pramipexole 0.25 MG tablet Commonly known as: Mirapex Take 1 tablet (0.25 mg total) by mouth 3 (three) times daily.   promethazine 12.5 MG tablet Commonly known as: PHENERGAN Take 1 tablet (12.5 mg total) by mouth every 8 (eight) hours as needed for nausea.   rivaroxaban 20 MG Tabs tablet Commonly known as: Xarelto Take 1 tablet (20 mg total) by mouth daily.   traMADol 50 MG tablet Commonly known as: Ultram Take 1 tablet (50 mg total) by mouth every 6 (six) hours as needed.   triamcinolone lotion 0.1 % Commonly known as: KENALOG Apply 1 application topically 2 (two) times daily.        Allergies:  Allergies  Allergen Reactions   Iodinated Contrast Media Nausea Only    Hypotension Hypotension   Other Nausea Only and Other (See Comments)    Contrast Dye-drops blood pressure. Contrast Dye- hypotension and nausa and syncope    Family History: Family History  Problem Relation Age of Onset   Heart attack Mother    Heart failure Father    Angina Father    Emphysema Father    Cancer Father        prostate   Lymphoma Sister        Non-Hodgkins   Cancer Other    Coronary artery disease Other        brother   Heart attack Brother    Heart disease Maternal Aunt    Colon polyps Sister    Anxiety disorder Brother     Social History:  reports that he has quit smoking. His smoking use included cigarettes. He has a 20.00 pack-year smoking history. He has never used smokeless tobacco. He reports that he does not drink alcohol and does  not use drugs.   Physical Exam: BP 138/60    Pulse 76    Ht 5\' 8"  (1.727 m)    Wt 195 lb (  88.5 kg)    BMI 29.65 kg/m   Constitutional:  Alert and oriented, No acute distress. HEENT: [place] AT, moist mucus membranes.  Trachea midline, no masses. Cardiovascular: No clubbing, cyanosis, or edema. Respiratory: Normal respiratory effort, no increased work of breathing. Psychiatric: Normal mood and affect.   Assessment & Plan:    1. Benign prostatic hyperplasia with urinary frequency Stable Finasteride refilled  2.  Incomplete bladder emptying Stable Variable PVR.  PVR today was 106 mL It was 66 mL last year and 177 mL 2 years ago Will continue to follow and recheck in 1 year   Abbie Sons, [place] [address] [place], [place] [address] [phone]

## 2021-04-07 ENCOUNTER — Ambulatory Visit: Payer: PPO | Admitting: Urology

## 2021-04-07 DIAGNOSIS — E1143 Type 2 diabetes mellitus with diabetic autonomic (poly)neuropathy: Secondary | ICD-10-CM | POA: Diagnosis not present

## 2021-04-07 DIAGNOSIS — E782 Mixed hyperlipidemia: Secondary | ICD-10-CM | POA: Diagnosis not present

## 2021-04-07 DIAGNOSIS — Z862 Personal history of diseases of the blood and blood-forming organs and certain disorders involving the immune mechanism: Secondary | ICD-10-CM | POA: Diagnosis not present

## 2021-04-14 DIAGNOSIS — I10 Essential (primary) hypertension: Secondary | ICD-10-CM | POA: Diagnosis not present

## 2021-04-14 DIAGNOSIS — I5022 Chronic systolic (congestive) heart failure: Secondary | ICD-10-CM | POA: Diagnosis not present

## 2021-04-14 DIAGNOSIS — Z862 Personal history of diseases of the blood and blood-forming organs and certain disorders involving the immune mechanism: Secondary | ICD-10-CM | POA: Diagnosis not present

## 2021-04-14 DIAGNOSIS — G253 Myoclonus: Secondary | ICD-10-CM | POA: Diagnosis not present

## 2021-04-14 DIAGNOSIS — E1143 Type 2 diabetes mellitus with diabetic autonomic (poly)neuropathy: Secondary | ICD-10-CM | POA: Diagnosis not present

## 2021-04-14 DIAGNOSIS — D329 Benign neoplasm of meninges, unspecified: Secondary | ICD-10-CM | POA: Diagnosis not present

## 2021-04-14 DIAGNOSIS — N289 Disorder of kidney and ureter, unspecified: Secondary | ICD-10-CM | POA: Diagnosis not present

## 2021-04-14 DIAGNOSIS — E782 Mixed hyperlipidemia: Secondary | ICD-10-CM | POA: Diagnosis not present

## 2021-05-30 NOTE — Progress Notes (Signed)
Cardiology Office Note ? ?Date:  05/31/2021  ? ?ID:  Kevin Choi, DOB 10-01-1940, MRN [id number] ? ?PCP:  [name], MD  ? ?Chief Complaint  ?Patient presents with  ? 3 month follow up   ?  Patient c/o LE edema and shortness of breath with over exertion. Medications reviewed by the patient verbally.   ? ? ? ?HPI:  ?81 year old gentleman with a history of  ?coronary artery disease,  ?bypass surgery with a LIMA to the LAD in 2002,  vein graft to the diagonal,  ?diabetes,  ?hypertension,  ?hyperlipidemia,  ?pleural effusion requiring a VATS,  ?carotid endarterectomy on the right in 2001,  ?TIA symptoms 3 times, once in 2002, October 2012 in March 2013. ?Status post bilateral total knee replacements ?40% left carotid arterial disease ?resection of right frontal meningioma. ?On xarelto for TIAs ?EF 40 to 45% in 2019, down to 35 to 40% in 2022 ?who presents for routine followup of his coronary artery disease ,  leg swelling and  ?shortness of breath. ? ?LOV 02/2021 ? ?Echo, reviewed ?EF 35 to 40% on 12/22 ?In 2019 was 40 to 34% ? ?Stress test 1/23: no ischemia ? ?Recently seen by primary care ?Taken off hydralazine, ?Back on quinalpril 30 daily ?Renal function stable ? ?Blood pressures reviewed, well controlled 105 up to 712W systolic ? ?Still with trace leg swelling, "trouble getting shoes on" ?Torsemide did not work as well ?Remains on Lasix 40 twice daily ? ?Denies anginal symptoms ?Active with no regular exercise program ? ?Prior history of tremor/tremble ?Taking mirapex ? ?Seen in clinic 01/29/2021: BP low at the time ?Quinapril and metolazone held ?CR 2.19 ? ?EKG personally reviewed by myself on todays visit ?Sinus bradycardia rate 49 bpm left bundle branch block, sinus arrhythmia ?No change ? ?Other past medical history reviewed ?Numb left arm, 03/2019 ?Left hands fingers not moving well going through folders ?Numbness got worse ?Left lip numb ?Lasted 45 min, then resolved without intervention ?No further  episodes, etiology unclear ? ?Previous MRI/MRA from 2019 reviewed ?Meningioma noted, chronic small vessel disease noted ? ?05/2019 ?Carotid u/s, results reviewed ?left ICA are consistent with a 40-59% stenosis. ?Right is open, s/p CEA ? ?Echo 2019 ?- Left ventricle: The cavity size was normal. There was moderate  ?  concentric hypertrophy. Systolic function was mildly to  ?  moderately reduced. The estimated ejection fraction was in the  ?  range of 40% to 45%.  ? ?Previous hearing problem,  led to MRI of the brain ?Discovery of mass right side of the brain ?Evaluated at [facility] found to have meningioma ?Had resection ? 04/21/2017 surgery resection   ? ? May 14 2011  he had acute onset of slurred speech. He was playing cards shortly after and he was unable to do any math. Symptoms lasted for approximately one to 2 hours. Blood pressure prior to this event had been well controlled though during and after the event, he had severe hypertension with systolic pressures sometimes greater than 200. He was in the emergency room for 2 days with extensive testing. ? ? ?PMH:   has a past medical history of Allergic rhinitis due to pollen, CAD (coronary artery disease) (2002), Chicken pox, Diabetes ([place]), Diabetes mellitus, Dysplastic nevus (01/25/2008), Elevated prostate specific antigen (PSA) (02/21/2001), Headache(784.0), Hemiplegic migraine, without mention of intractable migraine without mention of status migrainosus, HLD (hyperlipidemia), HTN (hypertension) (05/08/2007), Hyperhidrosis, Impotence of organic origin (07/24/2007), Leg pain, Measles, Mumps, Nausea with vomiting, Neuropathy, Occlusion  and stenosis of carotid artery without mention of cerebral infarction (09/15/2006), Peripheral neuropathy (01/31/2008), Peripheral vascular disease ([place]), Personal history of urinary calculi, Pleural effusion (2007), Restless legs syndrome (RLS) (01/10/2007), Slurred speech, and TIA (transient ischemic attack). ? ?PSH:     ?Past Surgical History:  ?Procedure Laterality Date  ? CAROTID ENDARTERECTOMY  2001  ? right  ? CATHETER REMOVAL Right 06/03/2019  ? CHOLECYSTECTOMY  2003  ? collapse lung  2007  ? CORONARY ARTERY BYPASS GRAFT  2001  ? KNEE SURGERY Left 1981  ? LUNG SURGERY  2007  ? Left; decortication left lung for persistant pleural effusion and PTX. admission ten days at [facility].  ? PAROTID GLAND TUMOR EXCISION  1973  ? VASECTOMY    ? ? ?Current Outpatient Medications  ?Medication Sig Dispense Refill  ? acetaminophen (TYLENOL) 500 MG tablet Take 500 mg by mouth every 6 (six) hours as needed. Patient states he can take 2 tablets daily as needed for pain    ? atorvastatin (LIPITOR) 40 MG tablet Take 1 tablet (40 mg total) by mouth daily. 90 tablet 3  ? cetirizine (ZYRTEC) 10 MG tablet Take 10 mg by mouth as needed.     ? finasteride (PROSCAR) 5 MG tablet Take 1 tablet (5 mg total) by mouth daily. 90 tablet 3  ? furosemide (LASIX) 20 MG tablet Take 2 tablets (40 mg total) by mouth 2 (two) times daily. 90 tablet 3  ? gabapentin (NEURONTIN) 300 MG capsule Take 300 mg by mouth 3 (three) times daily.    ? glucose blood test strip     ? ketoconazole (NIZORAL) 2 % shampoo Apply to scalp as directed. Leave in for 10 minutes before rinsing. 120 mL 2  ? magnesium gluconate (MAGONATE) 500 MG tablet Take 500 mg by mouth daily.    ? metFORMIN (GLUCOPHAGE) 500 MG tablet Take 500 mg by mouth 2 (two) times daily with a meal.    ? metoprolol succinate (TOPROL-XL) 50 MG 24 hr tablet Take 0.5 tablets (25 mg total) by mouth daily. 45 tablet 3  ? Multiple Vitamin (MULTI-VITAMIN) tablet Take by mouth.    ? nitroGLYCERIN (NITROSTAT) 0.4 MG SL tablet Place 1 tablet (0.4 mg total) under the tongue every 5 (five) minutes as needed for chest pain. 25 tablet 3  ? potassium chloride SA (KLOR-CON) 20 MEQ tablet Take 1 tablet (20 mEq total) by mouth daily. 90 tablet 3  ? pramipexole (MIRAPEX) 0.25 MG tablet Take 1 tablet (0.25 mg total) by mouth 3 (three) times  daily. 270 tablet 3  ? promethazine (PHENERGAN) 12.5 MG tablet Take 1 tablet (12.5 mg total) by mouth every 8 (eight) hours as needed for nausea. 20 tablet 0  ? quinapril (ACCUPRIL) 20 MG tablet Take 30 mg by mouth daily.    ? rivaroxaban (XARELTO) 20 MG TABS tablet Take 1 tablet (20 mg total) by mouth daily. 90 tablet 1  ? traMADol (ULTRAM) 50 MG tablet Take 1 tablet (50 mg total) by mouth every 6 (six) hours as needed. 15 tablet 0  ? triamcinolone lotion (KENALOG) 0.1 % Apply 1 application topically 2 (two) times daily.    ? ?No current facility-administered medications for this visit.  ? ? ?Allergies:   Iodinated contrast media and Other  ? ?Social History:  The patient  reports that he has quit smoking. His smoking use included cigarettes. He has a 20.00 pack-year smoking history. He has never used smokeless tobacco. He reports that he does not drink  alcohol and does not use drugs.  ? ?Family History:   family history includes Angina in his father; Anxiety disorder in his brother; Cancer in his father and another family member; Colon polyps in his sister; Coronary artery disease in an other family member; Emphysema in his father; Heart attack in his brother and mother; Heart disease in his maternal aunt; Heart failure in his father; Lymphoma in his sister.  ? ? ?Review of Systems: ?Review of Systems  ?Constitutional: Negative.   ?HENT: Negative.    ?Respiratory:  Positive for shortness of breath.   ?Cardiovascular:  Positive for leg swelling.  ?Gastrointestinal: Negative.   ?Musculoskeletal: Negative.   ?Neurological: Negative.   ?Psychiatric/Behavioral: Negative.    ?All other systems reviewed and are negative. ? ?PHYSICAL EXAM: ?VS:  BP 110/60 (BP Location: Left Arm, Patient Position: Sitting, Cuff Size: Normal)   Pulse (!) 49   Ht 5' 8.5" (1.74 m)   Wt 91.3 kg   SpO2 98%   BMI 30.17 kg/m?  , BMI Body mass index is 30.17 kg/m?[name] ?Constitutional:  oriented to person, place, and time. No distress.  ?HENT:   ?Head: Grossly normal ?Eyes:  no discharge. No scleral icterus.  ?Neck: No JVD, no carotid bruits  ?Cardiovascular: Regular rate and rhythm, no murmurs appreciated ?Trace lower extremity edema ?Pulmonary/Chest: Clear

## 2021-05-31 ENCOUNTER — Telehealth: Payer: Self-pay | Admitting: Emergency Medicine

## 2021-05-31 ENCOUNTER — Encounter: Payer: Self-pay | Admitting: Cardiovascular Disease

## 2021-05-31 ENCOUNTER — Ambulatory Visit (INDEPENDENT_AMBULATORY_CARE_PROVIDER_SITE_OTHER): Payer: PPO | Admitting: Cardiovascular Disease

## 2021-05-31 VITALS — BP 110/60 | HR 49 | Ht 68.5 in | Wt 201.4 lb

## 2021-05-31 DIAGNOSIS — N179 Acute kidney failure, unspecified: Secondary | ICD-10-CM

## 2021-05-31 DIAGNOSIS — R55 Syncope and collapse: Secondary | ICD-10-CM

## 2021-05-31 DIAGNOSIS — I739 Peripheral vascular disease, unspecified: Secondary | ICD-10-CM

## 2021-05-31 DIAGNOSIS — R42 Dizziness and giddiness: Secondary | ICD-10-CM

## 2021-05-31 DIAGNOSIS — E1143 Type 2 diabetes mellitus with diabetic autonomic (poly)neuropathy: Secondary | ICD-10-CM

## 2021-05-31 DIAGNOSIS — I429 Cardiomyopathy, unspecified: Secondary | ICD-10-CM

## 2021-05-31 DIAGNOSIS — I10 Essential (primary) hypertension: Secondary | ICD-10-CM

## 2021-05-31 DIAGNOSIS — I428 Other cardiomyopathies: Secondary | ICD-10-CM

## 2021-05-31 DIAGNOSIS — E782 Mixed hyperlipidemia: Secondary | ICD-10-CM | POA: Diagnosis not present

## 2021-05-31 DIAGNOSIS — I6523 Occlusion and stenosis of bilateral carotid arteries: Secondary | ICD-10-CM | POA: Diagnosis not present

## 2021-05-31 DIAGNOSIS — I2510 Atherosclerotic heart disease of native coronary artery without angina pectoris: Secondary | ICD-10-CM

## 2021-05-31 DIAGNOSIS — I5033 Acute on chronic diastolic (congestive) heart failure: Secondary | ICD-10-CM | POA: Diagnosis not present

## 2021-05-31 MED ORDER — POTASSIUM CHLORIDE CRYS ER 20 MEQ PO TBCR: 30.0000 meq | EXTENDED_RELEASE_TABLET | Freq: Every day | ORAL | 3 refills | Status: DC

## 2021-05-31 MED ORDER — METOPROLOL SUCCINATE ER 50 MG PO TB24: 25.0000 mg | ORAL_TABLET | Freq: Every day | ORAL | 3 refills | Status: DC

## 2021-05-31 MED ORDER — FUROSEMIDE 40 MG PO TABS: 40.0000 mg | ORAL_TABLET | Freq: Two times a day (BID) | ORAL | 3 refills | Status: DC

## 2021-05-31 MED ORDER — RIVAROXABAN 20 MG PO TABS: 20.0000 mg | ORAL_TABLET | Freq: Every day | ORAL | 3 refills | Status: DC

## 2021-05-31 MED ORDER — DAPAGLIFLOZIN PROPANEDIOL 10 MG PO TABS: 10.0000 mg | ORAL_TABLET | Freq: Every day | ORAL | 3 refills | Status: DC

## 2021-05-31 MED ORDER — ATORVASTATIN CALCIUM 40 MG PO TABS: 40.0000 mg | ORAL_TABLET | Freq: Every day | ORAL | 3 refills | Status: DC

## 2021-05-31 NOTE — Patient Instructions (Addendum)
Medication Instructions:  ?Please start farxiga 10 mg daily ? ?Increase Potassium to 30 meq daily ? ?Restart OTC magnesium ? ?If you need a refill on your cardiac medications before your next appointment, please call your pharmacy.  ? ?Lab work: ?No new labs needed ? ?Testing/Procedures: ?No new testing needed ? ?Follow-Up: ?At [facility], you and your health needs are our priority.  As part of our continuing mission to provide you with exceptional heart care, we have created designated Provider Care Teams.  These Care Teams include your primary Cardiologist (physician) and Advanced Practice Providers (APPs -  Physician Assistants and Nurse Practitioners) who all work together to provide you with the care you need, when you need it. ? ?You will need a follow up appointment in 6 months, APP OK ? ?Providers on your designated Care Team:   ?Murray Hodgkins, NP ?Christell Faith, PA-C ?Cadence Kathlen Mody, PA-C ? ?COVID-19 Vaccine Information can be found at: [email or web address].uk For questions related to vaccine distribution or appointments, please email [email or web address] or call [phone].  ? ?

## 2021-05-31 NOTE — Telephone Encounter (Signed)
Incoming secure chat from Dr. Rockey Situ:  ? ?"Would probably recommend if he is able to get the [place] that we check a BMP in several weeks time"  ? ?Called patient to let him know. Pt agreeable. Put in order for BMP to be completed at the [facility] in 3 weeks times. Pt verbalized understanding.  ?

## 2021-06-22 ENCOUNTER — Other Ambulatory Visit
Admission: RE | Admit: 2021-06-22 | Discharge: 2021-06-22 | Disposition: A | Discharge status: Home / Self Care | Payer: PPO | Attending: Cardiovascular Disease | Admitting: Cardiovascular Disease

## 2021-06-22 DIAGNOSIS — I429 Cardiomyopathy, unspecified: Secondary | ICD-10-CM | POA: Insufficient documentation

## 2021-06-22 DIAGNOSIS — Z862 Personal history of diseases of the blood and blood-forming organs and certain disorders involving the immune mechanism: Secondary | ICD-10-CM | POA: Diagnosis not present

## 2021-06-22 DIAGNOSIS — E782 Mixed hyperlipidemia: Secondary | ICD-10-CM | POA: Diagnosis not present

## 2021-06-22 DIAGNOSIS — E1143 Type 2 diabetes mellitus with diabetic autonomic (poly)neuropathy: Secondary | ICD-10-CM | POA: Diagnosis not present

## 2021-06-22 LAB — BASIC METABOLIC PANEL
Anion gap: 9 (ref 5–15)
BUN: 19 mg/dL (ref 8–23)
CO2: 27 mmol/L (ref 22–32)
Calcium: 9.3 mg/dL (ref 8.9–10.3)
Chloride: 102 mmol/L (ref 98–111)
Creatinine, Ser: 1.35 mg/dL — ABNORMAL HIGH (ref 0.61–1.24)
GFR, Estimated: 52 mL/min — ABNORMAL LOW (ref >60)
Glucose, Bld: 144 mg/dL — ABNORMAL HIGH (ref 70–99)
Potassium: 3.9 mmol/L (ref 3.5–5.1)
Sodium: 138 mmol/L (ref 135–145)

## 2021-06-23 ENCOUNTER — Telehealth: Payer: Self-pay | Admitting: Emergency Medicine

## 2021-06-23 NOTE — Telephone Encounter (Signed)
-----   Message from Minna Merritts, MD sent at 06/22/2021  5:22 PM EDT ----- ?BMP labs reviewed ?Stable renal function, improved compared to December, close to baseline ?Normal electrolytes ?

## 2021-06-23 NOTE — Telephone Encounter (Signed)
Called and spoke with patient. Results reviewed with patient, pt verbalized understanding,  questions (if any) answered.   ?

## 2021-06-25 DIAGNOSIS — E782 Mixed hyperlipidemia: Secondary | ICD-10-CM | POA: Diagnosis not present

## 2021-06-25 DIAGNOSIS — I10 Essential (primary) hypertension: Secondary | ICD-10-CM | POA: Diagnosis not present

## 2021-06-25 DIAGNOSIS — N1831 Chronic kidney disease, stage 3a: Secondary | ICD-10-CM | POA: Insufficient documentation

## 2021-06-25 DIAGNOSIS — E1143 Type 2 diabetes mellitus with diabetic autonomic (poly)neuropathy: Secondary | ICD-10-CM | POA: Diagnosis not present

## 2021-06-25 DIAGNOSIS — E6609 Other obesity due to excess calories: Secondary | ICD-10-CM | POA: Diagnosis not present

## 2021-06-25 DIAGNOSIS — Z6831 Body mass index (BMI) 31.0-31.9, adult: Secondary | ICD-10-CM | POA: Diagnosis not present

## 2021-06-25 DIAGNOSIS — Z0000 Encounter for general adult medical examination without abnormal findings: Secondary | ICD-10-CM | POA: Diagnosis not present

## 2021-06-25 DIAGNOSIS — Z862 Personal history of diseases of the blood and blood-forming organs and certain disorders involving the immune mechanism: Secondary | ICD-10-CM | POA: Diagnosis not present

## 2021-06-28 IMAGING — CR DG HIP (WITH OR WITHOUT PELVIS) 2-3V*R*
1 series · 3 of 3 positions shown · non-contrast
Comparison: 04/27/2011

CLINICAL DATA: Right hip pain.  No fall.

EXAM:
DG HIP (WITH OR WITHOUT PELVIS) 2-3V RIGHT

[Series 1: dg hip unilat w or w/o pelvis 2-3 views  · non-contrast · 0.14mm/px · 3 of 3 slices shown]
[im 1/3]
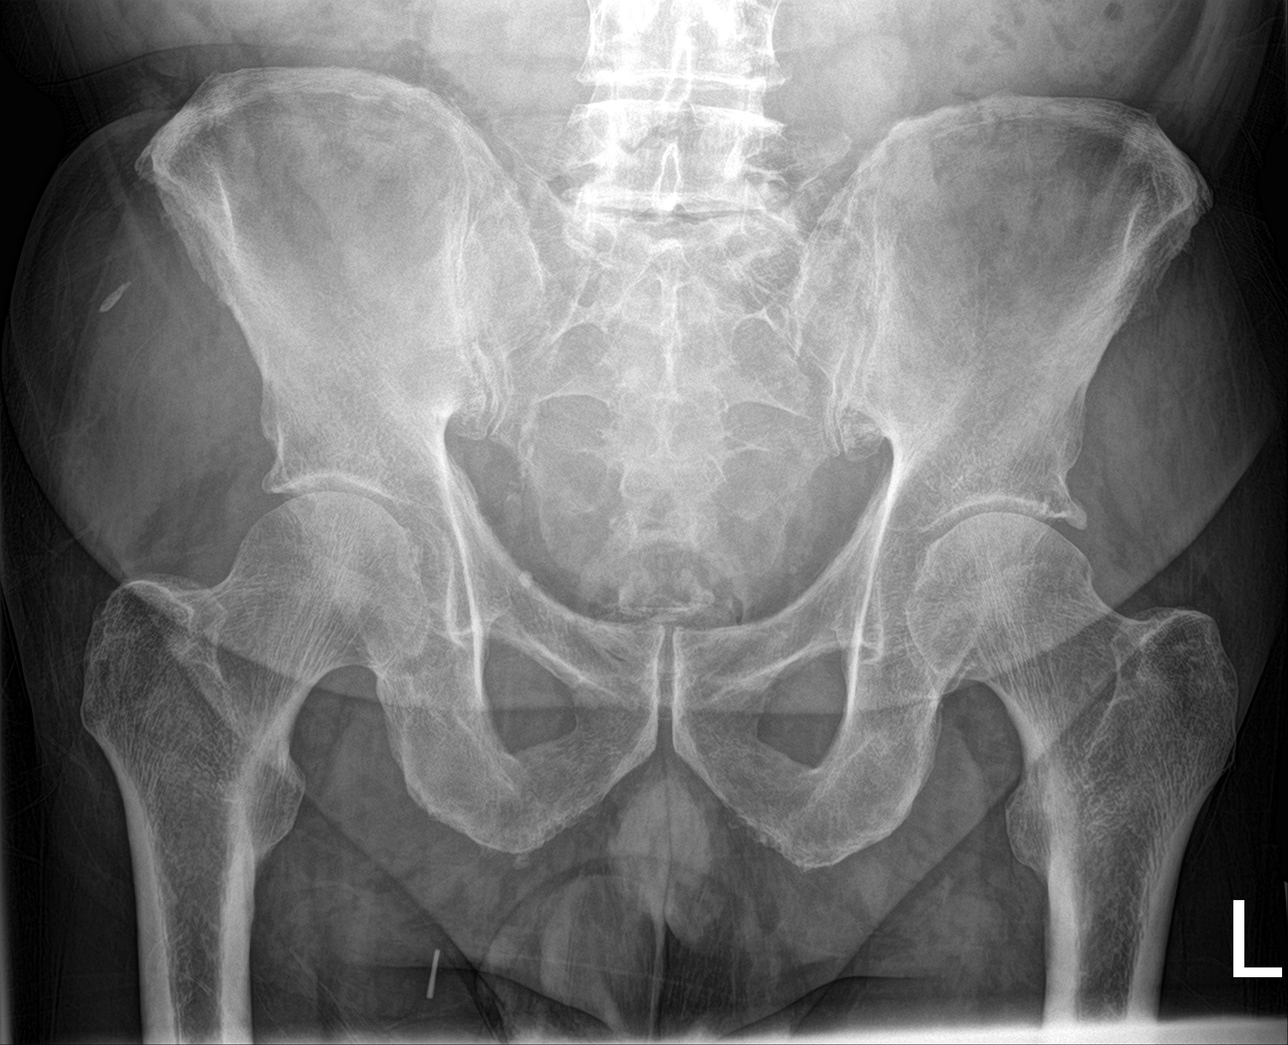
[im 2/3]
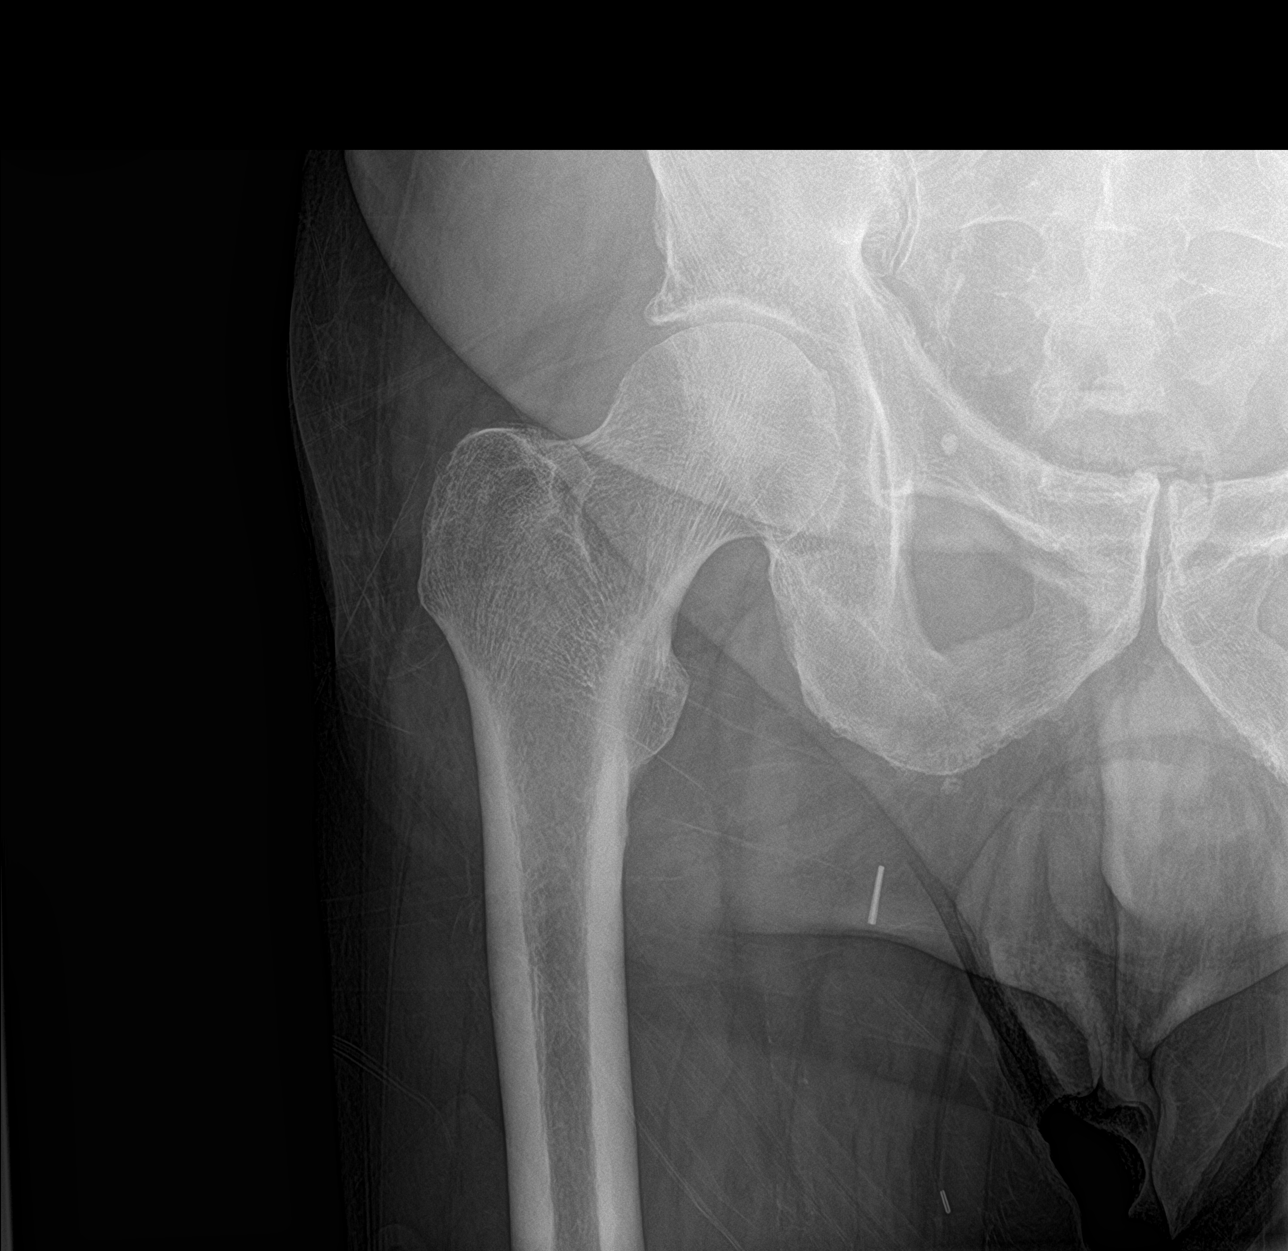
[im 3/3]
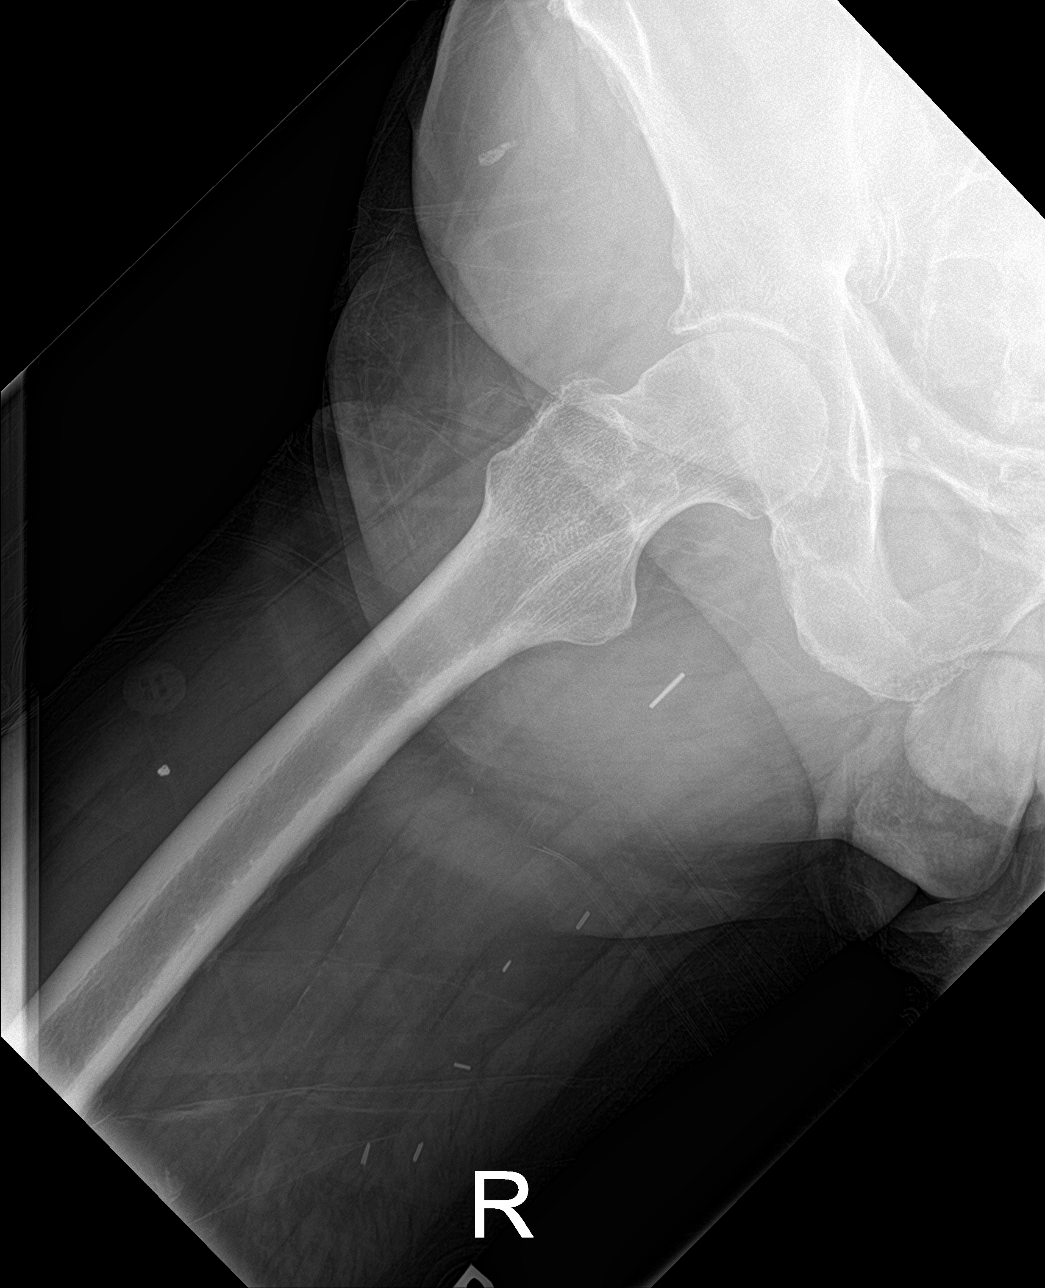

[3 of 3 positions shown; findings below may reference images not displayed]

FINDINGS: Femoral heads are located. Joint spaces are maintained for age.
Sacroiliac joints are symmetric. Degenerate disc disease at the
lumbosacral junction, suboptimally evaluated. No acute fracture.
IMPRESSION: No acute osseous abnormality.

## 2021-08-31 ENCOUNTER — Telehealth: Payer: Self-pay | Admitting: Cardiovascular Disease

## 2021-08-31 NOTE — Telephone Encounter (Signed)
Pt c/o medication issue:  1. Name of Medication: quinapril (ACCUPRIL) 20 MG tablet  2. How are you currently taking this medication (dosage and times per day)? Take 30 mg by mouth daily.  3. Are you having a reaction (difficulty breathing--STAT)? No   4. What is your medication issue? Kevin Choi stated that they no longer make the medication quinapril (ACCUPRIL) 20 MG tablet and wants to know an alternative for the patient to take. Would like a call back to discuss

## 2021-08-31 NOTE — Telephone Encounter (Signed)
Advised patient to call other local pharmacies to see if any of them may be carrying quinapril. The patient states his pharmacist told him it is no longer being manufactured so he would like to get a substitute. Please advise.

## 2021-08-31 NOTE — Telephone Encounter (Signed)
New Message:    Pharmacist called and said they will no longer be getting Quinapril. He wants to know what does Dr Rockey Situ want to use in the place of the Quinapril?

## 2021-08-31 NOTE — Telephone Encounter (Signed)
Patient was returning call. Please call

## 2021-08-31 NOTE — Telephone Encounter (Signed)
Left voicemail message advising patient to check other local pharmacies in his area and if he is still unable to get it to call our office back to discuss other options.

## 2021-09-01 MED ORDER — LOSARTAN POTASSIUM 50 MG PO TABS: 50.0000 mg | ORAL_TABLET | Freq: Every day | ORAL | 1 refills | Status: DC

## 2021-09-01 NOTE — Telephone Encounter (Signed)
Requested Prescriptions   Signed Prescriptions Disp Refills   losartan (COZAAR) 50 MG tablet 30 tablet 1    Sig: Take 1 tablet (50 mg total) by mouth daily.    Authorizing Provider: Minna Merritts    Ordering User: Raelene Bott, Zaira Iacovelli L

## 2021-10-07 DIAGNOSIS — E1143 Type 2 diabetes mellitus with diabetic autonomic (poly)neuropathy: Secondary | ICD-10-CM | POA: Diagnosis not present

## 2021-10-07 DIAGNOSIS — E782 Mixed hyperlipidemia: Secondary | ICD-10-CM | POA: Diagnosis not present

## 2021-10-11 ENCOUNTER — Other Ambulatory Visit: Payer: Self-pay | Admitting: Cardiovascular Disease

## 2021-10-14 DIAGNOSIS — Z0000 Encounter for general adult medical examination without abnormal findings: Secondary | ICD-10-CM | POA: Diagnosis not present

## 2021-10-14 DIAGNOSIS — Z6831 Body mass index (BMI) 31.0-31.9, adult: Secondary | ICD-10-CM | POA: Diagnosis not present

## 2021-10-14 DIAGNOSIS — E782 Mixed hyperlipidemia: Secondary | ICD-10-CM | POA: Diagnosis not present

## 2021-10-14 DIAGNOSIS — N1831 Chronic kidney disease, stage 3a: Secondary | ICD-10-CM | POA: Diagnosis not present

## 2021-10-14 DIAGNOSIS — I25118 Atherosclerotic heart disease of native coronary artery with other forms of angina pectoris: Secondary | ICD-10-CM | POA: Diagnosis not present

## 2021-10-14 DIAGNOSIS — Z23 Encounter for immunization: Secondary | ICD-10-CM | POA: Diagnosis not present

## 2021-10-14 DIAGNOSIS — E6609 Other obesity due to excess calories: Secondary | ICD-10-CM | POA: Diagnosis not present

## 2021-10-14 DIAGNOSIS — Z862 Personal history of diseases of the blood and blood-forming organs and certain disorders involving the immune mechanism: Secondary | ICD-10-CM | POA: Diagnosis not present

## 2021-10-14 DIAGNOSIS — I10 Essential (primary) hypertension: Secondary | ICD-10-CM | POA: Diagnosis not present

## 2021-10-14 DIAGNOSIS — E1143 Type 2 diabetes mellitus with diabetic autonomic (poly)neuropathy: Secondary | ICD-10-CM | POA: Diagnosis not present

## 2021-11-25 ENCOUNTER — Emergency Department
Admission: EM | Admit: 2021-11-25 | Discharge: 2021-11-26 | Discharge status: Against Medical Advice | Payer: PPO | Attending: Emergency Medicine | Admitting: Emergency Medicine

## 2021-11-25 ENCOUNTER — Encounter: Payer: Self-pay | Admitting: *Deleted

## 2021-11-25 ENCOUNTER — Other Ambulatory Visit: Payer: Self-pay

## 2021-11-25 DIAGNOSIS — D329 Benign neoplasm of meninges, unspecified: Secondary | ICD-10-CM | POA: Diagnosis not present

## 2021-11-25 DIAGNOSIS — Z5321 Procedure and treatment not carried out due to patient leaving prior to being seen by health care provider: Secondary | ICD-10-CM | POA: Insufficient documentation

## 2021-11-25 DIAGNOSIS — G43909 Migraine, unspecified, not intractable, without status migrainosus: Secondary | ICD-10-CM | POA: Diagnosis not present

## 2021-11-25 DIAGNOSIS — I2510 Atherosclerotic heart disease of native coronary artery without angina pectoris: Secondary | ICD-10-CM | POA: Diagnosis not present

## 2021-11-25 DIAGNOSIS — E119 Type 2 diabetes mellitus without complications: Secondary | ICD-10-CM | POA: Insufficient documentation

## 2021-11-25 DIAGNOSIS — R519 Headache, unspecified: Secondary | ICD-10-CM | POA: Diagnosis not present

## 2021-11-25 DIAGNOSIS — I10 Essential (primary) hypertension: Secondary | ICD-10-CM | POA: Diagnosis not present

## 2021-11-25 DIAGNOSIS — G9389 Other specified disorders of brain: Secondary | ICD-10-CM | POA: Diagnosis not present

## 2021-11-25 DIAGNOSIS — R4781 Slurred speech: Secondary | ICD-10-CM | POA: Diagnosis not present

## 2021-11-25 LAB — BASIC METABOLIC PANEL
Anion gap: 8 (ref 5–15)
BUN: 22 mg/dL (ref 8–23)
CO2: 28 mmol/L (ref 22–32)
Calcium: 9.3 mg/dL (ref 8.9–10.3)
Chloride: 103 mmol/L (ref 98–111)
Creatinine, Ser: 1.41 mg/dL — ABNORMAL HIGH (ref 0.61–1.24)
GFR, Estimated: 49 mL/min — ABNORMAL LOW (ref >60)
Glucose, Bld: 124 mg/dL — ABNORMAL HIGH (ref 70–99)
Potassium: 4 mmol/L (ref 3.5–5.1)
Sodium: 139 mmol/L (ref 135–145)

## 2021-11-25 LAB — CBC
HCT: 44.3 % (ref 39.0–52.0)
Hemoglobin: 13.7 g/dL (ref 13.0–17.0)
MCH: 27.7 pg (ref 26.0–34.0)
MCHC: 30.9 g/dL (ref 30.0–36.0)
MCV: 89.7 fL (ref 80.0–100.0)
Platelets: 269 10*3/uL (ref 150–400)
RBC: 4.94 MIL/uL (ref 4.22–5.81)
RDW: 14.1 % (ref 11.5–15.5)
WBC: 9.4 10*3/uL (ref 4.0–10.5)
nRBC: 0 % (ref 0.0–0.2)

## 2021-11-25 NOTE — ED Triage Notes (Signed)
First Nurse Note:  Pt via [facility] from [facility]. Pt needs further evaluation for headache for the past 4 days. Pt has a hx of meningioma in 2019. Pt states he feels a lot of pressure in his L eye. Pt is A&Ox4 and NAD

## 2021-11-25 NOTE — ED Triage Notes (Signed)
Pt sent from [facility] for eval of headache .  No n/v  headache for 4 days.  Pt alert  speech clear.

## 2021-11-26 ENCOUNTER — Emergency Department
Admission: EM | Admit: 2021-11-26 | Discharge: 2021-11-26 | Disposition: A | Discharge status: Home / Self Care | Payer: PPO | Source: Home / Self Care | Attending: Emergency Medicine | Admitting: Emergency Medicine

## 2021-11-26 ENCOUNTER — Emergency Department: Payer: PPO

## 2021-11-26 DIAGNOSIS — E119 Type 2 diabetes mellitus without complications: Secondary | ICD-10-CM | POA: Insufficient documentation

## 2021-11-26 DIAGNOSIS — R4781 Slurred speech: Secondary | ICD-10-CM | POA: Diagnosis not present

## 2021-11-26 DIAGNOSIS — I10 Essential (primary) hypertension: Secondary | ICD-10-CM | POA: Insufficient documentation

## 2021-11-26 DIAGNOSIS — D329 Benign neoplasm of meninges, unspecified: Secondary | ICD-10-CM | POA: Insufficient documentation

## 2021-11-26 DIAGNOSIS — G43909 Migraine, unspecified, not intractable, without status migrainosus: Secondary | ICD-10-CM | POA: Insufficient documentation

## 2021-11-26 DIAGNOSIS — R519 Headache, unspecified: Secondary | ICD-10-CM | POA: Diagnosis not present

## 2021-11-26 DIAGNOSIS — I2510 Atherosclerotic heart disease of native coronary artery without angina pectoris: Secondary | ICD-10-CM | POA: Insufficient documentation

## 2021-11-26 DIAGNOSIS — G9389 Other specified disorders of brain: Secondary | ICD-10-CM | POA: Diagnosis not present

## 2021-11-26 LAB — CBC WITH DIFFERENTIAL/PLATELET
Abs Immature Granulocytes: 0.02 10*3/uL (ref 0.00–0.07)
Basophils Absolute: 0 10*3/uL (ref 0.0–0.1)
Basophils Relative: 1 %
Eosinophils Absolute: 0.2 10*3/uL (ref 0.0–0.5)
Eosinophils Relative: 2 %
HCT: 43 % (ref 39.0–52.0)
Hemoglobin: 13.7 g/dL (ref 13.0–17.0)
Immature Granulocytes: 0 %
Lymphocytes Relative: 29 %
Lymphs Abs: 2.3 10*3/uL (ref 0.7–4.0)
MCH: 28.5 pg (ref 26.0–34.0)
MCHC: 31.9 g/dL (ref 30.0–36.0)
MCV: 89.6 fL (ref 80.0–100.0)
Monocytes Absolute: 0.7 10*3/uL (ref 0.1–1.0)
Monocytes Relative: 9 %
Neutro Abs: 4.9 10*3/uL (ref 1.7–7.7)
Neutrophils Relative %: 59 %
Platelets: 257 10*3/uL (ref 150–400)
RBC: 4.8 MIL/uL (ref 4.22–5.81)
RDW: 14 % (ref 11.5–15.5)
WBC: 8.2 10*3/uL (ref 4.0–10.5)
nRBC: 0 % (ref 0.0–0.2)

## 2021-11-26 LAB — BASIC METABOLIC PANEL
Anion gap: 10 (ref 5–15)
BUN: 21 mg/dL (ref 8–23)
CO2: 21 mmol/L — ABNORMAL LOW (ref 22–32)
Calcium: 9.3 mg/dL (ref 8.9–10.3)
Chloride: 106 mmol/L (ref 98–111)
Creatinine, Ser: 1.3 mg/dL — ABNORMAL HIGH (ref 0.61–1.24)
GFR, Estimated: 55 mL/min — ABNORMAL LOW (ref >60)
Glucose, Bld: 124 mg/dL — ABNORMAL HIGH (ref 70–99)
Potassium: 4.1 mmol/L (ref 3.5–5.1)
Sodium: 137 mmol/L (ref 135–145)

## 2021-11-26 MED ORDER — METOCLOPRAMIDE HCL 10 MG PO TABS: 10.0000 mg | ORAL_TABLET | Freq: Three times a day (TID) | ORAL | 0 refills | Status: AC | PRN

## 2021-11-26 MED ORDER — METOCLOPRAMIDE HCL 5 MG/ML IJ SOLN
10.0000 mg | Freq: Once | INTRAMUSCULAR | Status: AC
  Administered 2021-11-26: 10 mg via INTRAVENOUS
  Filled 2021-11-26: qty 2

## 2021-11-26 MED ORDER — DIPHENHYDRAMINE HCL 50 MG/ML IJ SOLN
25.0000 mg | Freq: Once | INTRAMUSCULAR | Status: AC
  Administered 2021-11-26: 25 mg via INTRAVENOUS
  Filled 2021-11-26: qty 1

## 2021-11-26 NOTE — Discharge Instructions (Addendum)
You may take the Reglan as prescribed for the headache as well as over-the-counter Tylenol.  Follow-up with your neurosurgeon at [facility] and with your primary care doctor.  Return to the ER for new, worsening, or persistent severe headache, vision changes, weakness or numbness, difficulty speaking, any difficulty with coordination or walking, or any other new or worsening symptoms that concern you.  Your MRI report is as follows:  IMPRESSION:  1. No acute intracranial abnormality.  2. Mild interval increase in size of the right cerebellopontine  angle lesion, may represent a meningioma versus schwannoma.  3. Stable 5 mm mass lesion within the left internal auditory canal  fundus, consistent with a vestibular schwannoma.  4. Area of encephalomalacia and gliosis in the right temporal lobe  and right frontal operculum, adjacent to the area of craniotomy,  unchanged.

## 2021-11-26 NOTE — ED Triage Notes (Addendum)
Pt arrives with c/o headache since Sunday. Pt denies n/v or blurry vision. Per wife, pt did have an episode of slurred speech and dysarthria 2 days ago.

## 2021-11-26 NOTE — ED Provider Notes (Signed)
[facility] Provider Note    Event Date/Time   First MD Initiated Contact with Patient 11/26/21 1311     (approximate)   History   Headache   HPI  Kevin Choi is a 81 y.o. male with history of complex migraine, possible TIA, diabetes, hypertension, hyperlipidemia, CAD, and carotid artery disease who presents with a headache for the last 5 days.  The headache is mainly left-sided and behind and above the left eye.  It waxes and wanes and there have been moments over the last week where he has not felt the headache.  He denies any weakness or numbness, difficulty with walking or balance, vision changes, or any fever or neck stiffness.  He had 1 episode about 2 days ago where he had some difficulty speaking or garbled speech for an hour or 2 in the morning.  This spontaneously resolved.  He states that headache does feel somewhat similar to prior complex migraines.    Physical Exam   Triage Vital Signs: ED Triage Vitals  Enc Vitals Group     BP 11/26/21 1141 (!) 147/68     Pulse Rate 11/26/21 1141 69     Resp 11/26/21 1141 18     Temp 11/26/21 1141 (!) 97.5 F (36.4 C)     Temp src --      SpO2 11/26/21 1141 94 %     Weight 11/26/21 1142 194 lb (88 kg)     Height 11/26/21 1142 '5\' 8"'$  (1.727 m)     Head Circumference --      Peak Flow --      Pain Score 11/26/21 1142 5     Pain Loc --      Pain Edu? --      Excl. in [place]? --     Most recent vital signs: Vitals:   11/26/21 1141 11/26/21 1550  BP: (!) 147/68   Pulse: 69   Resp: 18   Temp: (!) 97.5 F (36.4 C) 98 F (36.7 C)  SpO2: 94%      General: Alert and oriented, well-appearing. CV:  Good peripheral perfusion.  Resp:  Normal effort.  Abd:  No distention.  Other:  EOMI.  PERRLA.  Cranial nerves III through XII grossly intact (except for right eyebrow droop which the patient reports is chronic).  Motor intact in all extremities.  No pronator drift.  No ataxia on finger-to-nose.  Normal  gait.  Neck supple, full range of motion.  No meningeal signs.   ED Results / Procedures / Treatments   Labs (all labs ordered are listed, but only abnormal results are displayed) Labs Reviewed  BASIC METABOLIC PANEL - Abnormal; Notable for the following components:      Result Value   CO2 21 (*)    Glucose, Bld 124 (*)    Creatinine, Ser 1.30 (*)    GFR, Estimated 55 (*)    All other components within normal limits  CBC WITH DIFFERENTIAL/PLATELET     EKG     RADIOLOGY  CT head: I independently viewed and interpreted the images; there is no ICH.  Radiology report indicates no acute findings.  MR brain:   IMPRESSION:  1. No acute intracranial abnormality.  2. Mild interval increase in size of the right cerebellopontine  angle lesion, may represent a meningioma versus schwannoma.  3. Stable 5 mm mass lesion within the left internal auditory canal  fundus, consistent with a vestibular schwannoma.  4. Area of  encephalomalacia and gliosis in the right temporal lobe  and right frontal operculum, adjacent to the area of craniotomy,  unchanged.     PROCEDURES:  Critical Care performed: No  Procedures   MEDICATIONS ORDERED IN ED: Medications  metoCLOPramide (REGLAN) injection 10 mg (10 mg Intravenous Given 11/26/21 1449)  diphenhydrAMINE (BENADRYL) injection 25 mg (25 mg Intravenous Given 11/26/21 1449)     IMPRESSION / MDM / ASSESSMENT AND PLAN / ED COURSE  I reviewed the triage vital signs and the nursing notes.  81 year old male with PMH as noted above presents with a headache for the last 5 days.  He also had a brief episode of speech disturbance a few days ago which resolved spontaneously.  Physical exam is currently unremarkable.  The patient is well-appearing.  His vital signs are normal.  Thorough neurologic exam is normal.  BMP and CBC are both unremarkable.  The patient has no leukocytosis.  CT head shows no acute findings.  Differential diagnosis  includes, but is not limited to, complex migraine, tension headache, other benign headache etiology.  There is no clinical evidence for meningitis.  I have an extremely low suspicion for intracranial hemorrhage or aneurysm given the waxing and waning symptoms and the normal neurologic exam.  The speech disturbance is concerning for possible TIA, however it is now already been a few days and it has resolved.  The patient has had extensive neurologic work-up in the past and has had several prior episodes which were thought to be either TIA or deficits due to complex migraines.  I counseled the patient on the results of the work-up so far.  Because of that episode I recommended obtaining an MRI to rule out any small acute to subacute stroke.  If this is negative then the patient will be appropriate for discharge home with outpatient follow-up.  I will also give Reglan and Benadryl for symptomatic treatment in the meantime.  Patient's presentation is most consistent with acute presentation with potential threat to life or bodily function.  ----------------------------------------- 4:05 PM on 11/26/2021 -----------------------------------------  MR brain shows no acute findings.  The patient has a small meningioma or schwannoma that has increased 2 mm in the last 4 years with no mass effect or other concerning findings.  The patient had significant relief with Reglan and states he feels much better.  He is eager to leave.  Given the negative MRI and improved symptoms, the patient is appropriate for discharge home.  He already has a Publishing rights manager at [facility] that he follows with and will see him about the meningioma.  I counseled the patient on the results of the work-up.  I gave him thorough return precautions and he expressed understanding.  I have prescribed some p.o. Reglan as well.   FINAL CLINICAL IMPRESSION(S) / ED DIAGNOSES   Final diagnoses:  Migraine without status migrainosus, not intractable,  unspecified migraine type  Meningioma ([place])     Rx / DC Orders   ED Discharge Orders          Ordered    metoCLOPramide (REGLAN) 10 MG tablet  Every 8 hours PRN        11/26/21 1536             Note:  This document was prepared using Dragon voice recognition software and may include unintentional dictation errors.    Arta Silence, MD 11/26/21 [phone]

## 2021-12-01 ENCOUNTER — Telehealth: Payer: Self-pay

## 2021-12-01 NOTE — Telephone Encounter (Signed)
     Reason for call: ED-Follow up call   Patient visited [facility] on 11/26/2021    Telephone encounter attempt :  1st Attempt  A HIPAA compliant voice message was left requesting a return call.  Instructed patient to call back at [phone] at their earliest convenience.  [place] management  [place], [place] [employer]  Main Phone: [phone]  E-mail: [email or web address]  Website: [email or web address]

## 2021-12-03 ENCOUNTER — Telehealth: Payer: Self-pay

## 2021-12-03 NOTE — Telephone Encounter (Signed)
     Patient  visited [facility] on 11/26/2021  for Headache   Telephone encounter attempt :  2nd Attempt  A HIPAA compliant voice message was left requesting a return call.  Instructed patient to call back at [phone] at their earliest convenience.  [place] management  [place], [place]  Main Phone: [phone]  E-mail: [email or web address]  Website: [email or web address]

## 2021-12-06 ENCOUNTER — Ambulatory Visit: Payer: Self-pay

## 2021-12-06 ENCOUNTER — Telehealth: Payer: Self-pay

## 2021-12-06 NOTE — Patient Instructions (Signed)
Visit Information  Thank you for taking time to visit with me today. Please don't hesitate to contact me if I can be of assistance to you.   Following are the goals we discussed today:   Goals Addressed             This Visit's Progress    COMPLETED: RNCM: Effective Mangement of Chronic Conditions       Care Coordination Interventions: Evaluation of current treatment plan related to chronic conditions and effective management of chronic conditions and patient's adherence to plan as established by provider Advised patient to call the office for changes, new needs, and concerns Screening for signs and symptoms of depression related to chronic disease state  Assessed social determinant of health barriers Review of the purpose of the call and the RNCM role. The patient feels he is doing well and denies any new concerns at this time. Knows to call for changes or new needs.              Please call the care guide team at [phone] if you need to schedule an appointment.   If you are experiencing a Mental Health or [place] or need someone to talk to, please call the Suicide and Crisis Lifeline: 988 call the [place] National Suicide Prevention Lifeline: 2192543116 or TTY: 262-062-0312 TTY 971-715-1249) to talk to a trained counselor call 1-800-273-TALK (toll free, 24 hour hotline)  Patient verbalizes understanding of instructions and care plan provided today and agrees to view in [place]. Active MyChart status and patient understanding of how to access instructions and care plan via MyChart confirmed with patient.     No further follow up required: the patient knows to call for new needs or concerns.   Noreene Larsson RN, MSN, [facility]  Mobile: [phone]

## 2021-12-06 NOTE — Telephone Encounter (Signed)
    Left message 3 times, will close follow up.   Patient  visited [facility] on 11/26/2021  for Headache   Telephone encounter attempt :  3rd attempt  A HIPAA compliant voice message was left requesting a return call.  Instructed patient to call back at [phone] at their earliest convenience.  [place] management  [place], [place]  Main Phone: [phone]  E-mail: [email or web address]  Website: [email or web address]

## 2021-12-12 NOTE — Progress Notes (Unsigned)
Cardiology Office Note  Date:  12/13/2021   ID:  Kevin Choi, DOB October 13, 1940, MRN [id number]  PCP:  [name], MD   Chief Complaint  Patient presents with   6 month follow up     Patient c/o shortness of breath with over exertion. Medications reviewed by the patient verbally.      HPI:  81 year old gentleman with a history of  coronary artery disease,  bypass surgery with a LIMA to the LAD in 2002,  vein graft to the diagonal,  diabetes,  hypertension,  hyperlipidemia,  pleural effusion requiring a VATS,  carotid endarterectomy on the right in 2001,  TIA symptoms 3 times, once in 2002, October 2012 in March 2013. Status post bilateral total knee replacements 40% left carotid arterial disease resection of right frontal meningioma. On xarelto for TIAs EF 40 to 45% in 2019, down to 35 to 40% in 2022 who presents for routine followup of his coronary artery disease ,  leg swelling and  shortness of breath.  LOV April 2023  11/2021 in ER with migraine MR brain shows no acute findings small meningioma or schwannoma that has increased 2 mm in the last 4 years Given reglan  Taking lasix 40 in the PM Skipping the AM, Mild leg edema Incontinence, uses a pad  Denies chest pain concerning for angina No regular exercise program  Difficulty affording Farxiga and Xarelto  Labs  A1c 7.4 Total chol 144, LDL 73 CR 1.3 to 1.5  EKG personally reviewed by myself on todays visit Normal sinus rhythm with left bundle branch block rate 61 bpm  Past medical history reviewed Echo,  EF 35 to 40% on 12/22 In 2019 was 40 to 34%  Stress test 1/23: no ischemia  Numb left arm, 03/2019 Left hands fingers not moving well going through folders Numbness got worse Left lip numb Lasted 45 min, then resolved without intervention No further episodes, etiology unclear  Previous MRI/MRA from 2019 reviewed Meningioma noted, chronic small vessel disease noted  05/2019 Carotid  u/s, results reviewed left ICA are consistent with a 40-59% stenosis. Right is open, s/p CEA  Echo 2019 - Left ventricle: The cavity size was normal. There was moderate    concentric hypertrophy. Systolic function was mildly to    moderately reduced. The estimated ejection fraction was in the    range of 40% to 45%.   Previous hearing problem,  led to MRI of the brain Discovery of mass right side of the brain Evaluated at [facility] found to have meningioma Had resection  04/21/2017 surgery resection     May 14 2011  he had acute onset of slurred speech. He was playing cards shortly after and he was unable to do any math. Symptoms lasted for approximately one to 2 hours. Blood pressure prior to this event had been well controlled though during and after the event, he had severe hypertension with systolic pressures sometimes greater than 200. He was in the emergency room for 2 days with extensive testing.   PMH:   has a past medical history of Allergic rhinitis due to pollen, CAD (coronary artery disease) (2002), Chicken pox, Diabetes ([place]), Diabetes mellitus, Dysplastic nevus (01/25/2008), Elevated prostate specific antigen (PSA) (02/21/2001), Headache(784.0), Hemiplegic migraine, without mention of intractable migraine without mention of status migrainosus, HLD (hyperlipidemia), HTN (hypertension) (05/08/2007), Hyperhidrosis, Impotence of organic origin (07/24/2007), Leg pain, Measles, Mumps, Nausea with vomiting, Neuropathy, Occlusion and stenosis of carotid artery without mention of cerebral infarction (09/15/2006), Peripheral  neuropathy (01/31/2008), Peripheral vascular disease ([place]), Personal history of urinary calculi, Pleural effusion (2007), Restless legs syndrome (RLS) (01/10/2007), Slurred speech, and TIA (transient ischemic attack).  PSH:    Past Surgical History:  Procedure Laterality Date   CAROTID ENDARTERECTOMY  2001   right   CATHETER REMOVAL Right 06/03/2019    CHOLECYSTECTOMY  2003   collapse lung  2007   CORONARY ARTERY BYPASS GRAFT  2001   KNEE SURGERY Left 1981   LUNG SURGERY  2007   Left; decortication left lung for persistant pleural effusion and PTX. admission ten days at [facility].   PAROTID GLAND TUMOR EXCISION  1973   VASECTOMY      Current Outpatient Medications  Medication Sig Dispense Refill   acetaminophen (TYLENOL) 500 MG tablet Take 500 mg by mouth every 6 (six) hours as needed. Patient states he can take 2 tablets daily as needed for pain     atorvastatin (LIPITOR) 40 MG tablet Take 1 tablet (40 mg total) by mouth daily. 90 tablet 3   cetirizine (ZYRTEC) 10 MG tablet Take 10 mg by mouth as needed.      FARXIGA 10 MG TABS tablet TAKE 1 TABLET BY MOUTH ONCE A DAY BEFOREBREAKFAST 30 tablet 3   fenofibrate 160 MG tablet Take 1 tablet by mouth daily.     finasteride (PROSCAR) 5 MG tablet Take 1 tablet (5 mg total) by mouth daily. 90 tablet 3   furosemide (LASIX) 40 MG tablet Take 1 tablet (40 mg total) by mouth 2 (two) times daily. 180 tablet 3   gabapentin (NEURONTIN) 300 MG capsule Take 300 mg by mouth 3 (three) times daily.     glucose blood test strip      ketoconazole (NIZORAL) 2 % shampoo Apply to scalp as directed. Leave in for 10 minutes before rinsing. 120 mL 2   losartan (COZAAR) 50 MG tablet Take 1 tablet (50 mg total) by mouth daily. 30 tablet 1   magnesium gluconate (MAGONATE) 500 MG tablet Take 500 mg by mouth daily.     metFORMIN (GLUCOPHAGE) 500 MG tablet Take 500 mg by mouth 2 (two) times daily with a meal.     metoCLOPramide (REGLAN) 10 MG tablet Take 1 tablet (10 mg total) by mouth every 8 (eight) hours as needed for nausea (headache). 15 tablet 0   metoprolol succinate (TOPROL-XL) 50 MG 24 hr tablet Take 0.5 tablets (25 mg total) by mouth daily. 45 tablet 3   Multiple Vitamin (MULTI-VITAMIN) tablet Take by mouth.     nitroGLYCERIN (NITROSTAT) 0.4 MG SL tablet Place 1 tablet (0.4 mg total) under the tongue every 5  (five) minutes as needed for chest pain. 25 tablet 3   potassium chloride SA (KLOR-CON M) 20 MEQ tablet Take 1.5 tablets (30 mEq total) by mouth daily. 135 tablet 3   promethazine (PHENERGAN) 12.5 MG tablet Take 1 tablet (12.5 mg total) by mouth every 8 (eight) hours as needed for nausea. 20 tablet 0   rivaroxaban (XARELTO) 20 MG TABS tablet Take 1 tablet (20 mg total) by mouth daily. 90 tablet 3   rOPINIRole (REQUIP) 0.5 MG tablet Take 0.5 mg by mouth at bedtime.     traMADol (ULTRAM) 50 MG tablet Take 1 tablet (50 mg total) by mouth every 6 (six) hours as needed. 15 tablet 0   triamcinolone lotion (KENALOG) 0.1 % Apply 1 application topically 2 (two) times daily.     pramipexole (MIRAPEX) 0.25 MG tablet Take 1 tablet (0.25 mg  total) by mouth 3 (three) times daily. (Patient not taking: Reported on 12/13/2021) 270 tablet 3   No current facility-administered medications for this visit.    Allergies:   Iodinated contrast media and Other   Social History:  The patient  reports that he has quit smoking. His smoking use included cigarettes. He has a 20.00 pack-year smoking history. He has never used smokeless tobacco. He reports that he does not drink alcohol and does not use drugs.   Family History:   family history includes Angina in his father; Anxiety disorder in his brother; Cancer in his father and another family member; Colon polyps in his sister; Coronary artery disease in an other family member; Emphysema in his father; Heart attack in his brother and mother; Heart disease in his maternal aunt; Heart failure in his father; Lymphoma in his sister.    Review of Systems: Review of Systems  Constitutional: Negative.   HENT: Negative.    Respiratory:  Positive for shortness of breath.   Cardiovascular:  Positive for leg swelling.  Gastrointestinal: Negative.   Musculoskeletal: Negative.   Neurological: Negative.   Psychiatric/Behavioral: Negative.    All other systems reviewed and are  negative.   PHYSICAL EXAM: VS:  BP (!) 148/70 (BP Location: Left Arm, Patient Position: Sitting, Cuff Size: Normal)   Pulse 61   Ht 5' 8.5" (1.74 m)   Wt 205 lb 6 oz (93.2 kg)   SpO2 95%   BMI 30.77 kg/m  , BMI Body mass index is 30.77 kg/m. Constitutional:  oriented to person, place, and time. No distress.  HENT:  Head: Grossly normal Eyes:  no discharge. No scleral icterus.  Neck: No JVD, no carotid bruits  Cardiovascular: Regular rate and rhythm, no murmurs appreciated Trace lower extremity edema Pulmonary/Chest: Clear to auscultation bilaterally, no wheezes or rails Abdominal: Soft.  no distension.  no tenderness.  Musculoskeletal: Normal range of motion Neurological:  normal muscle tone. Coordination normal. No atrophy Skin: Skin warm and dry Psychiatric: normal affect, pleasant  Recent Labs: 11/26/2021: BUN 21; Creatinine, Ser 1.30; Hemoglobin 13.7; Platelets 257; Potassium 4.1; Sodium 137    Lipid Panel Lab Results  Component Value Date   CHOL 147 06/06/2013   HDL 37.90 (L) 06/06/2013   LDLCALC 61 06/06/2013   TRIG 243.0 (H) 06/06/2013    Wt Readings from Last 3 Encounters:  12/13/21 205 lb 6 oz (93.2 kg)  11/26/21 194 lb (88 kg)  11/25/21 194 lb (88 kg)     ASSESSMENT AND PLAN:  Essential hypertension -  Blood pressure elevated, we will add spironolactone 25 mg daily  Chronic systolic and diastolic CHF Reports he is only taking Lasix 40 once a day not twice a day Has lower extremity edema Limited by incontinence Chronically depressed ejection fraction, echocardiogram  12/22 ejection fraction 35 to 40%, Myoview with no significant ischemia Farxiga 10 mg daily Add spionolactone 25 daily, decrease potassium down to 10 mill equivalents daily  Shortness of breath Likely multifactorial including obesity, deconditioning, fluid retention/pulmonary edema Recommend regular walking program, Lasix 40 twice daily  Carotid disease Stable, s/p CEA  History of  TIA, prior left arm numbness Prior history of TIAs chronic small vessel disease Managed with Xarelto   Hyperlipidemia Cholesterol close to goal, LDL little bit high  Diabetes type 2 with complications Diet discretion recommended, walking program, avoid fast food and minimize eating out A1c greater than 7 Continue Farxiga 10 mg daily Underlying nephropathy, CR 1.5   Total encounter  time more than 30 minutes  Greater than 50% was spent in counseling and coordination of care with the patient    Orders Placed This Encounter  Procedures   EKG 12-Lead       Signed, Esmond Plants, M.D., Ph.D. 12/13/2021  [place], [place]

## 2021-12-13 ENCOUNTER — Other Ambulatory Visit: Payer: Self-pay | Admitting: Cardiovascular Disease

## 2021-12-13 ENCOUNTER — Encounter: Payer: Self-pay | Admitting: Cardiovascular Disease

## 2021-12-13 ENCOUNTER — Ambulatory Visit: Payer: PPO | Attending: Cardiovascular Disease | Admitting: Cardiovascular Disease

## 2021-12-13 VITALS — BP 148/70 | HR 61 | Ht 68.5 in | Wt 205.4 lb

## 2021-12-13 DIAGNOSIS — I10 Essential (primary) hypertension: Secondary | ICD-10-CM | POA: Diagnosis not present

## 2021-12-13 DIAGNOSIS — N179 Acute kidney failure, unspecified: Secondary | ICD-10-CM | POA: Diagnosis not present

## 2021-12-13 DIAGNOSIS — E1143 Type 2 diabetes mellitus with diabetic autonomic (poly)neuropathy: Secondary | ICD-10-CM | POA: Diagnosis not present

## 2021-12-13 DIAGNOSIS — I428 Other cardiomyopathies: Secondary | ICD-10-CM

## 2021-12-13 DIAGNOSIS — I2510 Atherosclerotic heart disease of native coronary artery without angina pectoris: Secondary | ICD-10-CM

## 2021-12-13 DIAGNOSIS — I6523 Occlusion and stenosis of bilateral carotid arteries: Secondary | ICD-10-CM

## 2021-12-13 DIAGNOSIS — I5032 Chronic diastolic (congestive) heart failure: Secondary | ICD-10-CM | POA: Diagnosis not present

## 2021-12-13 DIAGNOSIS — I739 Peripheral vascular disease, unspecified: Secondary | ICD-10-CM

## 2021-12-13 MED ORDER — POTASSIUM CHLORIDE CRYS ER 10 MEQ PO TBCR: 10.0000 meq | EXTENDED_RELEASE_TABLET | Freq: Every day | ORAL | 3 refills | Status: DC

## 2021-12-13 MED ORDER — SPIRONOLACTONE 25 MG PO TABS: 25.0000 mg | ORAL_TABLET | Freq: Every day | ORAL | 3 refills | Status: DC

## 2021-12-13 NOTE — Patient Instructions (Addendum)
Medication Instructions:  Please start spironolactone 25 mg daily Decrease potassium down to 10 meq daily  If you need a refill on your cardiac medications before your next appointment, please call your pharmacy.   Lab work: No new labs needed  Testing/Procedures: No new testing needed  Follow-Up: At [facility], you and your health needs are our priority.  As part of our continuing mission to provide you with exceptional heart care, we have created designated Provider Care Teams.  These Care Teams include your primary Cardiologist (physician) and Advanced Practice Providers (APPs -  Physician Assistants and Nurse Practitioners) who all work together to provide you with the care you need, when you need it.  You will need a follow up appointment in 6 months, APP ok  Providers on your designated Care Team:   Murray Hodgkins, NP Christell Faith, PA-C Cad[place] Kathlen Mody, [place]  COVID-19 Vaccine Information can be found at: [email or web address] For questions related to vaccine distribution or appointments, please email [email or web address] or call [phone].

## 2022-01-17 DIAGNOSIS — N1831 Chronic kidney disease, stage 3a: Secondary | ICD-10-CM | POA: Diagnosis not present

## 2022-01-17 DIAGNOSIS — E782 Mixed hyperlipidemia: Secondary | ICD-10-CM | POA: Diagnosis not present

## 2022-01-17 DIAGNOSIS — E1143 Type 2 diabetes mellitus with diabetic autonomic (poly)neuropathy: Secondary | ICD-10-CM | POA: Diagnosis not present

## 2022-01-17 DIAGNOSIS — Z862 Personal history of diseases of the blood and blood-forming organs and certain disorders involving the immune mechanism: Secondary | ICD-10-CM | POA: Diagnosis not present

## 2022-01-21 DIAGNOSIS — N184 Chronic kidney disease, stage 4 (severe): Secondary | ICD-10-CM | POA: Diagnosis not present

## 2022-01-21 DIAGNOSIS — E1143 Type 2 diabetes mellitus with diabetic autonomic (poly)neuropathy: Secondary | ICD-10-CM | POA: Diagnosis not present

## 2022-01-21 DIAGNOSIS — I10 Essential (primary) hypertension: Secondary | ICD-10-CM | POA: Diagnosis not present

## 2022-01-21 DIAGNOSIS — Z862 Personal history of diseases of the blood and blood-forming organs and certain disorders involving the immune mechanism: Secondary | ICD-10-CM | POA: Diagnosis not present

## 2022-01-21 DIAGNOSIS — E782 Mixed hyperlipidemia: Secondary | ICD-10-CM | POA: Diagnosis not present

## 2022-02-02 ENCOUNTER — Other Ambulatory Visit: Payer: Self-pay | Admitting: Physician Assistant

## 2022-02-02 DIAGNOSIS — D329 Benign neoplasm of meninges, unspecified: Secondary | ICD-10-CM

## 2022-02-10 ENCOUNTER — Ambulatory Visit
Admission: RE | Admit: 2022-02-10 | Discharge: 2022-02-10 | Disposition: A | Discharge status: Home / Self Care | Payer: PPO | Source: Ambulatory Visit | Attending: Physician Assistant | Admitting: Physician Assistant

## 2022-02-10 DIAGNOSIS — G939 Disorder of brain, unspecified: Secondary | ICD-10-CM | POA: Diagnosis not present

## 2022-02-10 DIAGNOSIS — I6782 Cerebral ischemia: Secondary | ICD-10-CM | POA: Diagnosis not present

## 2022-02-10 DIAGNOSIS — D329 Benign neoplasm of meninges, unspecified: Secondary | ICD-10-CM | POA: Diagnosis not present

## 2022-02-10 DIAGNOSIS — G319 Degenerative disease of nervous system, unspecified: Secondary | ICD-10-CM | POA: Diagnosis not present

## 2022-02-10 MED ORDER — GADOBUTROL 1 MMOL/ML IV SOLN
10.0000 mL | Freq: Once | INTRAVENOUS | Status: AC | PRN
  Administered 2022-02-10: 10 mL via INTRAVENOUS

## 2022-02-15 DIAGNOSIS — D329 Benign neoplasm of meninges, unspecified: Secondary | ICD-10-CM | POA: Diagnosis not present

## 2022-02-22 DIAGNOSIS — E119 Type 2 diabetes mellitus without complications: Secondary | ICD-10-CM | POA: Diagnosis not present

## 2022-04-07 ENCOUNTER — Encounter: Payer: Self-pay | Admitting: Urology

## 2022-04-07 ENCOUNTER — Ambulatory Visit (INDEPENDENT_AMBULATORY_CARE_PROVIDER_SITE_OTHER): Payer: HMO | Admitting: Urology

## 2022-04-07 VITALS — BP 143/70 | HR 69 | Ht 68.5 in | Wt 200.0 lb

## 2022-04-07 DIAGNOSIS — R339 Retention of urine, unspecified: Secondary | ICD-10-CM

## 2022-04-07 DIAGNOSIS — N184 Chronic kidney disease, stage 4 (severe): Secondary | ICD-10-CM

## 2022-04-07 DIAGNOSIS — N401 Enlarged prostate with lower urinary tract symptoms: Secondary | ICD-10-CM

## 2022-04-07 DIAGNOSIS — R350 Frequency of micturition: Secondary | ICD-10-CM | POA: Diagnosis not present

## 2022-04-07 LAB — BLADDER SCAN AMB NON-IMAGING: SCA Result: 256

## 2022-04-07 MED ORDER — SILODOSIN 8 MG PO CAPS: 8.0000 mg | ORAL_CAPSULE | Freq: Every day | ORAL | 1 refills | Status: DC

## 2022-04-07 NOTE — Progress Notes (Signed)
04/07/2022 1:27 PM   Kevin Choi 11-22-1940 [id number]  Referring provider: [name], MD [facility] [place],   [id number]  Chief Complaint  Patient presents with   Follow-up    Urologic history:   1.  BPH with lower urinary tract symptoms             -On finasteride             -Mild to moderate residual   2.  History elevated PSA             -History of several biopsies between 2003-2010             -No records available; all benign per patient             -Baseline uncorrected PSA upper 1 range             -Prostate cancer screening discontinued  HPI: 82 y.o. male presents for annual follow-up  States he was recently diagnosed with CKD stage IV and has a nephrology appointment later this month Voiding pattern is stable Remains on finasteride Denies dysuria, gross hematuria Denies flank, abdominal or pelvic pain  PMH: Past Medical History:  Diagnosis Date   Allergic rhinitis due to pollen    CAD (coronary artery disease) 2002   bypass   Chicken pox    Diabetes ([place])    Diabetes mellitus    Dysplastic nevus 01/25/2008   Right mid back. Moderate atypia, close to margin.   Elevated prostate specific antigen (PSA) 02/21/2001   Headache(784.0)    Hemiplegic migraine, without mention of intractable migraine without mention of status migrainosus    HLD (hyperlipidemia)    HTN (hypertension) 05/08/2007   Hyperhidrosis    Impotence of organic origin 07/24/2007   Leg pain    Measles    Mumps    Nausea with vomiting    Neuropathy    Occlusion and stenosis of carotid artery without mention of cerebral infarction 09/15/2006   Peripheral neuropathy 01/31/2008   Peripheral vascular disease ([place])    Personal history of urinary calculi    Pleural effusion 2007   with pneumothorax    Restless legs syndrome (RLS) 01/10/2007   Slurred speech    TIA (transient ischemic attack)    Patient stated 2 months ago     Surgical History: Past Surgical History:  Procedure Laterality Date   CAROTID ENDARTERECTOMY  2001   right   CATHETER REMOVAL Right 06/03/2019   CHOLECYSTECTOMY  2003   collapse lung  2007   CORONARY ARTERY BYPASS GRAFT  2001   KNEE SURGERY Left 1981   LUNG SURGERY  2007   Left; decortication left lung for persistant pleural effusion and PTX. admission ten days at [facility] [place].   PAROTID GLAND TUMOR EXCISION  1973   VASECTOMY      Home Medications:  Allergies as of 04/07/2022       Reactions   Iodinated Contrast Media Nausea Only   Hypotension Hypotension   Other Nausea Only, Other (See Comments)   Contrast Dye-drops blood pressure. Contrast Dye- hypotension and nausa and syncope        Medication List        Accurate as of April 07, 2022  1:27 PM. If you have any questions, ask your nurse or doctor.          STOP taking these medications    metFORMIN 500 MG tablet Commonly known as: GLUCOPHAGE  pramipexole 0.25 MG tablet Commonly known as: Mirapex   traMADol 50 MG tablet Commonly known as: Ultram       TAKE these medications    acetaminophen 500 MG tablet Commonly known as: TYLENOL Take 500 mg by mouth every 6 (six) hours as needed. Patient states he can take 2 tablets daily as needed for pain   atorvastatin 40 MG tablet Commonly known as: LIPITOR Take 1 tablet (40 mg total) by mouth daily.   cetirizine 10 MG tablet Commonly known as: ZYRTEC Take 10 mg by mouth as needed.   Farxiga 10 MG Tabs tablet Generic drug: dapagliflozin propanediol TAKE 1 TABLET BY MOUTH ONCE A DAY BEFOREBREAKFAST   fenofibrate 160 MG tablet Take 1 tablet by mouth daily.   finasteride 5 MG tablet Commonly known as: PROSCAR Take 1 tablet (5 mg total) by mouth daily.   furosemide 40 MG tablet Commonly known as: LASIX Take 1 tablet (40 mg total) by mouth 2 (two) times daily.   gabapentin 300 MG capsule Commonly known as: NEURONTIN Take 300 mg by mouth 3  (three) times daily.   gabapentin 300 MG capsule Commonly known as: NEURONTIN Take 1 capsule by mouth 3 (three) times daily.   glucose blood test strip   ketoconazole 2 % shampoo Commonly known as: NIZORAL Apply to scalp as directed. Leave in for 10 minutes before rinsing.   losartan 50 MG tablet Commonly known as: COZAAR TAKE 1 TABLET BY MOUTH DAILY   magnesium gluconate 500 MG tablet Commonly known as: MAGONATE Take 500 mg by mouth daily.   metoCLOPramide 10 MG tablet Commonly known as: REGLAN Take 1 tablet (10 mg total) by mouth every 8 (eight) hours as needed for nausea (headache).   metoprolol succinate 50 MG 24 hr tablet Commonly known as: TOPROL-XL Take 0.5 tablets (25 mg total) by mouth daily.   Multi-Vitamin tablet Take by mouth.   nitroGLYCERIN 0.4 MG SL tablet Commonly known as: Nitrostat Place 1 tablet (0.4 mg total) under the tongue every 5 (five) minutes as needed for chest pain.   potassium chloride 10 MEQ tablet Commonly known as: KLOR-CON M Take 1 tablet (10 mEq total) by mouth daily.   promethazine 12.5 MG tablet Commonly known as: PHENERGAN Take 1 tablet (12.5 mg total) by mouth every 8 (eight) hours as needed for nausea.   rivaroxaban 20 MG Tabs tablet Commonly known as: Xarelto Take 1 tablet (20 mg total) by mouth daily.   rOPINIRole 0.5 MG tablet Commonly known as: REQUIP Take 0.5 mg by mouth at bedtime.   spironolactone 25 MG tablet Commonly known as: ALDACTONE Take 1 tablet (25 mg total) by mouth daily.   triamcinolone lotion 0.1 % Commonly known as: KENALOG Apply 1 application topically 2 (two) times daily.   triamcinolone lotion 0.1 % Commonly known as: KENALOG Apply topically.        Allergies:  Allergies  Allergen Reactions   Iodinated Contrast Media Nausea Only    Hypotension Hypotension   Other Nausea Only and Other (See Comments)    Contrast Dye-drops blood pressure. Contrast Dye- hypotension and nausa and  syncope    Family History: Family History  Problem Relation Age of Onset   Heart attack Mother    Heart failure Father    Angina Father    Emphysema Father    Cancer Father        prostate   Lymphoma Sister        Non-Hodgkins   Cancer Other  Coronary artery disease Other        brother   Heart attack Brother    Heart disease Maternal Aunt    Colon polyps Sister    Anxiety disorder Brother     Social History:  reports that he has quit smoking. His smoking use included cigarettes. He has a 20.00 pack-year smoking history. He has never used smokeless tobacco. He reports that he does not drink alcohol and does not use drugs.   Physical Exam: BP (!) 143/70   Pulse 69   Ht 5' 8.5" (1.74 m)   Wt 200 lb (90.7 kg)   BMI 29.97 kg/m   Constitutional:  Alert and oriented, No acute distress. HEENT: [place] AT, moist mucus membranes.  Trachea midline, no masses. Cardiovascular: No clubbing, cyanosis, or edema. Respiratory: Normal respiratory effort, no increased work of breathing. Psychiatric: Normal mood and affect.   Assessment & Plan:    1. Benign prostatic hyperplasia with urinary frequency Stable Finasteride refilled  2.  Incomplete bladder emptying Worsening PVR today 256 mL With recent diagnosis of stage IV CKD will schedule renal ultrasound At silodosin 8 mg daily; follow-up PVR 1 month   Abbie Sons, MD  [facility] [address], [place], [place] [address] [phone]

## 2022-04-11 ENCOUNTER — Ambulatory Visit
Admission: RE | Admit: 2022-04-11 | Discharge: 2022-04-11 | Disposition: A | Discharge status: Home / Self Care | Payer: HMO | Source: Ambulatory Visit | Attending: Urology | Admitting: Urology

## 2022-04-11 DIAGNOSIS — N184 Chronic kidney disease, stage 4 (severe): Secondary | ICD-10-CM | POA: Diagnosis not present

## 2022-04-11 DIAGNOSIS — N400 Enlarged prostate without lower urinary tract symptoms: Secondary | ICD-10-CM | POA: Diagnosis not present

## 2022-04-11 DIAGNOSIS — N281 Cyst of kidney, acquired: Secondary | ICD-10-CM | POA: Diagnosis not present

## 2022-04-11 DIAGNOSIS — N2889 Other specified disorders of kidney and ureter: Secondary | ICD-10-CM | POA: Diagnosis not present

## 2022-04-11 DIAGNOSIS — N189 Chronic kidney disease, unspecified: Secondary | ICD-10-CM | POA: Diagnosis not present

## 2022-04-15 ENCOUNTER — Telehealth: Payer: Self-pay | Admitting: Urology

## 2022-04-15 DIAGNOSIS — N281 Cyst of kidney, acquired: Secondary | ICD-10-CM

## 2022-04-15 NOTE — Telephone Encounter (Signed)
Renal ultrasound shows probable small renal cysts.  Radiology recommended a follow-up renal ultrasound in 6 months.  Order placed and radiology will call to schedule

## 2022-04-15 NOTE — Telephone Encounter (Signed)
Patient notified and voiced understanding.

## 2022-04-20 DIAGNOSIS — N179 Acute kidney failure, unspecified: Secondary | ICD-10-CM | POA: Diagnosis not present

## 2022-04-20 DIAGNOSIS — N2889 Other specified disorders of kidney and ureter: Secondary | ICD-10-CM | POA: Insufficient documentation

## 2022-04-20 DIAGNOSIS — E1122 Type 2 diabetes mellitus with diabetic chronic kidney disease: Secondary | ICD-10-CM | POA: Insufficient documentation

## 2022-04-20 DIAGNOSIS — I129 Hypertensive chronic kidney disease with stage 1 through stage 4 chronic kidney disease, or unspecified chronic kidney disease: Secondary | ICD-10-CM | POA: Diagnosis not present

## 2022-04-20 DIAGNOSIS — N189 Chronic kidney disease, unspecified: Secondary | ICD-10-CM | POA: Diagnosis not present

## 2022-04-20 DIAGNOSIS — N1831 Chronic kidney disease, stage 3a: Secondary | ICD-10-CM | POA: Diagnosis not present

## 2022-04-21 ENCOUNTER — Other Ambulatory Visit: Payer: Self-pay

## 2022-04-21 DIAGNOSIS — Z862 Personal history of diseases of the blood and blood-forming organs and certain disorders involving the immune mechanism: Secondary | ICD-10-CM | POA: Diagnosis not present

## 2022-04-21 DIAGNOSIS — E782 Mixed hyperlipidemia: Secondary | ICD-10-CM | POA: Diagnosis not present

## 2022-04-21 DIAGNOSIS — E1143 Type 2 diabetes mellitus with diabetic autonomic (poly)neuropathy: Secondary | ICD-10-CM | POA: Diagnosis not present

## 2022-04-21 MED ORDER — LOSARTAN POTASSIUM 50 MG PO TABS: 50.0000 mg | ORAL_TABLET | Freq: Every day | ORAL | 1 refills | Status: DC

## 2022-04-27 DIAGNOSIS — R600 Localized edema: Secondary | ICD-10-CM | POA: Diagnosis not present

## 2022-04-27 DIAGNOSIS — E1122 Type 2 diabetes mellitus with diabetic chronic kidney disease: Secondary | ICD-10-CM | POA: Diagnosis not present

## 2022-04-27 DIAGNOSIS — N189 Chronic kidney disease, unspecified: Secondary | ICD-10-CM | POA: Diagnosis not present

## 2022-04-27 DIAGNOSIS — I129 Hypertensive chronic kidney disease with stage 1 through stage 4 chronic kidney disease, or unspecified chronic kidney disease: Secondary | ICD-10-CM | POA: Diagnosis not present

## 2022-04-27 DIAGNOSIS — N2889 Other specified disorders of kidney and ureter: Secondary | ICD-10-CM | POA: Diagnosis not present

## 2022-04-27 DIAGNOSIS — N1831 Chronic kidney disease, stage 3a: Secondary | ICD-10-CM | POA: Diagnosis not present

## 2022-04-28 DIAGNOSIS — E782 Mixed hyperlipidemia: Secondary | ICD-10-CM | POA: Diagnosis not present

## 2022-04-28 DIAGNOSIS — I10 Essential (primary) hypertension: Secondary | ICD-10-CM | POA: Diagnosis not present

## 2022-04-28 DIAGNOSIS — Z862 Personal history of diseases of the blood and blood-forming organs and certain disorders involving the immune mechanism: Secondary | ICD-10-CM | POA: Diagnosis not present

## 2022-04-28 DIAGNOSIS — I739 Peripheral vascular disease, unspecified: Secondary | ICD-10-CM | POA: Diagnosis not present

## 2022-04-28 DIAGNOSIS — N1831 Chronic kidney disease, stage 3a: Secondary | ICD-10-CM | POA: Diagnosis not present

## 2022-04-28 DIAGNOSIS — D329 Benign neoplasm of meninges, unspecified: Secondary | ICD-10-CM | POA: Diagnosis not present

## 2022-04-28 DIAGNOSIS — I25118 Atherosclerotic heart disease of native coronary artery with other forms of angina pectoris: Secondary | ICD-10-CM | POA: Diagnosis not present

## 2022-04-28 DIAGNOSIS — I5022 Chronic systolic (congestive) heart failure: Secondary | ICD-10-CM | POA: Diagnosis not present

## 2022-04-28 DIAGNOSIS — E1143 Type 2 diabetes mellitus with diabetic autonomic (poly)neuropathy: Secondary | ICD-10-CM | POA: Diagnosis not present

## 2022-05-11 ENCOUNTER — Ambulatory Visit (INDEPENDENT_AMBULATORY_CARE_PROVIDER_SITE_OTHER): Payer: HMO | Admitting: Urology

## 2022-05-11 ENCOUNTER — Encounter: Payer: Self-pay | Admitting: Urology

## 2022-05-11 ENCOUNTER — Telehealth: Payer: Self-pay | Admitting: Cardiovascular Disease

## 2022-05-11 VITALS — BP 125/67 | HR 69 | Ht 68.0 in | Wt 205.0 lb

## 2022-05-11 DIAGNOSIS — N2889 Other specified disorders of kidney and ureter: Secondary | ICD-10-CM

## 2022-05-11 DIAGNOSIS — R339 Retention of urine, unspecified: Secondary | ICD-10-CM

## 2022-05-11 DIAGNOSIS — N401 Enlarged prostate with lower urinary tract symptoms: Secondary | ICD-10-CM | POA: Diagnosis not present

## 2022-05-11 DIAGNOSIS — N281 Cyst of kidney, acquired: Secondary | ICD-10-CM

## 2022-05-11 LAB — BLADDER SCAN AMB NON-IMAGING

## 2022-05-11 MED ORDER — SILODOSIN 8 MG PO CAPS: 8.0000 mg | ORAL_CAPSULE | Freq: Every day | ORAL | 11 refills | Status: DC

## 2022-05-11 NOTE — Telephone Encounter (Signed)
Form placed on provider desk.

## 2022-05-11 NOTE — Progress Notes (Signed)
04/07/2022 1:56 PM   Kevin Choi 12/30/1940 [id number]  Referring provider: [name], MD [facility] [place],  [place] [address]  Chief Complaint  Patient presents with   Other    PVR    Urologic history:   1.  BPH with lower urinary tract symptoms             -On finasteride and Silodosin             -Mild to moderate residual   2.  History elevated PSA             -History of several biopsies between 2003-2010             -No records available; all benign per patient             -Baseline uncorrected PSA upper 1 range             -Prostate cancer screening discontinued  HPI: 82 y.o. male presents for 1 month follow-up  Recent nephrology appointment revealed a progression to stage 3a chronic kidney disease, with blood work showing an increase in GFR from 29 to 46 Less incontinence and better urinary stream since starting Silodosin Remains on Finasteride Denies dysuria, gross hematuria Denies flank, abdominal or pelvic pain Renal ultrasound on 04/11/22 showed no hydronephrosis. There was a small right renal mass and follow-up ultrasound was recommended which is scheduled August 2024.  PMH: Past Medical History:  Diagnosis Date   Allergic rhinitis due to pollen    CAD (coronary artery disease) 2002   bypass   Chicken pox    Diabetes ([place])    Diabetes mellitus    Dysplastic nevus 01/25/2008   Right mid back. Moderate atypia, close to margin.   Elevated prostate specific antigen (PSA) 02/21/2001   Headache(784.0)    Hemiplegic migraine, without mention of intractable migraine without mention of status migrainosus    HLD (hyperlipidemia)    HTN (hypertension) 05/08/2007   Hyperhidrosis    Impotence of organic origin 07/24/2007   Leg pain    Measles    Mumps    Nausea with vomiting    Neuropathy    Occlusion and stenosis of carotid artery without mention of cerebral infarction 09/15/2006   Peripheral neuropathy  01/31/2008   Peripheral vascular disease ([place])    Personal history of urinary calculi    Pleural effusion 2007   with pneumothorax    Restless legs syndrome (RLS) 01/10/2007   Slurred speech    TIA (transient ischemic attack)    Patient stated 2 months ago    Surgical History: Past Surgical History:  Procedure Laterality Date   CAROTID ENDARTERECTOMY  2001   right   CATHETER REMOVAL Right 06/03/2019   CHOLECYSTECTOMY  2003   collapse lung  2007   CORONARY ARTERY BYPASS GRAFT  2001   KNEE SURGERY Left 1981   LUNG SURGERY  2007   Left; decortication left lung for persistant pleural effusion and PTX. admission ten days at [facility]   PAROTID GLAND TUMOR EXCISION  1973   VASECTOMY      Home Medications:  Allergies as of 05/11/2022       Reactions   Iodinated Contrast Media Nausea Only   Hypotension Hypotension   Other Nausea Only, Other (See Comments)   Contrast Dye-drops blood pressure. Contrast Dye- hypotension and nausa and syncope        Medication List        Accurate  as of May 11, 2022  1:56 PM. If you have any questions, ask your nurse or doctor.          STOP taking these medications    Farxiga 10 MG Tabs tablet Generic drug: dapagliflozin propanediol Stopped by: Abbie Sons, MD   promethazine 12.5 MG tablet Commonly known as: PHENERGAN Stopped by: Abbie Sons, MD       TAKE these medications    acetaminophen 500 MG tablet Commonly known as: TYLENOL Take 500 mg by mouth every 6 (six) hours as needed. Patient states he can take 2 tablets daily as needed for pain   atorvastatin 40 MG tablet Commonly known as: LIPITOR Take 1 tablet (40 mg total) by mouth daily.   cetirizine 10 MG tablet Commonly known as: ZYRTEC Take 10 mg by mouth as needed.   fenofibrate 160 MG tablet Take 1 tablet by mouth daily.   finasteride 5 MG tablet Commonly known as: PROSCAR Take 1 tablet (5 mg total) by mouth daily.   furosemide 40 MG  tablet Commonly known as: LASIX Take 1 tablet (40 mg total) by mouth 2 (two) times daily.   gabapentin 300 MG capsule Commonly known as: NEURONTIN Take 300 mg by mouth 3 (three) times daily. What changed: Another medication with the same name was removed. Continue taking this medication, and follow the directions you see here. Changed by: Abbie Sons, MD   glucose blood test strip   ketoconazole 2 % shampoo Commonly known as: NIZORAL Apply to scalp as directed. Leave in for 10 minutes before rinsing.   losartan 50 MG tablet Commonly known as: COZAAR Take 1 tablet (50 mg total) by mouth daily.   magnesium gluconate 500 MG tablet Commonly known as: MAGONATE Take 500 mg by mouth daily.   metoCLOPramide 10 MG tablet Commonly known as: REGLAN Take 1 tablet (10 mg total) by mouth every 8 (eight) hours as needed for nausea (headache).   metoprolol succinate 50 MG 24 hr tablet Commonly known as: TOPROL-XL Take 0.5 tablets (25 mg total) by mouth daily.   Multi-Vitamin tablet Take by mouth.   nitroGLYCERIN 0.4 MG SL tablet Commonly known as: Nitrostat Place 1 tablet (0.4 mg total) under the tongue every 5 (five) minutes as needed for chest pain.   potassium chloride 10 MEQ tablet Commonly known as: KLOR-CON M Take 1 tablet (10 mEq total) by mouth daily.   rivaroxaban 20 MG Tabs tablet Commonly known as: Xarelto Take 1 tablet (20 mg total) by mouth daily.   rOPINIRole 0.5 MG tablet Commonly known as: REQUIP Take 0.5 mg by mouth at bedtime.   silodosin 8 MG Caps capsule Commonly known as: RAPAFLO Take 1 capsule (8 mg total) by mouth daily with breakfast.   spironolactone 25 MG tablet Commonly known as: ALDACTONE Take 1 tablet (25 mg total) by mouth daily.   triamcinolone lotion 0.1 % Commonly known as: KENALOG Apply 1 application topically 2 (two) times daily.   triamcinolone lotion 0.1 % Commonly known as: KENALOG Apply topically.        Allergies:   Allergies  Allergen Reactions   Iodinated Contrast Media Nausea Only    Hypotension Hypotension   Other Nausea Only and Other (See Comments)    Contrast Dye-drops blood pressure. Contrast Dye- hypotension and nausa and syncope    Family History: Family History  Problem Relation Age of Onset   Heart attack Mother    Heart failure Father    Angina Father  Emphysema Father    Cancer Father        prostate   Lymphoma Sister        Non-Hodgkins   Cancer Other    Coronary artery disease Other        brother   Heart attack Brother    Heart disease Maternal Aunt    Colon polyps Sister    Anxiety disorder Brother     Social History:  reports that he has quit smoking. His smoking use included cigarettes. He has a 20.00 pack-year smoking history. He has never used smokeless tobacco. He reports that he does not drink alcohol and does not use drugs.   Physical Exam: BP 125/67   Pulse 69   Ht 5\' 8"  (1.727 m)   Wt 205 lb (93 kg)   BMI 31.17 kg/m   Constitutional:  Alert and oriented, No acute distress. HEENT: [place] AT, moist mucus membranes.  Trachea midline, no masses. Cardiovascular: No clubbing, cyanosis, or edema. Respiratory: Normal respiratory effort, no increased work of breathing. Psychiatric: Normal mood and affect.  Pertinent Imaging: Renal ultrasound was personally reviewed and interpreted  EXAM: RENAL / URINARY TRACT ULTRASOUND COMPLETE   COMPARISON:  None Available.   FINDINGS: Right Kidney:   Renal measurements: 10.4 x 5.3 x 4.8 cm = volume: 139 mL. Contains a 11 mm hypoechoic mass.   Left Kidney:   Renal measurements: 11.6 x 5.5 x 5.4 cm = volume: 180 mL. Contains an 11 mm hypoechoic mass in the upper pole. Contains a simple cyst in the midpole. No follow-up imaging recommended for the cyst.   Bladder:   Appears normal for degree of bladder distention.   Other:   The prostate volume is 40 cc.   IMPRESSION: 1. There is an 11 mm hypoechoic  mass in each kidney. A complicated cyst, too small to characterize, is favored. Recommend a follow-up ultrasound in 6 months to ensure stability. 2. Enlarged prostate with a volume of 40 cc. 3. No other abnormalities.     Electronically Signed   By: Dorise Bullion III M.D.   On: 04/11/2022 20:06   Assessment & Plan:    1. Benign prostatic hyperplasia with incomplete bladder emptying Symptoms have improved on Silodosin PVR today moderately elevated at 222 mL; improved from prior visit Continue Finasteride Follow-up in 6 months with PVR  2. Renal mass  Probable small complex cyst Repeat renal ultrasound is scheduled 09/2022   [facility] [address], [place], [place] [address] [phone]

## 2022-05-11 NOTE — Telephone Encounter (Signed)
Patient dropped off a disability parking placard to be filled out, placed in box.

## 2022-05-14 ENCOUNTER — Other Ambulatory Visit: Payer: Self-pay | Admitting: Urology

## 2022-05-20 ENCOUNTER — Other Ambulatory Visit: Payer: Self-pay | Admitting: Cardiovascular Disease

## 2022-06-02 NOTE — Telephone Encounter (Signed)
Spoke with patient and advised that his form has been completed and ready for him to pick up. He verbalized understanding with no further questions at this time.

## 2022-06-12 NOTE — Progress Notes (Deleted)
Cardiology Office Note  Date:  06/12/2022   ID:  Kevin Choi, DOB Nov 30, 1940, MRN [id number]  PCP:  Marisue Ivan, MD   No chief complaint on file.    HPI:  82 year old gentleman with a history of  coronary artery disease,  bypass surgery with a LIMA to the LAD in 2002,  vein graft to the diagonal,  diabetes,  hypertension,  hyperlipidemia,  pleural effusion requiring a VATS,  carotid endarterectomy on the right in 2001,  TIA symptoms 3 times, once in 2002, October 2012 in March 2013. Status post bilateral total knee replacements 40% left carotid arterial disease resection of right frontal meningioma. On xarelto for TIAs EF 40 to 45% in 2019, down to 35 to 40% in 2022 Myoview January 2023, no significant ischemia who presents for routine followup of his coronary artery disease ,  leg swelling and  shortness of breath.  LOV October 2023  11/2021 in ER with migraine MR brain shows no acute findings small meningioma or schwannoma that has increased 2 mm in the last 4 years Given reglan  Taking lasix 40 in the PM Skipping the AM, Mild leg edema Incontinence, uses a pad  Denies chest pain concerning for angina No regular exercise program  Difficulty affording Farxiga and Xarelto  Labs  A1c 7.4 Total chol 144, LDL 73 CR 1.3 to 1.5  EKG personally reviewed by myself on todays visit Normal sinus rhythm with left bundle branch block rate 61 bpm  Past medical history reviewed Echo,  EF 35 to 40% on 12/22 In 2019 was 40 to 34%  Stress test 1/23: no ischemia  Numb left arm, 03/2019 Left hands fingers not moving well going through folders Numbness got worse Left lip numb Lasted 45 min, then resolved without intervention No further episodes, etiology unclear  Previous MRI/MRA from 2019 reviewed Meningioma noted, chronic small vessel disease noted  05/2019 Carotid u/s, results reviewed left ICA are consistent with a 40-59% stenosis. Right is open, s/p  CEA  Echo 2019 - Left ventricle: The cavity size was normal. There was moderate    concentric hypertrophy. Systolic function was mildly to    moderately reduced. The estimated ejection fraction was in the    range of 40% to 45%.   Previous hearing problem,  led to MRI of the brain Discovery of mass right side of the brain Evaluated at [facility] found to have meningioma Had resection  04/21/2017 surgery resection     May 14 2011  he had acute onset of slurred speech. He was playing cards shortly after and he was unable to do any math. Symptoms lasted for approximately one to 2 hours. Blood pressure prior to this event had been well controlled though during and after the event, he had severe hypertension with systolic pressures sometimes greater than 200. He was in the emergency room for 2 days with extensive testing.   PMH:   has a past medical history of Allergic rhinitis due to pollen, CAD (coronary artery disease) (2002), Chicken pox, Diabetes (HCC), Diabetes mellitus, Dysplastic nevus (01/25/2008), Elevated prostate specific antigen (PSA) (02/21/2001), Headache(784.0), Hemiplegic migraine, without mention of intractable migraine without mention of status migrainosus, HLD (hyperlipidemia), HTN (hypertension) (05/08/2007), Hyperhidrosis, Impotence of organic origin (07/24/2007), Leg pain, Measles, Mumps, Nausea with vomiting, Neuropathy, Occlusion and stenosis of carotid artery without mention of cerebral infarction (09/15/2006), Peripheral neuropathy (01/31/2008), Peripheral vascular disease (HCC), Personal history of urinary calculi, Pleural effusion (2007), Restless legs syndrome (RLS) (01/10/2007), Slurred speech,  and TIA (transient ischemic attack).  PSH:    Past Surgical History:  Procedure Laterality Date   CAROTID ENDARTERECTOMY  2001   right   CATHETER REMOVAL Right 06/03/2019   CHOLECYSTECTOMY  2003   collapse lung  2007   CORONARY ARTERY BYPASS GRAFT  2001   KNEE SURGERY  Left 1981   LUNG SURGERY  2007   Left; decortication left lung for persistant pleural effusion and PTX. admission ten days at [facility].   PAROTID GLAND TUMOR EXCISION  1973   VASECTOMY      Current Outpatient Medications  Medication Sig Dispense Refill   acetaminophen (TYLENOL) 500 MG tablet Take 500 mg by mouth every 6 (six) hours as needed. Patient states he can take 2 tablets daily as needed for pain     atorvastatin (LIPITOR) 40 MG tablet TAKE ONE TABLET BY MOUTH ONCE A DAY 90 tablet 3   cetirizine (ZYRTEC) 10 MG tablet Take 10 mg by mouth as needed.      fenofibrate 160 MG tablet Take 1 tablet by mouth daily.     finasteride (PROSCAR) 5 MG tablet Take 1 tablet (5 mg total) by mouth daily. 90 tablet 3   furosemide (LASIX) 40 MG tablet TAKE ONE TABLET BY MOUTH TWICE A DAY 180 tablet 3   gabapentin (NEURONTIN) 300 MG capsule Take 300 mg by mouth 3 (three) times daily.     glucose blood test strip      ketoconazole (NIZORAL) 2 % shampoo Apply to scalp as directed. Leave in for 10 minutes before rinsing. 120 mL 2   losartan (COZAAR) 50 MG tablet Take 1 tablet (50 mg total) by mouth daily. 30 tablet 1   magnesium gluconate (MAGONATE) 500 MG tablet Take 500 mg by mouth daily.     metoCLOPramide (REGLAN) 10 MG tablet Take 1 tablet (10 mg total) by mouth every 8 (eight) hours as needed for nausea (headache). 15 tablet 0   metoprolol succinate (TOPROL-XL) 50 MG 24 hr tablet Take 0.5 tablets (25 mg total) by mouth daily. 45 tablet 3   Multiple Vitamin (MULTI-VITAMIN) tablet Take by mouth.     nitroGLYCERIN (NITROSTAT) 0.4 MG SL tablet Place 1 tablet (0.4 mg total) under the tongue every 5 (five) minutes as needed for chest pain. 25 tablet 3   potassium chloride SA (KLOR-CON M) 10 MEQ tablet Take 1 tablet (10 mEq total) by mouth daily. 90 tablet 3   rivaroxaban (XARELTO) 20 MG TABS tablet Take 1 tablet (20 mg total) by mouth daily. 90 tablet 3   rOPINIRole (REQUIP) 0.5 MG tablet Take 0.5 mg by mouth  at bedtime.     silodosin (RAPAFLO) 8 MG CAPS capsule Take 1 capsule (8 mg total) by mouth daily with breakfast. 30 capsule 11   spironolactone (ALDACTONE) 25 MG tablet Take 1 tablet (25 mg total) by mouth daily. 90 tablet 3   triamcinolone lotion (KENALOG) 0.1 % Apply 1 application topically 2 (two) times daily.     triamcinolone lotion (KENALOG) 0.1 % Apply topically.     No current facility-administered medications for this visit.    Allergies:   Iodinated contrast media and Other   Social History:  The patient  reports that he has quit smoking. His smoking use included cigarettes. He has a 20.00 pack-year smoking history. He has never used smokeless tobacco. He reports that he does not drink alcohol and does not use drugs.   Family History:   family history includes Angina in  his father; Anxiety disorder in his brother; Cancer in his father and another family member; Colon polyps in his sister; Coronary artery disease in an other family member; Emphysema in his father; Heart attack in his brother and mother; Heart disease in his maternal aunt; Heart failure in his father; Lymphoma in his sister.    Review of Systems: Review of Systems  Constitutional: Negative.   HENT: Negative.    Respiratory:  Positive for shortness of breath.   Cardiovascular:  Positive for leg swelling.  Gastrointestinal: Negative.   Musculoskeletal: Negative.   Neurological: Negative.   Psychiatric/Behavioral: Negative.    All other systems reviewed and are negative.   PHYSICAL EXAM: VS:  There were no vitals taken for this visit. , BMI There is no height or weight on file to calculate BMI. Constitutional:  oriented to person, place, and time. No distress.  HENT:  Head: Grossly normal Eyes:  no discharge. No scleral icterus.  Neck: No JVD, no carotid bruits  Cardiovascular: Regular rate and rhythm, no murmurs appreciated Trace lower extremity edema Pulmonary/Chest: Clear to auscultation bilaterally, no  wheezes or rails Abdominal: Soft.  no distension.  no tenderness.  Musculoskeletal: Normal range of motion Neurological:  normal muscle tone. Coordination normal. No atrophy Skin: Skin warm and dry Psychiatric: normal affect, pleasant  Recent Labs: 11/26/2021: BUN 21; Creatinine, Ser 1.30; Hemoglobin 13.7; Platelets 257; Potassium 4.1; Sodium 137    Lipid Panel Lab Results  Component Value Date   CHOL 147 06/06/2013   HDL 37.90 (L) 06/06/2013   LDLCALC 61 06/06/2013   TRIG 243.0 (H) 06/06/2013    Wt Readings from Last 3 Encounters:  05/11/22 205 lb (93 kg)  04/07/22 200 lb (90.7 kg)  12/13/21 205 lb 6 oz (93.2 kg)     ASSESSMENT AND PLAN:  Essential hypertension -  Blood pressure elevated, we will add spironolactone 25 mg daily  Chronic systolic and diastolic CHF Reports he is only taking Lasix 40 once a day not twice a day Has lower extremity edema Limited by incontinence Chronically depressed ejection fraction, echocardiogram  12/22 ejection fraction 35 to 40%, Myoview with no significant ischemia Farxiga 10 mg daily Add spionolactone 25 daily, decrease potassium down to 10 mill equivalents daily  Shortness of breath Likely multifactorial including obesity, deconditioning, fluid retention/pulmonary edema Recommend regular walking program, Lasix 40 twice daily  Carotid disease Stable, s/p CEA  History of TIA, prior left arm numbness Prior history of TIAs chronic small vessel disease Managed with Xarelto   Hyperlipidemia Cholesterol close to goal, LDL little bit high  Diabetes type 2 with complications Diet discretion recommended, walking program, avoid fast food and minimize eating out A1c greater than 7 Continue Farxiga 10 mg daily Underlying nephropathy, CR 1.5   Total encounter time more than 30 minutes  Greater than 50% was spent in counseling and coordination of care with the patient    No orders of the defined types were placed in this  encounter.      Signed, Dossie Arbour, M.D., Ph.D. 06/12/2022  [facility] [place], [place] [phone]

## 2022-06-13 ENCOUNTER — Ambulatory Visit: Payer: HMO | Admitting: Cardiovascular Disease

## 2022-06-13 DIAGNOSIS — I10 Essential (primary) hypertension: Secondary | ICD-10-CM

## 2022-06-13 DIAGNOSIS — I5042 Chronic combined systolic (congestive) and diastolic (congestive) heart failure: Secondary | ICD-10-CM

## 2022-06-13 DIAGNOSIS — I739 Peripheral vascular disease, unspecified: Secondary | ICD-10-CM

## 2022-06-13 DIAGNOSIS — I5032 Chronic diastolic (congestive) heart failure: Secondary | ICD-10-CM

## 2022-06-13 DIAGNOSIS — E1143 Type 2 diabetes mellitus with diabetic autonomic (poly)neuropathy: Secondary | ICD-10-CM

## 2022-06-13 DIAGNOSIS — I6523 Occlusion and stenosis of bilateral carotid arteries: Secondary | ICD-10-CM

## 2022-06-13 DIAGNOSIS — I2510 Atherosclerotic heart disease of native coronary artery without angina pectoris: Secondary | ICD-10-CM

## 2022-06-13 DIAGNOSIS — R42 Dizziness and giddiness: Secondary | ICD-10-CM

## 2022-06-13 DIAGNOSIS — E782 Mixed hyperlipidemia: Secondary | ICD-10-CM

## 2022-06-13 DIAGNOSIS — I428 Other cardiomyopathies: Secondary | ICD-10-CM

## 2022-06-16 ENCOUNTER — Other Ambulatory Visit: Payer: Self-pay | Admitting: Cardiovascular Disease

## 2022-06-16 DIAGNOSIS — G459 Transient cerebral ischemic attack, unspecified: Secondary | ICD-10-CM

## 2022-06-16 NOTE — Telephone Encounter (Signed)
Xarelto  refill request received. Pt is 82 years old, weight-93kg, Crea-1.30 on 11/26/21, last seen by Dr. Mariah Milling on 12/13/21 (per OV it statesOn xarelto for TIAs), Diagnosis-TIA, CrCl-56.63 mL/min; Dose is appropriate based on dosing criteria. Will send in refill to requested pharmacy.

## 2022-06-16 NOTE — Telephone Encounter (Signed)
Refill Request.  

## 2022-06-22 ENCOUNTER — Other Ambulatory Visit: Payer: Self-pay | Admitting: Urology

## 2022-07-04 NOTE — Progress Notes (Signed)
Cardiology Office Note  Date:  07/06/2022   ID:  Kevin Choi, DOB 14-Oct-1940, MRN [id number]  PCP:  Marisue Ivan, MD   Chief Complaint  Patient presents with   6 month follow up     Patient c/o shortness of breath with walking and bending over. Medications reviewed by the patient verballyl.      HPI:  82 year old gentleman with a history of  coronary artery disease,  bypass surgery with a LIMA to the LAD in 2002,  vein graft to the diagonal,  diabetes,  hypertension,  hyperlipidemia,  pleural effusion requiring a VATS,  carotid endarterectomy on the right in 2001,  TIA symptoms 3 times, once in 2002, October 2012 in March 2013. Status post bilateral total knee replacements 40% left carotid arterial disease resection of right frontal meningioma. On xarelto for TIAs EF 40 to 45% in 2019, down to 35 to 40% in 2022 Myoview January 2023, no significant ischemia who presents for routine followup of his coronary artery disease ,  leg swelling and  shortness of breath.  LOV October 2023  In follow-up today reports feeling relatively well Weight trending up 200 to 212 over the past year No significant edema, denies abdominal distention from fluid On lasix BID Previously reported history of incontinence  No regular exercise program Reports gets tired if he does too much  Labs  A1c  6.9, down from 7.8 Total chol 136, LDL 76 CR  1.5, down from 2.2  11/2021 in ER with migraine MR brain shows no acute findings small meningioma or schwannoma that has increased 2 mm in the last 4 years Given reglan  EKG personally reviewed by myself on todays visit Normal sinus rhythm 68 bpm with left bundle branch block rate   Past medical history reviewed Echo,  EF 35 to 40% on 12/22 In 2019 was 40 to 34%  Stress test 1/23: no ischemia  Numb left arm, 03/2019 Left hands fingers not moving well going through folders Numbness got worse Left lip numb Lasted 45 min, then  resolved without intervention No further episodes, etiology unclear  Previous MRI/MRA from 2019 reviewed Meningioma noted, chronic small vessel disease noted  05/2019 Carotid u/s, results reviewed left ICA are consistent with a 40-59% stenosis. Right is open, s/p CEA  Echo 2019 - Left ventricle: The cavity size was normal. There was moderate    concentric hypertrophy. Systolic function was mildly to    moderately reduced. The estimated ejection fraction was in the    range of 40% to 45%.   Previous hearing problem,  led to MRI of the brain Discovery of mass right side of the brain Evaluated at [facility] found to have meningioma Had resection  04/21/2017 surgery resection     May 14 2011  he had acute onset of slurred speech. He was playing cards shortly after and he was unable to do any math. Symptoms lasted for approximately one to 2 hours. Blood pressure prior to this event had been well controlled though during and after the event, he had severe hypertension with systolic pressures sometimes greater than 200. He was in the emergency room for 2 days with extensive testing.   PMH:   has a past medical history of Allergic rhinitis due to pollen, CAD (coronary artery disease) (2002), Chicken pox, Diabetes (HCC), Diabetes mellitus, Dysplastic nevus (01/25/2008), Elevated prostate specific antigen (PSA) (02/21/2001), Headache(784.0), Hemiplegic migraine, without mention of intractable migraine without mention of status migrainosus, HLD (hyperlipidemia), HTN (hypertension) (  05/08/2007), Hyperhidrosis, Impotence of organic origin (07/24/2007), Leg pain, Measles, Mumps, Nausea with vomiting, Neuropathy, Occlusion and stenosis of carotid artery without mention of cerebral infarction (09/15/2006), Peripheral neuropathy (01/31/2008), Peripheral vascular disease (HCC), Personal history of urinary calculi, Pleural effusion (2007), Restless legs syndrome (RLS) (01/10/2007), Slurred speech, and TIA  (transient ischemic attack).  PSH:    Past Surgical History:  Procedure Laterality Date   CAROTID ENDARTERECTOMY  2001   right   CATHETER REMOVAL Right 06/03/2019   CHOLECYSTECTOMY  2003   collapse lung  2007   CORONARY ARTERY BYPASS GRAFT  2001   KNEE SURGERY Left 1981   LUNG SURGERY  2007   Left; decortication left lung for persistant pleural effusion and PTX. admission ten days at [facility].   PAROTID GLAND TUMOR EXCISION  1973   VASECTOMY      Current Outpatient Medications  Medication Sig Dispense Refill   acetaminophen (TYLENOL) 500 MG tablet Take 500 mg by mouth every 6 (six) hours as needed. Patient states he can take 2 tablets daily as needed for pain     atorvastatin (LIPITOR) 40 MG tablet TAKE ONE TABLET BY MOUTH ONCE A DAY 90 tablet 3   cetirizine (ZYRTEC) 10 MG tablet Take 10 mg by mouth as needed.      fenofibrate 160 MG tablet Take 1 tablet by mouth daily.     finasteride (PROSCAR) 5 MG tablet TAKE ONE TABLET BY MOUTH ONCE A DAY 90 tablet 3   furosemide (LASIX) 40 MG tablet TAKE ONE TABLET BY MOUTH TWICE A DAY 180 tablet 3   gabapentin (NEURONTIN) 300 MG capsule Take 300 mg by mouth 3 (three) times daily.     glipiZIDE (GLUCOTROL XL) 10 MG 24 hr tablet Take 10 mg by mouth daily.     glucose blood test strip      ketoconazole (NIZORAL) 2 % shampoo Apply to scalp as directed. Leave in for 10 minutes before rinsing. 120 mL 2   losartan (COZAAR) 50 MG tablet Take 1 tablet (50 mg total) by mouth daily. 30 tablet 1   magnesium gluconate (MAGONATE) 500 MG tablet Take 500 mg by mouth daily.     metoCLOPramide (REGLAN) 10 MG tablet Take 1 tablet (10 mg total) by mouth every 8 (eight) hours as needed for nausea (headache). 15 tablet 0   metoprolol succinate (TOPROL-XL) 50 MG 24 hr tablet Take 0.5 tablets (25 mg total) by mouth daily. 45 tablet 3   Multiple Vitamin (MULTI-VITAMIN) tablet Take by mouth.     nitroGLYCERIN (NITROSTAT) 0.4 MG SL tablet Place 1 tablet (0.4 mg total)  under the tongue every 5 (five) minutes as needed for chest pain. 25 tablet 3   potassium chloride SA (KLOR-CON M) 10 MEQ tablet Take 1 tablet (10 mEq total) by mouth daily. 90 tablet 3   rivaroxaban (XARELTO) 20 MG TABS tablet TAKE ONE TABLET BY MOUTH ONCE A DAY 90 tablet 1   rOPINIRole (REQUIP) 0.5 MG tablet Take 0.5 mg by mouth at bedtime.     silodosin (RAPAFLO) 8 MG CAPS capsule Take 1 capsule (8 mg total) by mouth daily with breakfast. 30 capsule 11   spironolactone (ALDACTONE) 25 MG tablet Take 1 tablet (25 mg total) by mouth daily. 90 tablet 3   triamcinolone lotion (KENALOG) 0.1 % Apply 1 application topically 2 (two) times daily.     No current facility-administered medications for this visit.    Allergies:   Iodinated contrast media and Other  Social History:  The patient  reports that he has quit smoking. His smoking use included cigarettes. He has a 20.00 pack-year smoking history. He has never used smokeless tobacco. He reports that he does not drink alcohol and does not use drugs.   Family History:   family history includes Angina in his father; Anxiety disorder in his brother; Cancer in his father and another family member; Colon polyps in his sister; Coronary artery disease in an other family member; Emphysema in his father; Heart attack in his brother and mother; Heart disease in his maternal aunt; Heart failure in his father; Lymphoma in his sister.    Review of Systems: Review of Systems  Constitutional: Negative.   HENT: Negative.    Respiratory:  Positive for shortness of breath.   Cardiovascular:  Positive for leg swelling.  Gastrointestinal: Negative.   Musculoskeletal: Negative.   Neurological: Negative.   Psychiatric/Behavioral: Negative.    All other systems reviewed and are negative.   PHYSICAL EXAM: VS:  BP (!) 100/50 (BP Location: Left Arm, Patient Position: Sitting, Cuff Size: Normal)   Pulse 68   Ht 5' 8.5" (1.74 m)   Wt 212 lb 8 oz (96.4 kg)    SpO2 99%   BMI 31.84 kg/m  , BMI Body mass index is 31.84 kg/m. Constitutional:  oriented to person, place, and time. No distress.  HENT:  Head: Grossly normal Eyes:  no discharge. No scleral icterus.  Neck: No JVD, no carotid bruits  Cardiovascular: Regular rate and rhythm, no murmurs appreciated Pulmonary/Chest: Clear to auscultation bilaterally, no wheezes or rails Abdominal: Soft.  no distension.  no tenderness.  Musculoskeletal: Normal range of motion Neurological:  normal muscle tone. Coordination normal. No atrophy Skin: Skin warm and dry Psychiatric: normal affect, pleasant  Recent Labs: 11/26/2021: BUN 21; Creatinine, Ser 1.30; Hemoglobin 13.7; Platelets 257; Potassium 4.1; Sodium 137    Lipid Panel Lab Results  Component Value Date   CHOL 147 06/06/2013   HDL 37.90 (L) 06/06/2013   LDLCALC 61 06/06/2013   TRIG 243.0 (H) 06/06/2013    Wt Readings from Last 3 Encounters:  07/06/22 212 lb 8 oz (96.4 kg)  05/11/22 205 lb (93 kg)  04/07/22 200 lb (90.7 kg)     ASSESSMENT AND PLAN:  Essential hypertension -  Blood pressure is well controlled on today's visit. No changes made to the medications.  Chronic systolic and diastolic CHF Reports taking Lasix twice a day, no significant lower extremity edema No PND orthopnea Weight higher but asymptomatic Chronically depressed ejection fraction, echocardiogram  12/22 ejection fraction 35 to 40%, Myoview with no significant ischemia Continue current medications  Shortness of breath Recommend regular exercise program for conditioning Appears euvolemic  Carotid disease Stable, s/p CEA  History of TIA, prior left arm numbness Prior history of TIAs chronic small vessel disease Managed with Xarelto   Hyperlipidemia Cholesterol is at goal on the current lipid regimen. No changes to the medications were made.  Diabetes type 2 with complications Diet discretion recommended, walking program, avoid fast food and  minimize eating out A1c improving Continue Farxiga 10 mg daily Underlying nephropathy, CR 1.5   Total encounter time more than 30 minutes  Greater than 50% was spent in counseling and coordination of care with the patient    Orders Placed This Encounter  Procedures   EKG 12-Lead       Signed, Dossie Arbour, M.D., Ph.D. 07/06/2022  [facility] [place], [place] [phone]

## 2022-07-06 ENCOUNTER — Ambulatory Visit: Payer: HMO | Attending: Cardiovascular Disease | Admitting: Cardiovascular Disease

## 2022-07-06 ENCOUNTER — Encounter: Payer: Self-pay | Admitting: Cardiovascular Disease

## 2022-07-06 VITALS — BP 100/50 | HR 68 | Ht 68.5 in | Wt 212.5 lb

## 2022-07-06 DIAGNOSIS — I2510 Atherosclerotic heart disease of native coronary artery without angina pectoris: Secondary | ICD-10-CM

## 2022-07-06 DIAGNOSIS — E782 Mixed hyperlipidemia: Secondary | ICD-10-CM | POA: Diagnosis not present

## 2022-07-06 DIAGNOSIS — I10 Essential (primary) hypertension: Secondary | ICD-10-CM

## 2022-07-06 DIAGNOSIS — I6523 Occlusion and stenosis of bilateral carotid arteries: Secondary | ICD-10-CM | POA: Diagnosis not present

## 2022-07-06 DIAGNOSIS — I739 Peripheral vascular disease, unspecified: Secondary | ICD-10-CM

## 2022-07-06 DIAGNOSIS — N179 Acute kidney failure, unspecified: Secondary | ICD-10-CM

## 2022-07-06 DIAGNOSIS — Z7984 Long term (current) use of oral hypoglycemic drugs: Secondary | ICD-10-CM | POA: Diagnosis not present

## 2022-07-06 DIAGNOSIS — E1143 Type 2 diabetes mellitus with diabetic autonomic (poly)neuropathy: Secondary | ICD-10-CM | POA: Diagnosis not present

## 2022-07-06 DIAGNOSIS — I428 Other cardiomyopathies: Secondary | ICD-10-CM | POA: Diagnosis not present

## 2022-07-06 DIAGNOSIS — I5032 Chronic diastolic (congestive) heart failure: Secondary | ICD-10-CM

## 2022-07-06 MED ORDER — FUROSEMIDE 40 MG PO TABS: 40.0000 mg | ORAL_TABLET | Freq: Two times a day (BID) | ORAL | 3 refills | Status: DC

## 2022-07-06 MED ORDER — ATORVASTATIN CALCIUM 40 MG PO TABS: 40.0000 mg | ORAL_TABLET | Freq: Every day | ORAL | 3 refills | Status: AC

## 2022-07-06 MED ORDER — LOSARTAN POTASSIUM 50 MG PO TABS: 50.0000 mg | ORAL_TABLET | Freq: Every day | ORAL | 1 refills | Status: DC

## 2022-07-06 MED ORDER — SPIRONOLACTONE 25 MG PO TABS: 25.0000 mg | ORAL_TABLET | Freq: Every day | ORAL | 3 refills | Status: DC

## 2022-07-06 MED ORDER — METOPROLOL SUCCINATE ER 50 MG PO TB24: 25.0000 mg | ORAL_TABLET | Freq: Every day | ORAL | 3 refills | Status: DC

## 2022-07-06 NOTE — Patient Instructions (Signed)
Medication Instructions:  No changes  If you need a refill on your cardiac medications before your next appointment, please call your pharmacy.   Lab work: No new labs needed  Testing/Procedures: No new testing needed  Follow-Up: At [facility], you and your health needs are our priority.  As part of our continuing mission to provide you with exceptional heart care, we have created designated Provider Care Teams.  These Care Teams include your primary Cardiologist (physician) and Advanced Practice Providers (APPs -  Physician Assistants and Nurse Practitioners) who all work together to provide you with the care you need, when you need it.  You will need a follow up appointment in 12 months  Providers on your designated Care Team:   Christopher Berge, NP Ryan Dunn, PA-C Cadence Furth, PA-C  COVID-19 Vaccine Information can be found at: [email or web address] For questions related to vaccine distribution or appointments, please email [email or web address] or call [phone].   

## 2022-07-11 ENCOUNTER — Encounter: Payer: Self-pay | Admitting: Podiatry

## 2022-07-11 ENCOUNTER — Ambulatory Visit (INDEPENDENT_AMBULATORY_CARE_PROVIDER_SITE_OTHER): Payer: HMO | Admitting: Podiatry

## 2022-07-11 DIAGNOSIS — M7752 Other enthesopathy of left foot: Secondary | ICD-10-CM | POA: Diagnosis not present

## 2022-07-11 DIAGNOSIS — D2372 Other benign neoplasm of skin of left lower limb, including hip: Secondary | ICD-10-CM | POA: Diagnosis not present

## 2022-07-11 DIAGNOSIS — N400 Enlarged prostate without lower urinary tract symptoms: Secondary | ICD-10-CM | POA: Insufficient documentation

## 2022-07-11 DIAGNOSIS — M2010 Hallux valgus (acquired), unspecified foot: Secondary | ICD-10-CM

## 2022-07-11 MED ORDER — DEXAMETHASONE SODIUM PHOSPHATE 120 MG/30ML IJ SOLN
2.0000 mg | Freq: Once | INTRAMUSCULAR | Status: AC
Start: 2022-07-11 — End: 2022-07-11
  Administered 2022-07-11: 2 mg via INTRA_ARTICULAR

## 2022-07-12 NOTE — Progress Notes (Signed)
Subjective:  Patient ID: Kevin Choi, male    DOB: Jul 24, 1940,  MRN: [id number] HPI Chief Complaint  Patient presents with   Foot Pain    Sub 5th MPJ left - callused area x months, very tender walking   New Patient (Initial Visit)    Est pt 2020 - diabetic - last A1c was 6.7    82 y.o. male presents with the above complaint.   ROS: Denies fever chills nausea vomiting muscle aches pains calf pain back pain chest pain shortness of breath.  Past Medical History:  Diagnosis Date   Allergic rhinitis due to pollen    CAD (coronary artery disease) 2002   bypass   Chicken pox    Diabetes (HCC)    Diabetes mellitus    Dysplastic nevus 01/25/2008   Right mid back. Moderate atypia, close to margin.   Elevated prostate specific antigen (PSA) 02/21/2001   Headache(784.0)    Hemiplegic migraine, without mention of intractable migraine without mention of status migrainosus    HLD (hyperlipidemia)    HTN (hypertension) 05/08/2007   Hyperhidrosis    Impotence of organic origin 07/24/2007   Leg pain    Measles    Mumps    Nausea with vomiting    Neuropathy    Occlusion and stenosis of carotid artery without mention of cerebral infarction 09/15/2006   Peripheral neuropathy 01/31/2008   Peripheral vascular disease (HCC)    Personal history of urinary calculi    Pleural effusion 2007   with pneumothorax    Restless legs syndrome (RLS) 01/10/2007   Slurred speech    TIA (transient ischemic attack)    Patient stated 2 months ago   Past Surgical History:  Procedure Laterality Date   CAROTID ENDARTERECTOMY  2001   right   CATHETER REMOVAL Right 06/03/2019   CHOLECYSTECTOMY  2003   collapse lung  2007   CORONARY ARTERY BYPASS GRAFT  2001   KNEE SURGERY Left 1981   LUNG SURGERY  2007   Left; decortication left lung for persistant pleural effusion and PTX. admission ten days at [facility].   PAROTID GLAND TUMOR EXCISION  1973   VASECTOMY      Current Outpatient Medications:     potassium chloride (KLOR-CON) 10 MEQ tablet, Take 10 mEq by mouth daily., Disp: , Rfl:    acetaminophen (TYLENOL) 500 MG tablet, Take 500 mg by mouth every 6 (six) hours as needed. Patient states he can take 2 tablets daily as needed for pain, Disp: , Rfl:    atorvastatin (LIPITOR) 40 MG tablet, Take 1 tablet (40 mg total) by mouth daily., Disp: 90 tablet, Rfl: 3   cetirizine (ZYRTEC) 10 MG tablet, Take 10 mg by mouth as needed. , Disp: , Rfl:    fenofibrate 160 MG tablet, Take 1 tablet by mouth daily., Disp: , Rfl:    finasteride (PROSCAR) 5 MG tablet, TAKE ONE TABLET BY MOUTH ONCE A DAY, Disp: 90 tablet, Rfl: 3   furosemide (LASIX) 40 MG tablet, Take 1 tablet (40 mg total) by mouth 2 (two) times daily., Disp: 180 tablet, Rfl: 3   gabapentin (NEURONTIN) 300 MG capsule, Take 300 mg by mouth 3 (three) times daily., Disp: , Rfl:    glipiZIDE (GLUCOTROL XL) 10 MG 24 hr tablet, Take 10 mg by mouth daily., Disp: , Rfl:    glucose blood test strip, , Disp: , Rfl:    ketoconazole (NIZORAL) 2 % shampoo, Apply to scalp as directed. Leave in for 10  minutes before rinsing., Disp: 120 mL, Rfl: 2   losartan (COZAAR) 50 MG tablet, Take 1 tablet (50 mg total) by mouth daily., Disp: 30 tablet, Rfl: 1   magnesium gluconate (MAGONATE) 500 MG tablet, Take 500 mg by mouth daily., Disp: , Rfl:    metoCLOPramide (REGLAN) 10 MG tablet, Take 1 tablet (10 mg total) by mouth every 8 (eight) hours as needed for nausea (headache)., Disp: 15 tablet, Rfl: 0   metoprolol succinate (TOPROL-XL) 50 MG 24 hr tablet, Take 0.5 tablets (25 mg total) by mouth daily., Disp: 45 tablet, Rfl: 3   Multiple Vitamin (MULTI-VITAMIN) tablet, Take by mouth., Disp: , Rfl:    nitroGLYCERIN (NITROSTAT) 0.4 MG SL tablet, Place 1 tablet (0.4 mg total) under the tongue every 5 (five) minutes as needed for chest pain., Disp: 25 tablet, Rfl: 3   potassium chloride SA (KLOR-CON M) 10 MEQ tablet, Take 1 tablet (10 mEq total) by mouth daily., Disp: 90  tablet, Rfl: 3   rivaroxaban (XARELTO) 20 MG TABS tablet, TAKE ONE TABLET BY MOUTH ONCE A DAY, Disp: 90 tablet, Rfl: 1   rOPINIRole (REQUIP) 0.5 MG tablet, Take 0.5 mg by mouth at bedtime., Disp: , Rfl:    silodosin (RAPAFLO) 8 MG CAPS capsule, Take 1 capsule (8 mg total) by mouth daily with breakfast., Disp: 30 capsule, Rfl: 11   spironolactone (ALDACTONE) 25 MG tablet, Take 1 tablet (25 mg total) by mouth daily., Disp: 90 tablet, Rfl: 3   triamcinolone lotion (KENALOG) 0.1 %, Apply 1 application topically 2 (two) times daily., Disp: , Rfl:   Allergies  Allergen Reactions   Iodinated Contrast Media Nausea Only    Hypotension  Hypotension Hypotension    Hypotension   Other Nausea Only and Other (See Comments)    Contrast Dye-drops blood pressure.  Contrast Dye- hypotension and nausa and syncope  Contrast Dye-drops blood pressure. Contrast Dye- hypotension and nausa and syncope    Contrast Dye- hypotension and nausa and syncope   Review of Systems Objective:  There were no vitals filed for this visit.  General: Well developed, nourished, in no acute distress, alert and oriented x3   Dermatological: Skin is warm, dry and supple bilateral. Nails x 10 are well maintained; remaining integument appears unremarkable at this time. There are no open sores, no preulcerative lesions, no rash or signs of infection present.  He does have a reactive hyper keratoma subfifth metatarsal head of the left foot.  Beneath this area there does appear to be a bursitis mild erythema with fluctuance on palpation and tenderness.  Vascular: Dorsalis Pedis artery and Posterior Tibial artery pedal pulses are 2/4 bilateral with immedate capillary fill time. Pedal hair growth present. No varicosities and no lower extremity edema present bilateral.   Neruologic: Grossly intact via light touch bilateral. Vibratory intact via tuning fork bilateral. Protective threshold with Semmes Wienstein monofilament intact to  all pedal sites bilateral. Patellar and Achilles deep tendon reflexes 2+ bilateral. No Babinski or clonus noted bilateral.   Musculoskeletal: No gross boney pedal deformities bilateral. No pain, crepitus, or limitation noted with foot and ankle range of motion bilateral. Muscular strength 5/5 in all groups tested bilateral.  Bursitis.  Mild tailor's bunion deformity bilateral left greater than right  Gait: Unassisted, Nonantalgic.    Radiographs:  None taken  Assessment & Plan:   Assessment: Mild tailor's bunion deformity resulting in subfifth metatarsal head bursitis and benign skin lesion.  Plan: Injected the bursitis today 3 mg of dexamethasone  and local anesthetic debrided the porokeratotic benign lesion.     Valena Ivanov T. [place], [place]

## 2022-08-10 ENCOUNTER — Other Ambulatory Visit: Payer: Self-pay | Admitting: Cardiovascular Disease

## 2022-08-10 ENCOUNTER — Encounter: Payer: Self-pay | Admitting: Dietician

## 2022-08-10 ENCOUNTER — Encounter: Payer: HMO | Attending: Family Medicine | Admitting: Dietician

## 2022-08-10 VITALS — Ht 68.5 in | Wt 214.5 lb

## 2022-08-10 DIAGNOSIS — Z713 Dietary counseling and surveillance: Secondary | ICD-10-CM | POA: Diagnosis not present

## 2022-08-10 DIAGNOSIS — E1143 Type 2 diabetes mellitus with diabetic autonomic (poly)neuropathy: Secondary | ICD-10-CM | POA: Insufficient documentation

## 2022-08-10 DIAGNOSIS — I10 Essential (primary) hypertension: Secondary | ICD-10-CM

## 2022-08-10 DIAGNOSIS — E1122 Type 2 diabetes mellitus with diabetic chronic kidney disease: Secondary | ICD-10-CM

## 2022-08-10 DIAGNOSIS — I5022 Chronic systolic (congestive) heart failure: Secondary | ICD-10-CM

## 2022-08-10 DIAGNOSIS — E782 Mixed hyperlipidemia: Secondary | ICD-10-CM

## 2022-08-10 NOTE — Patient Instructions (Signed)
Keep up the good, healthy food choices, great job! Continue to limit sodium, the goal would be about 2000mg  daily or less, or about 600mg  for each meal (average) Using herb-based salt substitutes is fine, such as Mrs Sharilyn Sites or [employer] Perfect Pinch. Avoid potassium chloride substitutes such as Salt Sense, NuSalt, and others.  Keep portions of meats to palm-size (3oz) or less. Combining with veggies as in stir-fry or soup helps control meat portions.  Choose low sodium soups, broths, bouillon. Ask for unsalted foods at restaurants.

## 2022-08-10 NOTE — Progress Notes (Signed)
Medical Nutrition Therapy: Visit start time: 1315  end time: 1430  Assessment:   Referral Diagnosis: Type 2 Diabetes, CKD Stage 3, HLD Other medical history/ diagnoses: CHF Psychosocial issues/ stress concerns: none  Medications, supplements: reconciled list in medical record   Preferred learning method:  Auditory Visual Hands-on   Current weight: 214.5lbs Height: 5'8.5"  BMI: 32.14 Patient's personal weight goal: 195lbs  Progress and evaluation:  Patient reports recent diagnosis of CKD, at stage 3.   He reports making significant diet changes since diagnosis 01/2022; ie using sodium free seasonings in cooking, fewer restaurant meals. Patient reports 11 year history of diabetes; he states he has attended diabetes management classes through his pharmacy.  Wife also has diabetes, is unable to cook due to health issues; patient is preparing all meals. Recent labs 04/21/22 : HbA1C 6.9%; glucose 130, sodium 138, potassium 4.6, creatinine 1.5, GFR 46 Feels at a loss as to what he can and can't eat Food allergies: none Special diet practices: none Patient seeks help with diet for managing diabetes along with kidney disease    Dietary Intake:  Usual eating pattern includes 3 meals and 1-2 snacks per day. Dining out frequency: 1-3 meals per week. Who plans meals/ buys groceries? self Who prepares meals? self  Breakfast: breakfast bar, [employer] with 1-2c black coffee; occ biscuit with sausage; occ just coffee Snack: none Lunch: beef stew/ veg soup; stir fry veg and beef; chili homemade with beef, chili-o seasoning, pinto beans; pork chops skillet with hash browns; chicken and rice soup (canned); peanut butter sandwich; ham salad sandwich; taco salad ground beef, crushed tostitos, lettuce, onion, tomato, cheese, green goddess dressing; out 1-2x a week grilled or broiled fish; almond chicken with veg and rice asks for no MSG; other salad with soup at [employer]; cobb salad at [employer],  apple pecan salad at [employer]; burger out or homemade; sides = fresh mixed fruit/ corn on cob with unsalted butter; other veg  Snack: apple/ banana/ fresh mixed fruits Supper: similar to lunch; sometimes popcorn with olive oil +/or salt free butter Snack: same as pm Beverages: water, unsweetened iced tea no sweetener; coffee in am; diet peach tea; occasionally pepsi zero  Physical activity: walking or cycling (3-wheel) 20 minutes, 1-2 times a week; reports he gets short of breath easily due to CHF   Intervention:   Nutrition Care Education:   Basic nutrition: basic food groups; appropriate nutrient balance; general nutrition guidelines    Weight control: importance of low sugar and low fat choices; appropriate food portions; estimated energy needs for weight loss at 1600 kcal, provided guidance for 50% CHO, 20% pro, (30% fat) Advanced nutrition: cooking techniques; dining out; food label reading Diabetes:  appropriate carb intake and balance, healthy carb choices; role of fiber CKD: limiting sodium, goals for sodium intake; high sodium foods to limit/ avoid; alternative seasonings, importance of measures to limit sodium in restaurant meals; monitoring weight for fluid retention for CHF; controlling protein intake and appropriate portions of meats, nuts; discussed limiting phosphorus additives in foods/ beverages, but limiting naturally occurring sources only if phosphorus becomes elevated. Discussed limiting potassium sources only if blood potassium becomes elevated.  Other intervention notes: Patient seems to be making overall appropriate and healthy food choices. He has begun to cook his own meals, and is motivated to continue healthy habits.  Established additional goals for change with direction from patient. No follow up scheduled at this time; patient will schedule later if needed.   Nutritional  Diagnosis:  [place]-2.1 Inpaired nutrition utilization and [employer]-2.2 Altered nutrition-related  laboratory As related to Diabetes and CKD.  As evidenced by elevated HbA1C, low GFR. [place]-3.3 Overweight/obesity As related to history of excess calories, inadequate physical activity.  As evidenced by patient with current BMI of 32.14.   Education Materials given:  General diet guidelines for Diabetes Renal Disease Food Pyramid and lists ([place]) Plate Planner with food lists, sample meal pattern Sample menus Diabetes-Friendly Recipes Visit summary with goals/ instructions   Learner/ who was taught:  Patient   Level of understanding: Verbalizes/ demonstrates competency   Demonstrated degree of understanding via:   Teach back Learning barriers: None   Willingness to learn/ readiness for change: Eager, change in progress  Monitoring and Evaluation:  Dietary intake, exercise, BG control, renal function, and body weight      follow up: prn

## 2022-10-14 ENCOUNTER — Ambulatory Visit
Admission: RE | Admit: 2022-10-14 | Discharge: 2022-10-14 | Disposition: A | Discharge status: Home / Self Care | Payer: HMO | Source: Ambulatory Visit | Attending: Urology | Admitting: Urology

## 2022-10-14 DIAGNOSIS — N281 Cyst of kidney, acquired: Secondary | ICD-10-CM | POA: Insufficient documentation

## 2022-10-26 DIAGNOSIS — Z862 Personal history of diseases of the blood and blood-forming organs and certain disorders involving the immune mechanism: Secondary | ICD-10-CM | POA: Diagnosis not present

## 2022-10-26 DIAGNOSIS — E782 Mixed hyperlipidemia: Secondary | ICD-10-CM | POA: Diagnosis not present

## 2022-10-26 DIAGNOSIS — E1143 Type 2 diabetes mellitus with diabetic autonomic (poly)neuropathy: Secondary | ICD-10-CM | POA: Diagnosis not present

## 2022-10-26 DIAGNOSIS — N1831 Chronic kidney disease, stage 3a: Secondary | ICD-10-CM | POA: Diagnosis not present

## 2022-10-31 DIAGNOSIS — I129 Hypertensive chronic kidney disease with stage 1 through stage 4 chronic kidney disease, or unspecified chronic kidney disease: Secondary | ICD-10-CM | POA: Diagnosis not present

## 2022-10-31 DIAGNOSIS — N2889 Other specified disorders of kidney and ureter: Secondary | ICD-10-CM | POA: Diagnosis not present

## 2022-10-31 DIAGNOSIS — E1122 Type 2 diabetes mellitus with diabetic chronic kidney disease: Secondary | ICD-10-CM | POA: Diagnosis not present

## 2022-10-31 DIAGNOSIS — N1831 Chronic kidney disease, stage 3a: Secondary | ICD-10-CM | POA: Diagnosis not present

## 2022-10-31 DIAGNOSIS — N189 Chronic kidney disease, unspecified: Secondary | ICD-10-CM | POA: Diagnosis not present

## 2022-11-02 ENCOUNTER — Telehealth: Payer: Self-pay | Admitting: Cardiovascular Disease

## 2022-11-02 DIAGNOSIS — Z862 Personal history of diseases of the blood and blood-forming organs and certain disorders involving the immune mechanism: Secondary | ICD-10-CM | POA: Diagnosis not present

## 2022-11-02 DIAGNOSIS — I10 Essential (primary) hypertension: Secondary | ICD-10-CM | POA: Diagnosis not present

## 2022-11-02 DIAGNOSIS — E782 Mixed hyperlipidemia: Secondary | ICD-10-CM | POA: Diagnosis not present

## 2022-11-02 DIAGNOSIS — I5022 Chronic systolic (congestive) heart failure: Secondary | ICD-10-CM | POA: Diagnosis not present

## 2022-11-02 DIAGNOSIS — Z0000 Encounter for general adult medical examination without abnormal findings: Secondary | ICD-10-CM | POA: Diagnosis not present

## 2022-11-02 DIAGNOSIS — E1143 Type 2 diabetes mellitus with diabetic autonomic (poly)neuropathy: Secondary | ICD-10-CM | POA: Diagnosis not present

## 2022-11-02 DIAGNOSIS — I429 Cardiomyopathy, unspecified: Secondary | ICD-10-CM

## 2022-11-02 DIAGNOSIS — N1832 Chronic kidney disease, stage 3b: Secondary | ICD-10-CM | POA: Diagnosis not present

## 2022-11-02 NOTE — Telephone Encounter (Signed)
Patient would like to echo done. Please advise

## 2022-11-04 NOTE — Telephone Encounter (Signed)
Called patient and notified him of the following from Dr. Mariah Milling.  We can order echo Dx: Cardiomyopathy Thx TG   Patient verbalizes understanding. Orders placed for Echo.

## 2022-11-07 DIAGNOSIS — N1832 Chronic kidney disease, stage 3b: Secondary | ICD-10-CM | POA: Diagnosis not present

## 2022-11-07 DIAGNOSIS — R600 Localized edema: Secondary | ICD-10-CM | POA: Diagnosis not present

## 2022-11-07 DIAGNOSIS — I10 Essential (primary) hypertension: Secondary | ICD-10-CM | POA: Diagnosis not present

## 2022-11-07 DIAGNOSIS — I129 Hypertensive chronic kidney disease with stage 1 through stage 4 chronic kidney disease, or unspecified chronic kidney disease: Secondary | ICD-10-CM | POA: Diagnosis not present

## 2022-11-07 DIAGNOSIS — N189 Chronic kidney disease, unspecified: Secondary | ICD-10-CM | POA: Diagnosis not present

## 2022-11-07 DIAGNOSIS — E1122 Type 2 diabetes mellitus with diabetic chronic kidney disease: Secondary | ICD-10-CM | POA: Diagnosis not present

## 2022-11-11 ENCOUNTER — Ambulatory Visit: Payer: HMO | Admitting: Urology

## 2022-11-18 ENCOUNTER — Ambulatory Visit (INDEPENDENT_AMBULATORY_CARE_PROVIDER_SITE_OTHER): Payer: HMO | Admitting: Urology

## 2022-11-18 ENCOUNTER — Encounter: Payer: Self-pay | Admitting: Urology

## 2022-11-18 VITALS — BP 128/70 | HR 74 | Ht 68.5 in | Wt 212.2 lb

## 2022-11-18 DIAGNOSIS — R3914 Feeling of incomplete bladder emptying: Secondary | ICD-10-CM

## 2022-11-18 DIAGNOSIS — N281 Cyst of kidney, acquired: Secondary | ICD-10-CM | POA: Diagnosis not present

## 2022-11-18 DIAGNOSIS — N401 Enlarged prostate with lower urinary tract symptoms: Secondary | ICD-10-CM | POA: Diagnosis not present

## 2022-11-18 LAB — BLADDER SCAN AMB NON-IMAGING: Scan Result: 123

## 2022-11-18 MED ORDER — SILODOSIN 8 MG PO CAPS: 8.0000 mg | ORAL_CAPSULE | Freq: Every day | ORAL | 11 refills | Status: DC

## 2022-11-18 MED ORDER — FINASTERIDE 5 MG PO TABS: 5.0000 mg | ORAL_TABLET | Freq: Every day | ORAL | 3 refills | Status: DC

## 2022-11-18 NOTE — Progress Notes (Signed)
I, Kevin Choi, acting as a scribe for Kevin Altes, MD., have documented all relevant documentation on the behalf of Kevin Altes, MD, as directed by Kevin Altes, MD while in the presence of Kevin Altes, MD.  11/18/2022 11:53 AM   Kevin Choi [birth year] [id number]  Referring provider: Marisue Ivan, MD [phone] [facility] [address] [facility] [place],  [place] [address]  Chief Complaint  Patient presents with   Benign Prostatic Hypertrophy   Urologic history: 1.  BPH with lower urinary tract symptoms On finasteride and Silodosin Mild to moderate residual   2.  History elevated PSA History of several biopsies between 2003-2010 No records available; all benign per patient Baseline uncorrected PSA upper 1 range Prostate cancer screening discontinued  HPI: Kevin Choi is a 82 y.o. male presents for a 6 month follow-up.   Since last visit, lower urinary tract symptoms have been stable.  Remains all on silodosin and finasteride, and states his voiding symptoms are presently not bothersome.  Denies dysuria, gross hematuria Denies flank, abdominal, pelvic pain.  Ultrasound February 2024 showed a small right renal mass for which follow-up ultrasound was recommended. Repeat renal ultrasound 10/14/22 showed a 4.4 mid-pole anechoic mass in the right kidney and a 1.3 cm anechoic lower pole mass. There was a  hyperchoic area in the lower pole, suggesting a parenchymal calcification. There were also cystic masses in the left kidney. The majority of the cysts were consistent with Bosniak 1 simple cyst. The anechoic lower pole mass in the right kidney was felt most likely to be a benign cyst, but was less well-defined. A 6 month follow-up ultrasound was recommended versus a renal mass protocol MRI.   PMH: Past Medical History:  Diagnosis Date   Allergic rhinitis due to pollen    CAD (coronary artery disease) 2002   bypass   Chicken pox    Diabetes (HCC)     Diabetes mellitus    Dysplastic nevus 01/25/2008   Right mid back. Moderate atypia, close to margin.   Elevated prostate specific antigen (PSA) 02/21/2001   Headache(784.0)    Hemiplegic migraine, without mention of intractable migraine without mention of status migrainosus    HLD (hyperlipidemia)    HTN (hypertension) 05/08/2007   Hyperhidrosis    Impotence of organic origin 07/24/2007   Leg pain    Measles    Mumps    Nausea with vomiting    Neuropathy    Occlusion and stenosis of carotid artery without mention of cerebral infarction 09/15/2006   Peripheral neuropathy 01/31/2008   Peripheral vascular disease (HCC)    Personal history of urinary calculi    Pleural effusion 2007   with pneumothorax    Restless legs syndrome (RLS) 01/10/2007   Slurred speech    TIA (transient ischemic attack)    Patient stated 2 months ago    Surgical History: Past Surgical History:  Procedure Laterality Date   CAROTID ENDARTERECTOMY  2001   right   CATHETER REMOVAL Right 06/03/2019   CHOLECYSTECTOMY  2003   collapse lung  2007   CORONARY ARTERY BYPASS GRAFT  2001   KNEE SURGERY Left 1981   LUNG SURGERY  2007   Left; decortication left lung for persistant pleural effusion and PTX. admission ten days at [facility]   PAROTID GLAND TUMOR EXCISION  1973   VASECTOMY      Home Medications:  Allergies as of 11/18/2022       Reactions  Iodinated Contrast Media Nausea Only   Hypotension Hypotension Hypotension    Hypotension   Other Nausea Only, Other (See Comments)   Contrast Dye-drops blood pressure. Contrast Dye- hypotension and nausa and syncope Contrast Dye-drops blood pressure. Contrast Dye- hypotension and nausa and syncope    Contrast Dye- hypotension and nausa and syncope        Medication List        Accurate as of November 18, 2022 11:53 AM. If you have any questions, ask your nurse or doctor.          acetaminophen 500 MG tablet Commonly known as: TYLENOL Take  500 mg by mouth every 6 (six) hours as needed. Patient states he can take 2 tablets daily as needed for pain   atorvastatin 40 MG tablet Commonly known as: LIPITOR Take 1 tablet (40 mg total) by mouth daily.   cetirizine 10 MG tablet Commonly known as: ZYRTEC Take 10 mg by mouth as needed.   fenofibrate 160 MG tablet Take 1 tablet by mouth daily.   finasteride 5 MG tablet Commonly known as: PROSCAR Take 1 tablet (5 mg total) by mouth daily.   furosemide 40 MG tablet Commonly known as: LASIX Take 1 tablet (40 mg total) by mouth 2 (two) times daily.   gabapentin 300 MG capsule Commonly known as: NEURONTIN Take 300 mg by mouth 3 (three) times daily.   glipiZIDE 10 MG 24 hr tablet Commonly known as: GLUCOTROL XL Take 10 mg by mouth daily.   glucose blood test strip   ketoconazole 2 % shampoo Commonly known as: NIZORAL Apply to scalp as directed. Leave in for 10 minutes before rinsing.   losartan 25 MG tablet Commonly known as: COZAAR Take 25 mg by mouth daily. What changed: Another medication with the same name was removed. Continue taking this medication, and follow the directions you see here. Changed by: Kevin Choi   magnesium gluconate 500 MG tablet Commonly known as: MAGONATE Take 500 mg by mouth daily.   metoCLOPramide 10 MG tablet Commonly known as: REGLAN Take 1 tablet (10 mg total) by mouth every 8 (eight) hours as needed for nausea (headache).   metoprolol succinate 50 MG 24 hr tablet Commonly known as: TOPROL-XL Take 0.5 tablets (25 mg total) by mouth daily.   Multi-Vitamin tablet Take by mouth.   nitroGLYCERIN 0.4 MG SL tablet Commonly known as: Nitrostat Place 1 tablet (0.4 mg total) under the tongue every 5 (five) minutes as needed for chest pain.   potassium chloride 10 MEQ tablet Commonly known as: KLOR-CON Take 10 mEq by mouth daily.   rOPINIRole 0.5 MG tablet Commonly known as: REQUIP Take 0.5 mg by mouth at bedtime.   silodosin 8  MG Caps capsule Commonly known as: RAPAFLO Take 1 capsule (8 mg total) by mouth daily with breakfast.   spironolactone 25 MG tablet Commonly known as: ALDACTONE Take 1 tablet (25 mg total) by mouth daily.   triamcinolone lotion 0.1 % Commonly known as: KENALOG Apply 1 application topically 2 (two) times daily.   Xarelto 20 MG Tabs tablet Generic drug: rivaroxaban TAKE ONE TABLET BY MOUTH ONCE A DAY        Allergies:  Allergies  Allergen Reactions   Iodinated Contrast Media Nausea Only    Hypotension  Hypotension Hypotension    Hypotension   Other Nausea Only and Other (See Comments)    Contrast Dye-drops blood pressure.  Contrast Dye- hypotension and nausa and syncope  Contrast Dye-drops  blood pressure. Contrast Dye- hypotension and nausa and syncope    Contrast Dye- hypotension and nausa and syncope    Family History: Family History  Problem Relation Age of Onset   Heart attack Mother    Heart failure Father    Angina Father    Emphysema Father    Cancer Father        prostate   Lymphoma Sister        Non-Hodgkins   Cancer Other    Coronary artery disease Other        brother   Heart attack Brother    Heart disease Maternal Aunt    Colon polyps Sister    Anxiety disorder Brother     Social History:  reports that he has quit smoking. His smoking use included cigarettes. He has a 20 pack-year smoking history. He has never used smokeless tobacco. He reports that he does not drink alcohol and does not use drugs.   Physical Exam: BP 128/70   Pulse 74   Ht 5' 8.5" (1.74 m)   Wt 212 lb 3 oz (96.2 kg)   BMI 31.79 kg/m   Constitutional:  Alert and oriented, No acute distress. HEENT: [place] AT Respiratory: Normal respiratory effort, no increased work of breathing. GU: Prostate 35 grams, smooth without nodules. Psychiatric: Normal mood and affect.   Pertinent Imaging: Renal ultrasound was personally reviewed and interpreted.     Ultrasound EXAM: RENAL / URINARY TRACT ULTRASOUND COMPLETE   COMPARISON:  None Available.   FINDINGS: Right Kidney:   Renal measurements: 10.4 x 5.3 x 4.8 cm = volume: 139 mL. Contains a 11 mm hypoechoic mass.   Left Kidney:   Renal measurements: 11.6 x 5.5 x 5.4 cm = volume: 180 mL. Contains an 11 mm hypoechoic mass in the upper pole. Contains a simple cyst in the midpole. No follow-up imaging recommended for the cyst.   Bladder:   Appears normal for degree of bladder distention.   Other:   The prostate volume is 40 cc.   IMPRESSION: 1. There is an 11 mm hypoechoic mass in each kidney. A complicated cyst, too small to characterize, is favored. Recommend a follow-up ultrasound in 6 months to ensure stability. 2. Enlarged prostate with a volume of 40 cc. 3. No other abnormalities.     Electronically Signed   By: Gerome Sam III M.D.   On: 04/11/2022 20:06  US RENAL  Narrative CLINICAL DATA:  Bilateral renal cysts follow-up.  EXAM: RENAL / URINARY TRACT ULTRASOUND COMPLETE  COMPARISON:  04/11/2022.  FINDINGS: Right Kidney:  Renal measurements: 9.7 x 5.2 x 5.1 cm = volume: 135.7 mL. Normal parenchymal echogenicity. Midpole anechoic, exophytic mass, 4.4 x 0.9 x 1.3 cm. Hypo to anechoic lower pole mass, 1.3 x 1.3 x 1 3 cm. Hyperechoic area in the lower pole suggesting a parenchymal calcification, 8 mm in long axis.  No stones.  No hydronephrosis.  Left Kidney:  Renal measurements: 10.4 x 5.1 x 5 1 cm = volume: 143 mL. Normal parenchymal echogenicity. Anechoic midpole cortical mass, partly exophytic, 2.3 x 1.9 x 1.9 cm. Small upper pole the cystic mass measuring 1.1 x 0.9 x 1.1 cm. No stones. No hydronephrosis.  Bladder:  Appears normal for degree of bladder distention.  Other:  None.  IMPRESSION: 1. No acute findings.  No hydronephrosis. 2. Right and left renal cysts. Cyst in the midpole the right kidney and the 2 left cysts detailed  above are stable. These are consistent  benign, Bosniak category 1 cysts with no additional follow-up recommended. 3. Mostly anechoic mass from the lower pole of the right kidney is suspected to be a benign cyst, but is less well-defined. It measures larger than on the prior exam 1-2 mm in each dimension. 4. Focal area of hyperechogenicity in the lower pole the left kidney suspect to be a parenchymal calcification, not evident on the prior study. 5. Recommend either six-month follow-up renal ultrasound to reassess the right lower pole probable cyst and hyperechoic lesion, or further characterization using renal MRI without and with contrast.   Electronically Signed By: Amie Portland M.D. On: 10/21/2022 11:25   Assessment & Plan:    1. BPH with LUTS  Stable on silidosin and finasteride which were refilled.  PVR improved at 123 mL (222 mL last visit). Follow-up 1 year with PVR.   2. Renal cysts Discussed option of follow-up ultrasound 6 months versus renal mass protocol MRI, and he elected a 6 month follow-up ultrasound.  Renal ultrasound order placed, and he will be notified with the results   I have reviewed the above documentation for accuracy and completeness, and I agree with the above.   Kevin Altes, MD  [facility] [address], [address] [place], [place] [address] [phone]

## 2022-11-29 ENCOUNTER — Ambulatory Visit: Payer: HMO | Attending: Cardiovascular Disease

## 2022-11-29 DIAGNOSIS — I429 Cardiomyopathy, unspecified: Secondary | ICD-10-CM

## 2022-11-29 LAB — ECHOCARDIOGRAM COMPLETE
Area-P 1/2: 2.87 cm2
S' Lateral: 4.1 cm

## 2022-11-29 MED ORDER — PERFLUTREN LIPID MICROSPHERE
1.0000 mL | INTRAVENOUS | Status: AC | PRN
Start: 2022-11-29 — End: 2022-11-29
  Administered 2022-11-29: 4 mL via INTRAVENOUS

## 2022-12-26 ENCOUNTER — Other Ambulatory Visit: Payer: Self-pay | Admitting: Cardiovascular Disease

## 2022-12-26 DIAGNOSIS — G459 Transient cerebral ischemic attack, unspecified: Secondary | ICD-10-CM

## 2022-12-26 NOTE — Telephone Encounter (Signed)
Disregard Lauren meant to send to Dr's nurse.

## 2022-12-26 NOTE — Telephone Encounter (Signed)
Please advise if ok to refill Historical medication. 

## 2022-12-26 NOTE — Telephone Encounter (Signed)
Prescription refill request for Xarelto received.  Indication:tia Last office visit:5/24 Weight:96.2  kg Age:82 Scr:2.0  9/24 CrCl:37.41  ml/min  Prescription refilled

## 2022-12-26 NOTE — Telephone Encounter (Signed)
Please advise for refill on historical medication.

## 2022-12-27 ENCOUNTER — Telehealth: Payer: Self-pay | Admitting: Cardiovascular Disease

## 2022-12-27 NOTE — Telephone Encounter (Signed)
*  STAT* If patient is at the pharmacy, call can be transferred to refill team.   1. Which medications need to be refilled? (please list name of each medication and dose if known) potassium chloride (KLOR-CON) 10 MEQ tablet   2. Which pharmacy/location (including street and city if local pharmacy) is medication to be sent to?  [facility] - [place], [place] - [address]    3. Do they need a 30 day or 90 day supply? 90

## 2022-12-27 NOTE — Telephone Encounter (Signed)
Please advise if ok to refill historical medication. ? ?

## 2022-12-27 NOTE — Telephone Encounter (Signed)
Pt confirmed that he is taking Potassium 10 meq once daily.

## 2022-12-27 NOTE — Telephone Encounter (Signed)
Called patient and left message for a call back.  

## 2023-01-10 DIAGNOSIS — H90A22 Sensorineural hearing loss, unilateral, left ear, with restricted hearing on the contralateral side: Secondary | ICD-10-CM | POA: Diagnosis not present

## 2023-01-26 DIAGNOSIS — E782 Mixed hyperlipidemia: Secondary | ICD-10-CM | POA: Diagnosis not present

## 2023-01-26 DIAGNOSIS — E1143 Type 2 diabetes mellitus with diabetic autonomic (poly)neuropathy: Secondary | ICD-10-CM | POA: Diagnosis not present

## 2023-01-26 DIAGNOSIS — Z862 Personal history of diseases of the blood and blood-forming organs and certain disorders involving the immune mechanism: Secondary | ICD-10-CM | POA: Diagnosis not present

## 2023-02-02 DIAGNOSIS — N1832 Chronic kidney disease, stage 3b: Secondary | ICD-10-CM | POA: Diagnosis not present

## 2023-02-02 DIAGNOSIS — I10 Essential (primary) hypertension: Secondary | ICD-10-CM | POA: Diagnosis not present

## 2023-02-02 DIAGNOSIS — E1143 Type 2 diabetes mellitus with diabetic autonomic (poly)neuropathy: Secondary | ICD-10-CM | POA: Diagnosis not present

## 2023-02-02 DIAGNOSIS — Z862 Personal history of diseases of the blood and blood-forming organs and certain disorders involving the immune mechanism: Secondary | ICD-10-CM | POA: Diagnosis not present

## 2023-02-02 DIAGNOSIS — E782 Mixed hyperlipidemia: Secondary | ICD-10-CM | POA: Diagnosis not present

## 2023-03-06 DIAGNOSIS — N1832 Chronic kidney disease, stage 3b: Secondary | ICD-10-CM | POA: Diagnosis not present

## 2023-03-06 DIAGNOSIS — E1122 Type 2 diabetes mellitus with diabetic chronic kidney disease: Secondary | ICD-10-CM | POA: Diagnosis not present

## 2023-03-06 DIAGNOSIS — R600 Localized edema: Secondary | ICD-10-CM | POA: Diagnosis not present

## 2023-03-06 DIAGNOSIS — I129 Hypertensive chronic kidney disease with stage 1 through stage 4 chronic kidney disease, or unspecified chronic kidney disease: Secondary | ICD-10-CM | POA: Diagnosis not present

## 2023-03-06 DIAGNOSIS — N189 Chronic kidney disease, unspecified: Secondary | ICD-10-CM | POA: Diagnosis not present

## 2023-03-06 DIAGNOSIS — I10 Essential (primary) hypertension: Secondary | ICD-10-CM | POA: Diagnosis not present

## 2023-03-13 DIAGNOSIS — R600 Localized edema: Secondary | ICD-10-CM | POA: Diagnosis not present

## 2023-03-13 DIAGNOSIS — N1832 Chronic kidney disease, stage 3b: Secondary | ICD-10-CM | POA: Diagnosis not present

## 2023-03-13 DIAGNOSIS — I10 Essential (primary) hypertension: Secondary | ICD-10-CM | POA: Diagnosis not present

## 2023-03-13 DIAGNOSIS — I129 Hypertensive chronic kidney disease with stage 1 through stage 4 chronic kidney disease, or unspecified chronic kidney disease: Secondary | ICD-10-CM | POA: Diagnosis not present

## 2023-04-20 ENCOUNTER — Ambulatory Visit
Admission: RE | Admit: 2023-04-20 | Discharge: 2023-04-20 | Disposition: A | Discharge status: Home / Self Care | Payer: HMO | Source: Ambulatory Visit | Attending: Urology | Admitting: Urology

## 2023-04-20 DIAGNOSIS — N281 Cyst of kidney, acquired: Secondary | ICD-10-CM | POA: Insufficient documentation

## 2023-04-20 DIAGNOSIS — N2889 Other specified disorders of kidney and ureter: Secondary | ICD-10-CM | POA: Diagnosis not present

## 2023-04-25 DIAGNOSIS — C220 Liver cell carcinoma: Secondary | ICD-10-CM | POA: Diagnosis not present

## 2023-04-25 DIAGNOSIS — D333 Benign neoplasm of cranial nerves: Secondary | ICD-10-CM | POA: Diagnosis not present

## 2023-04-25 DIAGNOSIS — D329 Benign neoplasm of meninges, unspecified: Secondary | ICD-10-CM | POA: Diagnosis not present

## 2023-04-27 DIAGNOSIS — E782 Mixed hyperlipidemia: Secondary | ICD-10-CM | POA: Diagnosis not present

## 2023-04-27 DIAGNOSIS — E1143 Type 2 diabetes mellitus with diabetic autonomic (poly)neuropathy: Secondary | ICD-10-CM | POA: Diagnosis not present

## 2023-04-27 DIAGNOSIS — N1832 Chronic kidney disease, stage 3b: Secondary | ICD-10-CM | POA: Diagnosis not present

## 2023-05-05 ENCOUNTER — Telehealth: Payer: Self-pay

## 2023-05-05 ENCOUNTER — Other Ambulatory Visit: Payer: Self-pay | Admitting: *Deleted

## 2023-05-05 DIAGNOSIS — N2889 Other specified disorders of kidney and ureter: Secondary | ICD-10-CM

## 2023-05-05 NOTE — Telephone Encounter (Signed)
 Pt LM on triage line stating he missed a call and is calling back.  Called pt back no answer, left detailed message for pt informing him of u/s results per DPR. Mychart message sent as well.

## 2023-05-09 DIAGNOSIS — E1143 Type 2 diabetes mellitus with diabetic autonomic (poly)neuropathy: Secondary | ICD-10-CM | POA: Diagnosis not present

## 2023-05-09 DIAGNOSIS — N1832 Chronic kidney disease, stage 3b: Secondary | ICD-10-CM | POA: Diagnosis not present

## 2023-05-09 DIAGNOSIS — I10 Essential (primary) hypertension: Secondary | ICD-10-CM | POA: Diagnosis not present

## 2023-05-09 DIAGNOSIS — Z862 Personal history of diseases of the blood and blood-forming organs and certain disorders involving the immune mechanism: Secondary | ICD-10-CM | POA: Diagnosis not present

## 2023-05-09 DIAGNOSIS — E875 Hyperkalemia: Secondary | ICD-10-CM | POA: Diagnosis not present

## 2023-05-09 DIAGNOSIS — I5022 Chronic systolic (congestive) heart failure: Secondary | ICD-10-CM | POA: Diagnosis not present

## 2023-05-09 DIAGNOSIS — E782 Mixed hyperlipidemia: Secondary | ICD-10-CM | POA: Diagnosis not present

## 2023-06-26 ENCOUNTER — Ambulatory Visit
Admission: RE | Admit: 2023-06-26 | Discharge: 2023-06-26 | Disposition: A | Discharge status: Home / Self Care | Source: Ambulatory Visit | Attending: Urology | Admitting: Urology

## 2023-06-26 DIAGNOSIS — N2889 Other specified disorders of kidney and ureter: Secondary | ICD-10-CM | POA: Diagnosis not present

## 2023-06-26 DIAGNOSIS — I700 Atherosclerosis of aorta: Secondary | ICD-10-CM | POA: Diagnosis not present

## 2023-06-26 DIAGNOSIS — N281 Cyst of kidney, acquired: Secondary | ICD-10-CM | POA: Diagnosis not present

## 2023-06-26 DIAGNOSIS — K760 Fatty (change of) liver, not elsewhere classified: Secondary | ICD-10-CM | POA: Diagnosis not present

## 2023-06-26 MED ORDER — GADOBUTROL 1 MMOL/ML IV SOLN
9.0000 mL | Freq: Once | INTRAVENOUS | Status: AC | PRN
  Administered 2023-06-26: 9 mL via INTRAVENOUS

## 2023-07-01 ENCOUNTER — Encounter: Payer: Self-pay | Admitting: Urology

## 2023-07-03 ENCOUNTER — Other Ambulatory Visit: Payer: Self-pay | Admitting: *Deleted

## 2023-07-03 ENCOUNTER — Other Ambulatory Visit: Payer: Self-pay | Admitting: Urology

## 2023-07-03 ENCOUNTER — Other Ambulatory Visit: Payer: Self-pay | Admitting: Cardiovascular Disease

## 2023-07-03 DIAGNOSIS — N2889 Other specified disorders of kidney and ureter: Secondary | ICD-10-CM

## 2023-07-03 DIAGNOSIS — G459 Transient cerebral ischemic attack, unspecified: Secondary | ICD-10-CM

## 2023-07-04 NOTE — Telephone Encounter (Signed)
 Prescription refill request for Xarelto  received.  Indication:pad Last office visit:5/24 Weight:96.2  kg Age:83 Scr:1.8  3/25 CrCl:41.57  ml/min  Under review

## 2023-07-10 DIAGNOSIS — N189 Chronic kidney disease, unspecified: Secondary | ICD-10-CM | POA: Diagnosis not present

## 2023-07-10 DIAGNOSIS — R600 Localized edema: Secondary | ICD-10-CM | POA: Diagnosis not present

## 2023-07-10 DIAGNOSIS — E1122 Type 2 diabetes mellitus with diabetic chronic kidney disease: Secondary | ICD-10-CM | POA: Diagnosis not present

## 2023-07-10 DIAGNOSIS — N1832 Chronic kidney disease, stage 3b: Secondary | ICD-10-CM | POA: Diagnosis not present

## 2023-07-10 DIAGNOSIS — I129 Hypertensive chronic kidney disease with stage 1 through stage 4 chronic kidney disease, or unspecified chronic kidney disease: Secondary | ICD-10-CM | POA: Diagnosis not present

## 2023-07-10 DIAGNOSIS — N2889 Other specified disorders of kidney and ureter: Secondary | ICD-10-CM | POA: Diagnosis not present

## 2023-07-10 DIAGNOSIS — I10 Essential (primary) hypertension: Secondary | ICD-10-CM | POA: Diagnosis not present

## 2023-07-11 NOTE — Telephone Encounter (Signed)
 Reconsidering dose. Message sent to Dr. Gollan. "I know we are using Xarelto  off label for this guy for TIA so we dont really have guidance for dose. His CrCl is <50. typically for afib he would be dose reduced to 15. would you like the dose reduced? "

## 2023-07-27 ENCOUNTER — Other Ambulatory Visit: Payer: Self-pay | Admitting: Cardiovascular Disease

## 2023-08-07 DIAGNOSIS — E782 Mixed hyperlipidemia: Secondary | ICD-10-CM | POA: Diagnosis not present

## 2023-08-07 DIAGNOSIS — Z862 Personal history of diseases of the blood and blood-forming organs and certain disorders involving the immune mechanism: Secondary | ICD-10-CM | POA: Diagnosis not present

## 2023-08-07 DIAGNOSIS — E1143 Type 2 diabetes mellitus with diabetic autonomic (poly)neuropathy: Secondary | ICD-10-CM | POA: Diagnosis not present

## 2023-08-14 DIAGNOSIS — I10 Essential (primary) hypertension: Secondary | ICD-10-CM | POA: Diagnosis not present

## 2023-08-14 DIAGNOSIS — E1143 Type 2 diabetes mellitus with diabetic autonomic (poly)neuropathy: Secondary | ICD-10-CM | POA: Diagnosis not present

## 2023-08-14 DIAGNOSIS — N1832 Chronic kidney disease, stage 3b: Secondary | ICD-10-CM | POA: Diagnosis not present

## 2023-08-14 DIAGNOSIS — Z862 Personal history of diseases of the blood and blood-forming organs and certain disorders involving the immune mechanism: Secondary | ICD-10-CM | POA: Diagnosis not present

## 2023-08-14 DIAGNOSIS — E782 Mixed hyperlipidemia: Secondary | ICD-10-CM | POA: Diagnosis not present

## 2023-08-15 ENCOUNTER — Encounter: Payer: Self-pay | Admitting: Dermatology

## 2023-08-15 ENCOUNTER — Ambulatory Visit: Admitting: Dermatology

## 2023-08-15 DIAGNOSIS — L720 Epidermal cyst: Secondary | ICD-10-CM | POA: Diagnosis not present

## 2023-08-15 DIAGNOSIS — D485 Neoplasm of uncertain behavior of skin: Secondary | ICD-10-CM | POA: Diagnosis not present

## 2023-08-15 DIAGNOSIS — L723 Sebaceous cyst: Secondary | ICD-10-CM

## 2023-08-15 DIAGNOSIS — D492 Neoplasm of unspecified behavior of bone, soft tissue, and skin: Secondary | ICD-10-CM

## 2023-08-15 DIAGNOSIS — D489 Neoplasm of uncertain behavior, unspecified: Secondary | ICD-10-CM | POA: Diagnosis not present

## 2023-08-15 DIAGNOSIS — C4492 Squamous cell carcinoma of skin, unspecified: Secondary | ICD-10-CM

## 2023-08-15 HISTORY — DX: Squamous cell carcinoma of skin, unspecified: C44.92

## 2023-08-15 MED ORDER — DOXYCYCLINE MONOHYDRATE 100 MG PO CAPS: 100.0000 mg | ORAL_CAPSULE | Freq: Two times a day (BID) | ORAL | 0 refills | Status: AC

## 2023-08-15 NOTE — Progress Notes (Signed)
 New Patient Visit   Subjective  Kevin Choi is a 83 y.o. male who presents for the following: spot at back of neck, was small for about 6 months but over the last 2 weeks it has gotten larger and sore, hard to sleep. Also a spot at top of scalp that has been there for 1-2 years, not sore.   The patient has spots, moles and lesions to be evaluated, some may be new or changing and the patient may have concern these could be cancer.  Patient accompanied by wife.   The following portions of the chart were reviewed this encounter and updated as appropriate: medications, allergies, medical history  Review of Systems:  No other skin or systemic complaints except as noted in HPI or Assessment and Plan.  Objective  Well appearing patient in no apparent distress; mood and affect are within normal limits.   A focused examination was performed of the following areas: Scalp, neck  Relevant exam findings are noted in the Assessment and Plan.  Right Parietal Scalp 7 mm white indurated papule  Left Posterior Neck 3 cm erythematous subcutaneous nodule   Assessment & Plan    NEOPLASM OF UNCERTAIN BEHAVIOR OF SKIN (2) Right Parietal Scalp Epidermal / dermal shaving  Lesion diameter (cm):  0.7 Informed consent: discussed and consent obtained   Timeout: patient name, date of birth, surgical site, and procedure verified   Procedure prep:  Patient was prepped and draped in usual sterile fashion Prep type:  Isopropyl alcohol Anesthesia: the lesion was anesthetized in a standard fashion   Anesthetic:  1% lidocaine w/ epinephrine 1-100,000 buffered w/ 8.4% NaHCO3 Instrument used: DermaBlade   Hemostasis achieved with: pressure and aluminum chloride   Outcome: patient tolerated procedure well   Post-procedure details: wound care instructions given   Specimen 2 - Surgical pathology Differential Diagnosis: SCC vs other  Check Margins: No 2 pieces Left Posterior Neck Skin / nail  biopsy Type of biopsy: punch   Informed consent: discussed and consent obtained   Timeout: patient name, date of birth, surgical site, and procedure verified   Procedure prep:  Patient was prepped and draped in usual sterile fashion Prep type:  Isopropyl alcohol Anesthesia: the lesion was anesthetized in a standard fashion   Anesthetic:  1% lidocaine w/ epinephrine 1-100,000 buffered w/ 8.4% NaHCO3 Punch size:  4 mm Hemostasis achieved with: pressure and aluminum chloride   Outcome: patient tolerated procedure well   Post-procedure details: sterile dressing applied and wound care instructions given   Dressing type: bandage and petrolatum    Incision and Drainage Location: L posterior neck  Informed Consent: Discussed risks (permanent scarring, light or dark discoloration, infection, pain, bleeding, bruising, redness, damage to adjacent structures, and recurrence of the lesion) and benefits of the procedure, as well as the alternatives.  Informed consent was obtained.  Preparation: The area was prepped with alcohol.  Anesthesia: Lidocaine 1% with epinephrine  Procedure Details: An incision was made overlying the lesion. The lesion drained pus, white, chalky cyst material, and blood.  A large amount of fluid was drained.   The lesion was multiloculated.   Pieces of cyst wall were extracted.    Antibiotic ointment and a sterile pressure dressing were applied. The patient tolerated procedure well.  Total number of lesions drained: 1  Plan: The patient was instructed on post-op care. Recommend OTC analgesia as needed for pain.  Procedure took 40 minutes to complete due to extensive nature Specimen 1 -  Surgical pathology Differential Diagnosis: inflamed EIC vs other  Check Margins: No Start doxycycline 100 mg bid with food x 10 days.   Doxycycline should be taken with food to prevent nausea. Do not lay down for 30 minutes after taking. Be cautious with sun exposure and use good sun  protection while on this medication. Pregnant women should not take this medication.   Related Medications doxycycline (MONODOX) 100 MG capsule Take 1 capsule (100 mg total) by mouth 2 (two) times daily for 10 days. Doxycycline should be taken with food to prevent nausea. Do not lay down for 30 minutes after taking. Be cautious with sun exposure and use good sun protection while on this medication. INFLAMED EPIDERMOID CYST OF SKIN    Return in about 1 month (around 09/14/2023) for recheck cyst, with Dr. Claudene.  LILLETTE Lonell Drones, RMA, am acting as scribe for Boneta Claudene, MD .   Documentation: I have reviewed the above documentation for accuracy and completeness, and I agree with the above.  Boneta Claudene, MD

## 2023-08-15 NOTE — Patient Instructions (Signed)

## 2023-08-16 ENCOUNTER — Other Ambulatory Visit: Payer: Self-pay | Admitting: Cardiovascular Disease

## 2023-08-17 LAB — SURGICAL PATHOLOGY

## 2023-08-21 ENCOUNTER — Encounter: Payer: Self-pay | Admitting: Dermatology

## 2023-08-21 ENCOUNTER — Ambulatory Visit: Payer: Self-pay | Admitting: Dermatology

## 2023-08-21 DIAGNOSIS — C4492 Squamous cell carcinoma of skin, unspecified: Secondary | ICD-10-CM

## 2023-08-21 NOTE — Telephone Encounter (Signed)
-----   Message from [facility] sent at 08/21/2023  1:26 PM EDT ----- Diagnosis: 1. Skin, left posterior neck :       EPIDERMOID CYST        2. Skin, right parietal scalp :       ATYPICAL SQUAMOUS PROLIFERATION WITH CYSTIC CHANGE, SEE DESCRIPTION   Please call   LEFT NECK Biopsy shows a benign cyst as expected. If removal is desired, let us  know and we will refer to Dr. Corey in [place] for excision  2. RIGHT SCALP Biopsy shows is a squamous cell skin cancer that has grown beyond the surface of the skin and is invading the second layer of the skin. It has the potential to spread beyond the skin and threaten  your health, so I recommend treating it.  Treatment: Given the location and type of skin cancer, I recommend Mohs surgery. Mohs surgery involves cutting out the skin cancer and then checking under the microscope to ensure the whole skin  cancer was removed. If any skin cancer remains, the surgeon will cut out more until it is fully removed. The cure rate is about 98-99%. Once the Mohs surgeon confirms the skin cancer is out, they  will discuss the options to repair or heal the area. You must take it easy for about two weeks after surgery (no lifting over 10-15 lbs, avoid activity to get your heart rate and blood pressure up).  It is done at another office outside of [place] ([place], [place], or [place]). ----- Message ----- From: Interface, Lab In Three Zero One Sent: 08/17/2023   3:12 PM EDT To: Boneta Sharps, MD

## 2023-08-21 NOTE — Telephone Encounter (Signed)
 Patient advised pathology results showed SCC, referral sent to Dr. Corey for Mohs. Lonell RAMAN., RMA

## 2023-09-09 ENCOUNTER — Other Ambulatory Visit: Payer: Self-pay | Admitting: Cardiovascular Disease

## 2023-09-13 ENCOUNTER — Encounter: Payer: Self-pay | Admitting: Dermatology

## 2023-09-14 ENCOUNTER — Ambulatory Visit: Admitting: Dermatology

## 2023-09-14 ENCOUNTER — Encounter: Payer: Self-pay | Admitting: Dermatology

## 2023-09-14 DIAGNOSIS — L570 Actinic keratosis: Secondary | ICD-10-CM | POA: Diagnosis not present

## 2023-09-14 DIAGNOSIS — C4492 Squamous cell carcinoma of skin, unspecified: Secondary | ICD-10-CM

## 2023-09-14 DIAGNOSIS — D492 Neoplasm of unspecified behavior of bone, soft tissue, and skin: Secondary | ICD-10-CM

## 2023-09-14 DIAGNOSIS — C4442 Squamous cell carcinoma of skin of scalp and neck: Secondary | ICD-10-CM

## 2023-09-14 DIAGNOSIS — L720 Epidermal cyst: Secondary | ICD-10-CM

## 2023-09-14 DIAGNOSIS — W908XXA Exposure to other nonionizing radiation, initial encounter: Secondary | ICD-10-CM | POA: Diagnosis not present

## 2023-09-14 HISTORY — DX: Squamous cell carcinoma of skin, unspecified: C44.92

## 2023-09-14 NOTE — Patient Instructions (Signed)
 Keep scheduled appointment for Mohs with Dr. Corey     Wound Care Instructions  Cleanse wound gently with soap and water once a day then pat dry with clean gauze. Apply a thin coat of Petrolatum (petroleum jelly, Vaseline) over the wound (unless you have an allergy to this). We recommend that you use a new, sterile tube of Vaseline. Do not pick or remove scabs. Do not remove the yellow or white healing tissue from the base of the wound.  Cover the wound with fresh, clean, nonstick gauze and secure with paper tape. You may use Band-Aids in place of gauze and tape if the wound is small enough, but would recommend trimming much of the tape off as there is often too much. Sometimes Band-Aids can irritate the skin.  You should call the office for your biopsy report after 1 week if you have not already been contacted.  If you experience any problems, such as abnormal amounts of bleeding, swelling, significant bruising, significant pain, or evidence of infection, please call the office immediately.  FOR ADULT SURGERY PATIENTS: If you need something for pain relief you may take 1 extra strength Tylenol  (acetaminophen ) AND 2 Ibuprofen (200mg  each) together every 4 hours as needed for pain. (do not take these if you are allergic to them or if you have a reason you should not take them.) Typically, you may only need pain medication for 1 to 3 days.        Due to recent changes in healthcare laws, you may see results of your pathology and/or laboratory studies on MyChart before the doctors have had a chance to review them. We understand that in some cases there may be results that are confusing or concerning to you. Please understand that not all results are received at the same time and often the doctors may need to interpret multiple results in order to provide you with the best plan of care or course of treatment. Therefore, we ask that you please give us  2 business days to thoroughly review all your  results before contacting the office for clarification. Should we see a critical lab result, you will be contacted sooner.   If You Need Anything After Your Visit  If you have any questions or concerns for your doctor, please call our main line at [phone] and press option 4 to reach your doctor's medical assistant. If no one answers, please leave a voicemail as directed and we will return your call as soon as possible. Messages left after 4 pm will be answered the following business day.   You may also send us  a message via MyChart. We typically respond to MyChart messages within 1-2 business days.  For prescription refills, please ask your pharmacy to contact our office. Our fax number is [phone].  If you have an urgent issue when the clinic is closed that cannot wait until the next business day, you can page your doctor at the number below.    Please note that while we do our best to be available for urgent issues outside of office hours, we are not available 24/7.   If you have an urgent issue and are unable to reach us , you may choose to seek medical care at your doctor's office, retail clinic, urgent care center, or emergency room.  If you have a medical emergency, please immediately call 911 or go to the emergency department.  Pager Numbers  - Dr. Hester: [phone]  - Dr. Jackquline: [phone]  - Dr.  Claudene: [phone]   In the event of inclement weather, please call our main line at [phone] for an update on the status of any delays or closures.  Dermatology Medication Tips: Please keep the boxes that topical medications come in in order to help keep track of the instructions about where and how to use these. Pharmacies typically print the medication instructions only on the boxes and not directly on the medication tubes.   If your medication is too expensive, please contact our office at [phone] option 4 or send us  a message through MyChart.   We are  unable to tell what your co-pay for medications will be in advance as this is different depending on your insurance coverage. However, we may be able to find a substitute medication at lower cost or fill out paperwork to get insurance to cover a needed medication.   If a prior authorization is required to get your medication covered by your insurance company, please allow us  1-2 business days to complete this process.  Drug prices often vary depending on where the prescription is filled and some pharmacies may offer cheaper prices.  The website www.goodrx.com contains coupons for medications through different pharmacies. The prices here do not account for what the cost may be with help from insurance (it may be cheaper with your insurance), but the website can give you the price if you did not use any insurance.  - You can print the associated coupon and take it with your prescription to the pharmacy.  - You may also stop by our office during regular business hours and pick up a GoodRx coupon card.  - If you need your prescription sent electronically to a different pharmacy, notify our office through [facility] or by phone at [phone] option 4.     Si Usted Necesita Algo Despus de Su Visita  Tambin puede enviarnos un mensaje a travs de Clinical cytogeneticist. Por lo general respondemos a los mensajes de MyChart en el transcurso de 1 a 2 das hbiles.  Para renovar recetas, por favor pida a su farmacia que se ponga en contacto con nuestra oficina. Randi lakes de fax es [place] [phone].  Si tiene un asunto urgente cuando la clnica est cerrada y que no puede esperar hasta el siguiente da hbil, puede llamar/localizar a su doctor(a) al nmero que aparece a continuacin.   Por favor, tenga en cuenta que aunque hacemos todo lo posible para estar disponibles para asuntos urgentes fuera del horario de [place], no estamos disponibles las 24 horas del da, los 7 [address]  [address] de la [place].   Si tiene un  problema urgente y no puede comunicarse con nosotros, puede optar por buscar atencin mdica  en el consultorio de su doctor(a), en una clnica privada, en un centro de atencin urgente o en una sala de emergencias.  Si tiene Engineer, drilling, por favor llame inmediatamente al 911 o vaya a la sala de emergencias.  Nmeros de bper  - Dr. Hester: [phone]  - Dra. Jackquline: [phone]  - Dr. Claudene: [phone]   En caso de inclemencias del tiempo, por favor llame a landry capes principal al [phone] para una actualizacin sobre el [place] de cualquier retraso o cierre.  Consejos para la medicacin en dermatologa: Por favor, guarde las cajas en las que vienen los medicamentos de uso tpico para ayudarle a seguir las instrucciones sobre dnde y cmo usarlos. Las farmacias generalmente imprimen las instrucciones del medicamento slo en las cajas y no directamente en los  tubos del medicamento.   Si su medicamento es muy caro, por favor, pngase en contacto con landry rieger llamando al [phone] y presione la opcin 4 o envenos un mensaje a travs de Clinical cytogeneticist.   No podemos decirle cul ser su copago por los medicamentos por adelantado ya que esto es diferente dependiendo de la cobertura de su seguro. Sin embargo, es posible que podamos encontrar un medicamento sustituto a Audiological scientist un formulario para que el seguro cubra el medicamento que se considera necesario.   Si se requiere una autorizacin previa para que su compaa de seguros malta su medicamento, por favor permtanos de 1 a 2 das hbiles para completar este proceso.  Los precios de los medicamentos varan con frecuencia dependiendo del Environmental consultant de dnde se surte la receta y alguna farmacias pueden ofrecer precios ms baratos.  El sitio web www.goodrx.com tiene cupones para medicamentos de Health and safety inspector. Los precios aqu no tienen en cuenta lo que podra costar con la ayuda del seguro (puede ser ms  barato con su seguro), pero el sitio web puede darle el precio si no utiliz Tourist information centre manager.  - Puede imprimir el cupn correspondiente y llevarlo con su receta a la farmacia.  - Tambin puede pasar por nuestra oficina durante el horario de atencin regular y Education officer, museum una tarjeta de cupones de GoodRx.  - Si necesita que su receta se enve electrnicamente a una farmacia diferente, informe a nuestra oficina a travs de MyChart de [place] o por telfono llamando al [phone] y presione la opcin 4.

## 2023-09-14 NOTE — Progress Notes (Signed)
 Follow-Up Visit   Subjective  Kevin Choi is a 83 y.o. male who presents for the following: 1 month cyst recheck. Left posterior neck. I&D performed 08/15/2023 (40 minute procedure to to extensive nature). Doxycycline  100 mg twice a day for 10 days prescribed.     The following portions of the chart were reviewed this encounter and updated as appropriate: medications, allergies, medical history  Review of Systems:  No other skin or systemic complaints except as noted in HPI or Assessment and Plan.  Objective  Well appearing patient in no apparent distress; mood and affect are within normal limits.  A focused examination was performed of the following areas: Neck   Relevant physical exam findings are noted in the Assessment and Plan.  Left Parietal Scalp 6 mm pink keratotic papule  Central Frontal Scalp x1 Erythematous thin papules/macules with gritty scale.   Assessment & Plan   EPIDERMAL INCLUSION CYST, previously treated with I&D/Doxycycline   Exam: open incision at I&D site at L posterior neck, small amount of hemorrhagic crusting  Benign-appearing. Exam most consistent with an epidermal inclusion cyst. Discussed that a cyst is a benign growth that can grow over time and sometimes get irritated or inflamed. Recommend observation if it is not bothersome. Discussed option of surgical excision to remove it if it is growing, symptomatic, or other changes noted. Please call for new or changing lesions so they can be evaluated.   Atypical squamous proliferation with cystic change  Exam: erythematous lesion with hemorrhagic crusting at right parietal scalp  Treatment:  Keep scheduled appointment for Mohs with Dr. Corey  NEOPLASM OF SKIN Left Parietal Scalp Skin / nail biopsy Type of biopsy: tangential   Informed consent: discussed and consent obtained   Timeout: patient name, date of birth, surgical site, and procedure verified   Procedure prep:  Patient was prepped and  draped in usual sterile fashion Prep type:  Isopropyl alcohol Anesthesia: the lesion was anesthetized in a standard fashion   Anesthetic:  1% lidocaine w/ epinephrine 1-100,000 buffered w/ 8.4% NaHCO3 Instrument used: DermaBlade   Hemostasis achieved with: pressure and aluminum chloride   Outcome: patient tolerated procedure well   Post-procedure details: sterile dressing applied and wound care instructions given   Dressing type: bandage and petrolatum    Specimen 1 - Surgical pathology Differential Diagnosis: R/O SCC vs ISK  Check Margins: No AK (ACTINIC KERATOSIS) Central Frontal Scalp x1 Actinic keratoses are precancerous spots that appear secondary to cumulative UV radiation exposure/sun exposure over time. They are chronic with expected duration over 1 year. A portion of actinic keratoses will progress to squamous cell carcinoma of the skin. It is not possible to reliably predict which spots will progress to skin cancer and so treatment is recommended to prevent development of skin cancer.  Recommend daily broad spectrum sunscreen SPF 30+ to sun-exposed areas, reapply every 2 hours as needed.  Recommend staying in the shade or wearing long sleeves, sun glasses (UVA+UVB protection) and wide brim hats (4-inch brim around the entire circumference of the hat). Call for new or changing lesions. Destruction of lesion - Central Frontal Scalp x1 Complexity: simple   Destruction method: cryotherapy   Informed consent: discussed and consent obtained   Timeout:  patient name, date of birth, surgical site, and procedure verified Lesion destroyed using liquid nitrogen: Yes   Region frozen until ice ball extended beyond lesion: Yes   Cryo cycles: 1 or 2. Outcome: patient tolerated procedure well with no complications  Post-procedure details: wound care instructions given      Return for TBSE, Next Available (within next 6 months).  I, Jill Parcell, CMA, am acting as scribe for  Boneta Sharps, MD.   Documentation: I have reviewed the above documentation for accuracy and completeness, and I agree with the above.  Boneta Sharps, MD

## 2023-09-19 ENCOUNTER — Encounter: Payer: Self-pay | Admitting: Dermatology

## 2023-09-19 ENCOUNTER — Ambulatory Visit: Admitting: Dermatology

## 2023-09-19 ENCOUNTER — Ambulatory Visit: Payer: Self-pay | Admitting: Dermatology

## 2023-09-19 VITALS — BP 148/75 | HR 73 | Temp 97.9°F

## 2023-09-19 DIAGNOSIS — C4492 Squamous cell carcinoma of skin, unspecified: Secondary | ICD-10-CM

## 2023-09-19 DIAGNOSIS — C4442 Squamous cell carcinoma of skin of scalp and neck: Secondary | ICD-10-CM

## 2023-09-19 DIAGNOSIS — L579 Skin changes due to chronic exposure to nonionizing radiation, unspecified: Secondary | ICD-10-CM

## 2023-09-19 DIAGNOSIS — L814 Other melanin hyperpigmentation: Secondary | ICD-10-CM | POA: Diagnosis not present

## 2023-09-19 LAB — SURGICAL PATHOLOGY

## 2023-09-19 MED ORDER — TRAMADOL HCL 50 MG PO TABS: 50.0000 mg | ORAL_TABLET | Freq: Four times a day (QID) | ORAL | 0 refills | Status: DC | PRN

## 2023-09-19 NOTE — Patient Instructions (Signed)

## 2023-09-19 NOTE — Telephone Encounter (Signed)
 Called patient. N/A. LMOVM for patient to C/B for pathology results and treatment plan.

## 2023-09-19 NOTE — Telephone Encounter (Signed)
-----   Message from [place] sent at 09/19/2023  5:50 PM EDT ----- Diagnosis: left parietal scalp :       WELL DIFFERENTIATED SQUAMOUS CELL CARCINOMA   Please call with diagnosis and refer to Mohs surgery.  Explanation: invasive SCC  Treatment: Mohs Dr Corey ----- Message ----- From: Interface, Lab In Three Zero Seven Sent: 09/19/2023   5:24 PM EDT To: Boneta Sharps, MD

## 2023-09-19 NOTE — Progress Notes (Signed)
 Follow-Up Visit   Subjective  Kevin Choi is a 83 y.o. male who presents for the following: Mohs of an Atypical Squamous Proliferation of the right parietal scalp, referred by Dr. Claudene.  Lesion has been present for 18 months and 2 years. Negative for bleeding or previous treatment.   The following portions of the chart were reviewed this encounter and updated as appropriate: medications, allergies, medical history  Review of Systems:  No other skin or systemic complaints except as noted in HPI or Assessment and Plan.  Objective  Well appearing patient in no apparent distress; mood and affect are within normal limits.  A focused examination was performed of the following areas: Right parietal scalp Relevant physical exam findings are noted in the Assessment and Plan.   Right Parietal Scalp Healing biopsies site with subcutaneous nodule   Assessment & Plan   SQUAMOUS CELL CARCINOMA OF SKIN Right Parietal Scalp Mohs surgery  Consent obtained: written  Anticoagulation: Is the patient taking prescription anticoagulant and/or aspirin prescribed/recommended by a physician? Yes   Was the anticoagulation regimen changed prior to Mohs? No    Anesthesia: Anesthesia method: local infiltration Local anesthetic: lidocaine 1% WITH epi  Procedure Details: Timeout: pre-procedure verification complete Procedure Prep: patient was prepped and draped in usual sterile fashion Prep type: chlorhexidine Biopsy accession number: [id number] Biopsy lab: [facility] Date of biopsy: 08/15/2023 Frozen section biopsy performed: No   Specimen debulked: Yes   Pre-Op diagnosis: squamous cell carcinoma MohsAIQ Surgical site (if tumor spans multiple areas, please select predominant area): scalp Surgery side: right Surgical site (from skin exam): Right Parietal Scalp Pre-operative length (cm): 1.1 Pre-operative width (cm): 1 Indications for Mohs surgery: anatomic location where tissue  conservation is critical Previously treated? No    Micrographic Surgery Details: Post-operative length (cm): 1.8 Post-operative width (cm): 1.9 Number of Mohs stages: 2 Post surgery depth of defect: skeletal muscle  Stage 1    Tumor features identified on Mohs section: squamous cell carcinoma  Stage 2    Tumor features identified on Mohs section: no tumor identified  Patient tolerance of procedure: tolerated well, no immediate complications  Reconstruction: Was the defect reconstructed? Yes   Was reconstruction performed by the same Mohs surgeon? Yes   Setting of reconstruction: outpatient office When was reconstruction performed? same day Type of reconstruction: purse string  Opioids: Did the patient receive a prescription for opioid/narcotic related to Mohs surgery? Yes   Indications for opioid/narcotics: patient required additional pain relief despite trial of non-opioid analgesia  Antibiotics: Does patient meet AHA guidelines for endocarditis?: No   Does patient meet AHA guidelines for orthopedic prophylaxis?: No   Were antibiotics given on the day of surgery?: No   Did surgery breach mucosa, expose cartilage/bone, involve an area of lymphedema/inflamed/infected tissue? No    Skin repair Complexity:  Simple Final length (cm):  1.1 Informed consent: discussed and consent obtained   Timeout: patient name, date of birth, surgical site, and procedure verified   Procedure prep:  Patient was prepped and draped in usual sterile fashion Prep type:  Chlorhexidine Anesthesia: the lesion was anesthetized in a standard fashion   Anesthetic:  1% lidocaine w/ epinephrine 1-100,000 buffered w/ 8.4% NaHCO3 Fine/surface layer approximation (top stitches):  Suture size:  3-0 Suture type: Vicryl (polyglactin 910)   Hemostasis achieved with: electrodesiccation Outcome: patient tolerated procedure well with no complications   Post-procedure details: sterile dressing applied and wound  care instructions given   Dressing type: pressure  dressing (GelFoam)    Related Medications traMADol  (ULTRAM ) 50 MG tablet Take 1 tablet (50 mg total) by mouth every 6 (six) hours as needed for up to 8 doses.   Return in about 4 weeks (around 10/17/2023) for wound check.  LILLETTE Rollene Gobble, RN, am acting as scribe for RUFUS CHRISTELLA HOLY, MD .   09/19/2023  HISTORY OF PRESENT ILLNESS  Kevin Choi is seen in consultation at the request of Dr. Claudene for biopsy-proven Atypical Squamous Proliferation (cannot rule out SCC). They note that the area has been present for about 2 years increasing in size with time.  There is no history of previous treatment.  Reports no other new or changing lesions and has no other complaints today.  Medications and allergies: see patient chart.  Review of systems: Reviewed 8 systems and notable for the above skin cancer.  All other systems reviewed are unremarkable/negative, unless noted in the HPI. Past medical history, surgical history, family history, social history were also reviewed and are noted in the chart/questionnaire.    PHYSICAL EXAMINATION  General: Well-appearing, in no acute distress, alert and oriented x 4. Vitals reviewed in chart (if available).   Skin: Exam reveals a 1.1 x 1.0 cm erythematous papule and biopsy scar on the scalp. There are rhytids, telangiectasias, and lentigines, consistent with photodamage.  Biopsy report(s) reviewed, confirming the diagnosis.   ASSESSMENT  1) Atypical Squamous Proliferation (Cannot Rule Out SCC) on the scalp 2) photodamage 3) solar lentigines   PLAN   1. Due to location, size, histology, or recurrence and the likelihood of subclinical extension as well as the need to conserve normal surrounding tissue, the patient was deemed acceptable for Mohs micrographic surgery (MMS).  The nature and purpose of the procedure, associated benefits and risks including recurrence and scarring, possible complications  such as pain, infection, and bleeding, and alternative methods of treatment if appropriate were discussed with the patient during consent. The lesion location was verified by the patient, by reviewing previous notes, pathology reports, and by photographs as well as angulation measurements if available.  Informed consent was reviewed and signed by the patient, and timeout was performed at 10:00 AM. See op note below.  2. For the photodamage and solar lentigines, sun protection discussed/information given on OTC sunscreens, and we recommend continued regular follow-up with primary dermatologist every 6 months or sooner for any growing, bleeding, or changing lesions. 3. Prognosis and future surveillance discussed. 4. Letter with treatment outcome sent to referring provider. 5. Pain acetaminophen   MOHS MICROGRAPHIC SURGERY AND RECONSTRUCTION  Initial size:   1.1 x 1.0 cm Surgical defect/wound size: 1.8 x 1.9 cm Anesthesia:    0.33% lidocaine with 1:200,000 epinephrine EBL:    <5 mL Complications:  None Repair type:   Simple SQ suture:   3-0 Vicryl Final size of the repair: 1.1 x 1.0 cm  Stages: 2  STAGE I: Anesthesia achieved with 0.5% lidocaine with 1:200,000 epinephrine. ChloraPrep applied. 2 section(s) excised using Mohs technique (this includes total peripheral and deep tissue margin excision and evaluation with frozen sections, excised and interpreted by the same physician). The tumor was first debulked and then excised with an approx. 2 mm margin.  Hemostasis was achieved with electrocautery as needed.  The specimen was then oriented, subdivided/relaxed, inked, and processed using Mohs technique.    Frozen section analysis revealed a positive margin for dense inflammatory cells and epithelium with squamous differentiation in the deep margin.    STAGE II: An  additional 2 mm margin was excised.  Hemostasis was achieved with electrocautery as needed.  The specimen was then oriented,  subdivided/relaxed, inked, and processed using Mohs technique. Evaluation of slides by the Mohs surgeon revealed clear tumor margins.   Reconstruction  The surgical wound was then cleaned, prepped, and re-anesthetized as above. Hemostasis was achieved with electrocautery. Subcutaneous and epidermal tissues were approximated with the above sutures using guiding sutures/purse-string technique. The surgical site was then lightly scrubbed with sterile, saline-soaked gauze. The area was then bandaged using Vaseline ointment, non-adherent gauze, gauze pads, and tape to provide an adequate pressure dressing. The patient tolerated the procedure well, was given detailed written and verbal wound care instructions, and was discharged in good condition.   The patient will follow-up: 4 weeks.    Documentation: I have reviewed the above documentation for accuracy and completeness, and I agree with the above.  RUFUS CHRISTELLA HOLY, MD

## 2023-09-21 ENCOUNTER — Encounter: Payer: Self-pay | Admitting: Dermatology

## 2023-09-21 NOTE — Telephone Encounter (Signed)
-----   Message from [place] sent at 09/19/2023  5:50 PM EDT ----- Diagnosis: left parietal scalp :       WELL DIFFERENTIATED SQUAMOUS CELL CARCINOMA   Please call with diagnosis and refer to Mohs surgery.  Explanation: invasive SCC  Treatment: Mohs Dr Corey ----- Message ----- From: Interface, Lab In Three Zero Seven Sent: 09/19/2023   5:24 PM EDT To: Boneta Sharps, MD

## 2023-09-21 NOTE — Addendum Note (Signed)
 Addended by: Dejour Vos I on: 09/21/2023 01:21 PM   Modules accepted: Orders

## 2023-09-21 NOTE — Telephone Encounter (Signed)
 Spoke with patient and advised pathology showed SCC, needs Mohs. Referral sent to Dr. Corey. Lonell RAMAN., RMA

## 2023-10-12 ENCOUNTER — Emergency Department

## 2023-10-12 ENCOUNTER — Emergency Department
Admission: EM | Admit: 2023-10-12 | Discharge: 2023-10-13 | Disposition: A | Discharge status: Home / Self Care | Attending: Emergency Medicine | Admitting: Emergency Medicine

## 2023-10-12 ENCOUNTER — Other Ambulatory Visit: Payer: Self-pay

## 2023-10-12 ENCOUNTER — Encounter: Payer: Self-pay | Admitting: Dermatology

## 2023-10-12 ENCOUNTER — Encounter: Payer: Self-pay | Admitting: Emergency Medicine

## 2023-10-12 DIAGNOSIS — R519 Headache, unspecified: Secondary | ICD-10-CM | POA: Insufficient documentation

## 2023-10-12 DIAGNOSIS — R440 Auditory hallucinations: Secondary | ICD-10-CM | POA: Insufficient documentation

## 2023-10-12 DIAGNOSIS — I10 Essential (primary) hypertension: Secondary | ICD-10-CM | POA: Diagnosis not present

## 2023-10-12 DIAGNOSIS — R442 Other hallucinations: Secondary | ICD-10-CM | POA: Diagnosis not present

## 2023-10-12 DIAGNOSIS — G9389 Other specified disorders of brain: Secondary | ICD-10-CM | POA: Diagnosis not present

## 2023-10-12 DIAGNOSIS — I6782 Cerebral ischemia: Secondary | ICD-10-CM | POA: Diagnosis not present

## 2023-10-12 DIAGNOSIS — R443 Hallucinations, unspecified: Secondary | ICD-10-CM | POA: Diagnosis not present

## 2023-10-12 DIAGNOSIS — D496 Neoplasm of unspecified behavior of brain: Secondary | ICD-10-CM | POA: Diagnosis not present

## 2023-10-12 DIAGNOSIS — R823 Hemoglobinuria: Secondary | ICD-10-CM | POA: Insufficient documentation

## 2023-10-12 DIAGNOSIS — R220 Localized swelling, mass and lump, head: Secondary | ICD-10-CM | POA: Diagnosis not present

## 2023-10-12 DIAGNOSIS — R441 Visual hallucinations: Secondary | ICD-10-CM | POA: Diagnosis not present

## 2023-10-12 DIAGNOSIS — Z79899 Other long term (current) drug therapy: Secondary | ICD-10-CM | POA: Insufficient documentation

## 2023-10-12 DIAGNOSIS — I447 Left bundle-branch block, unspecified: Secondary | ICD-10-CM | POA: Diagnosis not present

## 2023-10-12 LAB — CBC WITH DIFFERENTIAL/PLATELET
Abs Immature Granulocytes: 0.02 K/uL (ref 0.00–0.07)
Basophils Absolute: 0 K/uL (ref 0.0–0.1)
Basophils Relative: 1 %
Eosinophils Absolute: 0.1 K/uL (ref 0.0–0.5)
Eosinophils Relative: 1 %
HCT: 38.6 % — ABNORMAL LOW (ref 39.0–52.0)
Hemoglobin: 12.3 g/dL — ABNORMAL LOW (ref 13.0–17.0)
Immature Granulocytes: 0 %
Lymphocytes Relative: 38 %
Lymphs Abs: 2.1 K/uL (ref 0.7–4.0)
MCH: 28.4 pg (ref 26.0–34.0)
MCHC: 31.9 g/dL (ref 30.0–36.0)
MCV: 89.1 fL (ref 80.0–100.0)
Monocytes Absolute: 0.5 K/uL (ref 0.1–1.0)
Monocytes Relative: 9 %
Neutro Abs: 2.8 K/uL (ref 1.7–7.7)
Neutrophils Relative %: 51 %
Platelets: 328 K/uL (ref 150–400)
RBC: 4.33 MIL/uL (ref 4.22–5.81)
RDW: 14.6 % (ref 11.5–15.5)
WBC: 5.5 K/uL (ref 4.0–10.5)
nRBC: 0 % (ref 0.0–0.2)

## 2023-10-12 LAB — URINALYSIS, ROUTINE W REFLEX MICROSCOPIC
Bacteria, UA: NONE SEEN
Bilirubin Urine: NEGATIVE
Glucose, UA: 50 mg/dL — AB
Ketones, ur: NEGATIVE mg/dL
Leukocytes,Ua: NEGATIVE
Nitrite: NEGATIVE
Protein, ur: 30 mg/dL — AB
Specific Gravity, Urine: 1.026 (ref 1.005–1.030)
pH: 5 (ref 5.0–8.0)

## 2023-10-12 LAB — COMPREHENSIVE METABOLIC PANEL WITH GFR
ALT: 22 U/L (ref 0–44)
AST: 36 U/L (ref 15–41)
Albumin: 3.7 g/dL (ref 3.5–5.0)
Alkaline Phosphatase: 45 U/L (ref 38–126)
Anion gap: 13 (ref 5–15)
BUN: 19 mg/dL (ref 8–23)
CO2: 19 mmol/L — ABNORMAL LOW (ref 22–32)
Calcium: 9.2 mg/dL (ref 8.9–10.3)
Chloride: 107 mmol/L (ref 98–111)
Creatinine, Ser: 1.4 mg/dL — ABNORMAL HIGH (ref 0.61–1.24)
GFR, Estimated: 50 mL/min — ABNORMAL LOW (ref >60)
Glucose, Bld: 138 mg/dL — ABNORMAL HIGH (ref 70–99)
Potassium: 4.3 mmol/L (ref 3.5–5.1)
Sodium: 139 mmol/L (ref 135–145)
Total Bilirubin: 1.2 mg/dL (ref 0.0–1.2)
Total Protein: 7.1 g/dL (ref 6.5–8.1)

## 2023-10-12 NOTE — ED Provider Notes (Signed)
 [facility] Provider Note    Event Date/Time   First MD Initiated Contact with Patient 10/12/23 2327     (approximate)   History   Headache   HPI Kevin Choi is a 83 y.o. male who presents for evaluation of headaches and some hallucinations that are primarily auditory.  His adult daughter is with him at bedside to provide additional history.  The patient has a history of meningioma that was resected.  When he was first diagnosed with it, he had similar symptoms of hallucinations.  They resolved after the resection, but in 2023 he was again diagnosed with a meningioma that is just being watched for now.  About a week ago he started having persistent headaches and was started on some tramadol  by his primary care provider, Dr. Alla.  This did not seem to help and the patient started having more and more frequent and more vivid hallucinations.  He believes that there are children that are coming into his house that were not invited, and the symptoms seem to be worse at night because they are affecting his ability to sleep.  He even made the comment they have maybe the kids are in school during the day [so they only come over at night].  Other than the tramadol , it does not sound as if he is on any new medications.  He has had no burning when he urinates.  No recent fever, chest pain, shortness of breath, nausea, vomiting, nor abdominal pain.  No focal weakness or numbness.  No signs or symptoms of allergies or sinus infection.     Physical Exam   Triage Vital Signs: ED Triage Vitals  Encounter Vitals Group     BP 10/12/23 1958 (!) 175/70     Girls Systolic BP Percentile --      Girls Diastolic BP Percentile --      Boys Systolic BP Percentile --      Boys Diastolic BP Percentile --      Pulse Rate 10/12/23 1958 89     Resp 10/12/23 1958 20     Temp 10/12/23 1958 98 F (36.7 C)     Temp Source 10/12/23 1957 Oral     SpO2 10/12/23 1958 99 %      Weight 10/12/23 1958 97.1 kg (214 lb 1.1 oz)     Height 10/12/23 1958 1.727 m (5' 8)     Head Circumference --      Peak Flow --      Pain Score 10/12/23 2002 6     Pain Loc --      Pain Education --      Exclude from Growth Chart --     Most recent vital signs: Vitals:   10/12/23 2346 10/13/23 0000  BP: (!) 175/76 (!) 173/77  Pulse: 65 69  Resp: (!) 21   Temp:    SpO2: 98% 99%    General: Awake, no distress.  Conversant, in good spirits. CV:  Good peripheral perfusion.  Regular rate and rhythm, normal heart sounds. Resp:  Normal effort. Speaking easily and comfortably, no accessory muscle usage nor intercostal retractions.  Lungs are clear to auscultation bilaterally. Abd:  No distention.  No tenderness to palpation of the abdomen Other:  GCS of 15, no appreciable focal neurological deficits, including no aphasia nor dysarthria.  However, the more I speak with the patient, the more it is clear that he believes the hallucinations he is experiencing are real, or  at least they seem so real to him that it is hard for him to differentiate.   ED Results / Procedures / Treatments   Labs (all labs ordered are listed, but only abnormal results are displayed) Labs Reviewed  CBC WITH DIFFERENTIAL/PLATELET - Abnormal; Notable for the following components:      Result Value   Hemoglobin 12.3 (*)    HCT 38.6 (*)    All other components within normal limits  COMPREHENSIVE METABOLIC PANEL WITH GFR - Abnormal; Notable for the following components:   CO2 19 (*)    Glucose, Bld 138 (*)    Creatinine, Ser 1.40 (*)    GFR, Estimated 50 (*)    All other components within normal limits  URINALYSIS, ROUTINE W REFLEX MICROSCOPIC - Abnormal; Notable for the following components:   Color, Urine YELLOW (*)    APPearance CLEAR (*)    Glucose, UA 50 (*)    Hgb urine dipstick SMALL (*)    Protein, ur 30 (*)    All other components within normal limits  URINE CULTURE     EKG  ED ECG  REPORT I, Darleene Dome, the attending physician, personally viewed and interpreted this ECG.  Date: 10/12/2023 EKG Time: 20:03 Rate: 68 Rhythm: normal sinus rhythm QRS Axis: normal Intervals: normal ST/T Wave abnormalities: Non-specific ST segment / T-wave changes, but no clear evidence of acute ischemia. Narrative Interpretation: no definitive evidence of acute ischemia; does not meet STEMI criteria.    RADIOLOGY I independently viewed and interpreted the patient's CT head.  I can appreciate an abnormality along the right lateral pons.  However, the radiologist clarified that this is unchanged in size since the last MRI in 2023.  No evidence of acute hemorrhage.  Radiologist also commented on the question of acute sinusitis.   PROCEDURES:  Critical Care performed: No  Procedures    IMPRESSION / MDM / ASSESSMENT AND PLAN / ED COURSE  I reviewed the triage vital signs and the nursing notes.                              Differential diagnosis includes, but is not limited to, dementia such as Lewy body disease, acute metabolic encephalopathy, brain tumor, CVA, acute infectious process such as UTI.  Patient's presentation is most consistent with acute presentation with potential threat to life or bodily function.  Labs/studies ordered: EKG, CT head, CBC with differential, CMP, urinalysis, urine culture  Interventions/Medications given:  Medications - No data to display  (Note:  hospital course my include additional interventions and/or labs/studies not listed above.)   Patient's vital signs are notable for some hypertension but generally appropriate for the circumstances.  Labs are all at baseline for him including his creatinine of 1.4.  CO2 is slightly low at 19 but he was a bit tachypneic when he first came in and he may have blown off some of the CO2.  CBC is essentially normal.  Urinalysis shows a small amount of hemoglobinuria but no evidence of infection but I ordered a  urine culture just in case.  Head CT is stable from 2023.  I mentioned to the patient and his daughter that the radiologist mentioned the possibility of sinusitis.  We talked about empiric treatment but we are all reluctant to introduce new medications that may be unnecessary the situation and possibly make things worse.  I talked about the possibility that these hallucinations may be age-related,  and I spoke privately with his daughter about the possibility of Lewy body dementia.  We are in agreement that there is no indication to admit the patient to the hospital and we are concerned that hospitalization will significantly worsen his symptoms with delirium.  However his daughter knows the importance of close outpatient follow-up.  The patient's medical screening exam is reassuring with no indication of an emergent medical condition requiring hospitalization or additional evaluation at this point.  The patient is safe and appropriate for discharge and outpatient follow up.  His daughter will stay with him for the time being.      FINAL CLINICAL IMPRESSION(S) / ED DIAGNOSES   Final diagnoses:  Hallucinations  Nonintractable headache, unspecified chronicity pattern, unspecified headache type     Rx / DC Orders   ED Discharge Orders     None        Note:  This document was prepared using Dragon voice recognition software and may include unintentional dictation errors.   Gordan Huxley, MD 10/13/23 [phone]

## 2023-10-12 NOTE — ED Triage Notes (Signed)
 Pt presents to the ED via [facility] from home with complaints of hallucinations and AMS x 2 days. Hx of brain tumors - endorses a headache x 1 week. Non-compliant with meds. A&Ox4 at this time. Denies CP or SOB.

## 2023-10-13 DIAGNOSIS — E1143 Type 2 diabetes mellitus with diabetic autonomic (poly)neuropathy: Secondary | ICD-10-CM | POA: Diagnosis not present

## 2023-10-13 DIAGNOSIS — N1832 Chronic kidney disease, stage 3b: Secondary | ICD-10-CM | POA: Diagnosis not present

## 2023-10-13 DIAGNOSIS — R058 Other specified cough: Secondary | ICD-10-CM | POA: Diagnosis not present

## 2023-10-13 DIAGNOSIS — I10 Essential (primary) hypertension: Secondary | ICD-10-CM | POA: Diagnosis not present

## 2023-10-13 DIAGNOSIS — R441 Visual hallucinations: Secondary | ICD-10-CM | POA: Diagnosis not present

## 2023-10-13 DIAGNOSIS — R440 Auditory hallucinations: Secondary | ICD-10-CM | POA: Diagnosis not present

## 2023-10-13 DIAGNOSIS — R0602 Shortness of breath: Secondary | ICD-10-CM | POA: Diagnosis not present

## 2023-10-13 NOTE — Discharge Instructions (Signed)
 As we discussed, your evaluation tonight was reassuring.  Your CT scan showed that your meningioma or schwannoma has not increased in size since your last MRI in 2023.  There is no evidence you have a urinary tract infection and your blood work looks good.  We recommend you follow-up with your primary care doctor at the next available opportunity to discuss your symptoms with him and see what additional outpatient workup he would recommend.    Return to the emergency department if you develop new or worsening symptoms that concern you.

## 2023-10-14 LAB — URINE CULTURE

## 2023-10-16 DIAGNOSIS — G3183 Dementia with Lewy bodies: Secondary | ICD-10-CM | POA: Diagnosis not present

## 2023-10-16 DIAGNOSIS — J018 Other acute sinusitis: Secondary | ICD-10-CM | POA: Diagnosis not present

## 2023-10-16 DIAGNOSIS — F02B0 Dementia in other diseases classified elsewhere, moderate, without behavioral disturbance, psychotic disturbance, mood disturbance, and anxiety: Secondary | ICD-10-CM | POA: Diagnosis not present

## 2023-10-18 ENCOUNTER — Ambulatory Visit: Admitting: Dermatology

## 2023-10-18 ENCOUNTER — Encounter: Payer: Self-pay | Admitting: Dermatology

## 2023-10-18 VITALS — BP 136/73 | HR 73 | Temp 97.7°F

## 2023-10-18 DIAGNOSIS — C4442 Squamous cell carcinoma of skin of scalp and neck: Secondary | ICD-10-CM | POA: Diagnosis not present

## 2023-10-18 DIAGNOSIS — S0000XD Unspecified superficial injury of scalp, subsequent encounter: Secondary | ICD-10-CM

## 2023-10-18 DIAGNOSIS — L579 Skin changes due to chronic exposure to nonionizing radiation, unspecified: Secondary | ICD-10-CM | POA: Diagnosis not present

## 2023-10-18 DIAGNOSIS — L814 Other melanin hyperpigmentation: Secondary | ICD-10-CM

## 2023-10-18 DIAGNOSIS — T1490XD Injury, unspecified, subsequent encounter: Secondary | ICD-10-CM

## 2023-10-18 DIAGNOSIS — C4492 Squamous cell carcinoma of skin, unspecified: Secondary | ICD-10-CM

## 2023-10-18 DIAGNOSIS — Z85828 Personal history of other malignant neoplasm of skin: Secondary | ICD-10-CM | POA: Diagnosis not present

## 2023-10-18 NOTE — Progress Notes (Signed)
 Follow-Up Visit   Subjective  Kevin Choi is a 83 y.o. male who presents for the following: Mohs for a Well Differentiated Squamous Cell Carcinoma on the left parietal scalp. Lesion was biopsied by Dr. Claudene.  He is a/sp s/p Mohs for a Atypical Squamous Proliferation of the right parietal scalp, treated on 09/19/2023.  The following portions of the chart were reviewed this encounter and updated as appropriate: medications, allergies, medical history  Review of Systems:  No other skin or systemic complaints except as noted in HPI or Assessment and Plan.  Objective  Well appearing patient in no apparent distress; mood and affect are within normal limits.  A focused examination was performed of the following areas: Left parietal scalp Relevant physical exam findings are noted in the Assessment and Plan.   Left Parietal Scalp Hyperkeratotic papule   Assessment & Plan   Healing wound s/p Mohs for SCC on the right parietal scalp, treated on 09/19/2023, repaired with a purse string closure and healing second intention.  - Reassured that wound is healing well - Wound cleansed and debrided of crusting - Advised to continue wound care twice daily with bandage until follow up  HISTORY OF SQUAMOUS CELL CARCINOMA OF THE SKIN - No evidence of recurrence today - No lymphadenopathy - Recommend regular full body skin exams - Recommend daily broad spectrum sunscreen SPF 30+ to sun-exposed areas, reapply every 2 hours as needed.  - Call if any new or changing lesions are noted between office visits  SQUAMOUS CELL CARCINOMA OF SKIN Left Parietal Scalp Mohs surgery  Consent obtained: written  Anticoagulation: Is the patient taking prescription anticoagulant and/or aspirin prescribed/recommended by a physician? Yes   Was the anticoagulation regimen changed prior to Mohs? No    Anesthesia: Anesthesia method: local infiltration Local anesthetic: lidocaine 1% WITH epi  Procedure  Details: Timeout: pre-procedure verification complete Procedure Prep: patient was prepped and draped in usual sterile fashion Prep type: chlorhexidine Biopsy accession number: [id number] Biopsy lab: [facility] Date of biopsy: 09/14/2023 Frozen section biopsy performed: No   Specimen debulked: No   Pre-Op diagnosis: squamous cell carcinoma SCC subtype: well differentiated MohsAIQ Surgical site (if tumor spans multiple areas, please select predominant area): scalp Surgery side: left Surgical site (from skin exam): Left Parietal Scalp Pre-operative length (cm): 0.8 Pre-operative width (cm): 0.7 Indications for Mohs surgery: anatomic location where tissue conservation is critical Previously treated? No    Micrographic Surgery Details: Post-operative length (cm): 1.2 Post-operative width (cm): 1.4 Number of Mohs stages: 1 Cumulative additional sections past 5 per stage: 0 Post surgery depth of defect: dermis and subcutaneous fat  Stage 1    Tumor features identified on Mohs section: no tumor identified  Patient tolerance of procedure: tolerated well, no immediate complications  Reconstruction: Was the defect reconstructed?: No    Opioids: Did the patient receive a prescription for opioid/narcotic related to Mohs surgery?: No    Antibiotics: Does patient meet AHA guidelines for endocarditis?: No   Does patient meet AHA guidelines for orthopedic prophylaxis?: No   Were antibiotics given on the day of surgery?: No   Did surgery breach mucosa, expose cartilage/bone, involve an area of lymphedema/inflamed/infected tissue? No    Related Medications traMADol  (ULTRAM ) 50 MG tablet Take 1 tablet (50 mg total) by mouth every 6 (six) hours as needed for up to 8 doses.   Return in about 4 weeks (around 11/15/2023) for Wound Check.  LILLETTE Rollene Gobble, RN, am acting as scribe  for RUFUS CHRISTELLA HOLY, MD .   10/18/2023  HISTORY OF PRESENT ILLNESS  Kevin Choi is seen in  consultation at the request of Dr. Claudene for biopsy-proven Well Differentiated Squamous Cell Carcinoma on the left parietal scalp. They note that the area has been present for about 6 months increasing in size with time.  There is no history of previous treatment.  Reports no other new or changing lesions and has no other complaints today.  Medications and allergies: see patient chart.  Review of systems: Reviewed 8 systems and notable for the above skin cancer.  All other systems reviewed are unremarkable/negative, unless noted in the HPI. Past medical history, surgical history, family history, social history were also reviewed and are noted in the chart/questionnaire.    PHYSICAL EXAMINATION  General: Well-appearing, in no acute distress, alert and oriented x 4. Vitals reviewed in chart (if available).   Skin: Exam reveals a 0.8 x 0.7 cm erythematous papule and biopsy scar on the left parietal scalp. There are rhytids, telangiectasias, and lentigines, consistent with photodamage.  Biopsy report(s) reviewed, confirming the diagnosis.   ASSESSMENT  1) Well Differentiated Squamous Cell Carcinoma on the left parietal scalp 2) photodamage 3) solar lentigines   PLAN   1. Due to location, size, histology, or recurrence and the likelihood of subclinical extension as well as the need to conserve normal surrounding tissue, the patient was deemed acceptable for Mohs micrographic surgery (MMS).  The nature and purpose of the procedure, associated benefits and risks including recurrence and scarring, possible complications such as pain, infection, and bleeding, and alternative methods of treatment if appropriate were discussed with the patient during consent. The lesion location was verified by the patient, by reviewing previous notes, pathology reports, and by photographs as well as angulation measurements if available.  Informed consent was reviewed and signed by the patient, and timeout was performed  at 8:30 AM. See op note below.  2. For the photodamage and solar lentigines, sun protection discussed/information given on OTC sunscreens, and we recommend continued regular follow-up with primary dermatologist every 6 months or sooner for any growing, bleeding, or changing lesions. 3. Prognosis and future surveillance discussed. 4. Letter with treatment outcome sent to referring provider. 5. Pain acetaminophen /ibuprofen  MOHS MICROGRAPHIC SURGERY AND RECONSTRUCTION  Initial size:   0.8 x 0.7 cm Surgical defect/wound size: 1.2 x 1.4 cm Anesthesia:    0.33% lidocaine with 1:200,000 epinephrine EBL:    <5 mL Complications:  None Repair type:   Second Intention  Stages: 1  STAGE I: Anesthesia achieved with 0.5% lidocaine with 1:200,000 epinephrine. ChloraPrep applied. 2 section(s) excised using Mohs technique (this includes total peripheral and deep tissue margin excision and evaluation with frozen sections, excised and interpreted by the same physician). The tumor was first debulked and then excised with an approx. 2mm margin.  Hemostasis was achieved with electrocautery as needed.  The specimen was then oriented, subdivided/relaxed, inked, and processed using Mohs technique.    Frozen section analysis revealed a clear deep and peripheral margin. Partial thickness atypical squamous cells noted in epidermis, which were curetted at the time of bandaging.   Reconstruction  Patient was notified of results and repair options were discussed, including second intention healing. After reviewing the advantages and disadvantages of each, we agreed on second intention healing as appropriate.   The surgical site was then lightly scrubbed with sterile, saline-soaked gauze.  The area was bandaged using Vaseline ointment, non-adherent gauze, gauze pads, and tape  to provide an adequate pressure dressing.   The patient tolerated the procedure well, was given detailed written and verbal wound care instructions,  and was discharged in good condition.  The patient will follow-up in 4 weeks and as scheduled with primary dermatologist.  Documentation: I have reviewed the above documentation for accuracy and completeness, and I agree with the above.  RUFUS CHRISTELLA HOLY, MD

## 2023-10-18 NOTE — Patient Instructions (Signed)

## 2023-10-25 ENCOUNTER — Encounter: Payer: Self-pay | Admitting: Dermatology

## 2023-10-27 ENCOUNTER — Other Ambulatory Visit: Payer: Self-pay | Admitting: Cardiovascular Disease

## 2023-11-15 ENCOUNTER — Ambulatory Visit: Admitting: Dermatology

## 2023-11-15 ENCOUNTER — Encounter: Payer: Self-pay | Admitting: Dermatology

## 2023-11-15 DIAGNOSIS — C4442 Squamous cell carcinoma of skin of scalp and neck: Secondary | ICD-10-CM | POA: Diagnosis not present

## 2023-11-15 DIAGNOSIS — D492 Neoplasm of unspecified behavior of bone, soft tissue, and skin: Secondary | ICD-10-CM | POA: Diagnosis not present

## 2023-11-15 DIAGNOSIS — Z85828 Personal history of other malignant neoplasm of skin: Secondary | ICD-10-CM

## 2023-11-15 DIAGNOSIS — D485 Neoplasm of uncertain behavior of skin: Secondary | ICD-10-CM

## 2023-11-15 DIAGNOSIS — S0100XD Unspecified open wound of scalp, subsequent encounter: Secondary | ICD-10-CM | POA: Diagnosis not present

## 2023-11-15 DIAGNOSIS — C4492 Squamous cell carcinoma of skin, unspecified: Secondary | ICD-10-CM

## 2023-11-15 DIAGNOSIS — L905 Scar conditions and fibrosis of skin: Secondary | ICD-10-CM

## 2023-11-15 NOTE — Patient Instructions (Signed)

## 2023-11-15 NOTE — Progress Notes (Signed)
   Follow Up Visit   Subjective  Kevin Choi is a 83 y.o. male who presents for the following: follow up from Mohs surgery   The patient presents for follow up from Mohs surgery for a SCC on the left parietal scalp, treated on 10/18/23, repaired with 2nd intention. The patient has been bandaging the wound as directed. The endorse the following concerns: none  Patient is accompanied by wife.   He is a/sp s/p Mohs for a Atypical Squamous Proliferation of the right parietal scalp, treated on 09/19/2023.  The following portions of the chart were reviewed this encounter and updated as appropriate: medications, allergies, medical history  Review of Systems:  No other skin or systemic complaints except as noted in HPI or Assessment and Plan.  Objective  Well appearing patient in no apparent distress; mood and affect are within normal limits.  A focal examination was performed including  head, face and left parietal scalp. All findings within normal limits unless otherwise noted below.  Healing wound with mild erythema  Relevant physical exam findings are noted in the Assessment and Plan.  Mid Frontal Scalp 4mm pink nodule adjacent wound   Assessment & Plan   Healing Wound with thick dry scale s/p Mohs for SCC, treated on 10/18/23, healing by 2nd intention - Discussed that lesion is very dried out and needs more consistent ointment - Concern for adjacent nodule to wound being SCC, will biopsy today  Scar s/p Mohs for SCC on the right parietal scalp, treated on 09/19/2023, repaired with a purse string closure and healing second intention.  - Reassured that wound is healing well - OK to stop ointment NEOPLASM OF UNCERTAIN BEHAVIOR OF SKIN Mid Frontal Scalp Skin / nail biopsy Type of biopsy: tangential   Informed consent: discussed and consent obtained   Timeout: patient name, date of birth, surgical site, and procedure verified   Procedure prep:  Patient was prepped and draped in  usual sterile fashion Prep type:  Isopropyl alcohol Anesthesia: the lesion was anesthetized in a standard fashion   Anesthetic:  1% lidocaine w/ epinephrine 1-100,000 buffered w/ 8.4% NaHCO3 Instrument used: DermaBlade   Hemostasis achieved with: aluminum chloride   Outcome: patient tolerated procedure well   Post-procedure details: sterile dressing applied and wound care instructions given    Specimen 1 - Surgical pathology Differential Diagnosis: R/O recurrent SCC vs other  Check Margins: No  Return in about 4 weeks (around 12/13/2023) for wound check.  I, Darice Smock, CMA, am acting as scribe for RUFUS CHRISTELLA HOLY, MD.   Documentation: I have reviewed the above documentation for accuracy and completeness, and I agree with the above.  RUFUS CHRISTELLA HOLY, MD

## 2023-11-17 ENCOUNTER — Ambulatory Visit: Payer: Self-pay | Admitting: Urology

## 2023-11-17 LAB — SURGICAL PATHOLOGY

## 2023-11-20 ENCOUNTER — Ambulatory Visit: Payer: Self-pay | Admitting: Dermatology

## 2023-11-22 ENCOUNTER — Ambulatory Visit (INDEPENDENT_AMBULATORY_CARE_PROVIDER_SITE_OTHER): Admitting: Urology

## 2023-11-22 ENCOUNTER — Encounter: Payer: Self-pay | Admitting: Urology

## 2023-11-22 VITALS — BP 182/74 | HR 67 | Ht 68.5 in | Wt 122.5 lb

## 2023-11-22 DIAGNOSIS — R3914 Feeling of incomplete bladder emptying: Secondary | ICD-10-CM

## 2023-11-22 DIAGNOSIS — N401 Enlarged prostate with lower urinary tract symptoms: Secondary | ICD-10-CM

## 2023-11-22 DIAGNOSIS — N2889 Other specified disorders of kidney and ureter: Secondary | ICD-10-CM | POA: Diagnosis not present

## 2023-11-22 LAB — BLADDER SCAN AMB NON-IMAGING

## 2023-11-22 NOTE — Patient Instructions (Signed)
 [facility] [phone] MRI in Feb 2026

## 2023-11-22 NOTE — Progress Notes (Signed)
 11/22/2023 11:36 AM   Kevin Choi Aug 09, 1940 [id number]  Referring provider: Alla Amis, MD [phone] [facility] [address] [facility] [place],  [place] [address]  Chief Complaint  Patient presents with   Follow-up   Urologic history: 1.  BPH with lower urinary tract symptoms On finasteride  and Silodosin  Mild to moderate residual   2.  History elevated PSA History of several biopsies between 2003-2010 No records available; all benign per patient Baseline uncorrected PSA upper 1 range Prostate cancer screening discontinued  HPI: Kevin Choi is a 83 y.o. male presents for annual follow-up.  Stable lower urinary tract symptoms; remains on finasteride /silodosin  Denies dysuria, gross hematuria No flank, abdominal or pelvic pain Renal mass protocol MRI abdomen 06/26/2023 remarkable for a 2.5 x 2.2 cm enhancing right renal mass  PMH: Past Medical History:  Diagnosis Date   Allergic rhinitis due to pollen    CAD (coronary artery disease) 2002   bypass   Chicken pox    Diabetes (HCC)    Diabetes mellitus    Dysplastic nevus 01/25/2008   Right mid back. Moderate atypia, close to margin.   Elevated prostate specific antigen (PSA) 02/21/2001   Headache(784.0)    Hemiplegic migraine, without mention of intractable migraine without mention of status migrainosus    HLD (hyperlipidemia)    HTN (hypertension) 05/08/2007   Hyperhidrosis    Impotence of organic origin 07/24/2007   Leg pain    Measles    Mumps    Nausea with vomiting    Neuropathy    Occlusion and stenosis of carotid artery without mention of cerebral infarction 09/15/2006   Peripheral neuropathy 01/31/2008   Peripheral vascular disease    Personal history of urinary calculi    Pleural effusion 2007   with pneumothorax    Restless legs syndrome (RLS) 01/10/2007   SCC (squamous cell carcinoma) 08/15/2023   right scalp- tx mohs Dr Corey 09/19/2023   Slurred speech    Squamous cell carcinoma of skin  09/14/2023   Left parietal scalp. Needs Mohs   TIA (transient ischemic attack)    Patient stated 2 months ago    Surgical History: Past Surgical History:  Procedure Laterality Date   CAROTID ENDARTERECTOMY  2001   right   CATHETER REMOVAL Right 06/03/2019   CHOLECYSTECTOMY  2003   collapse lung  2007   CORONARY ARTERY BYPASS GRAFT  2001   KNEE SURGERY Left 1981   LUNG SURGERY  2007   Left; decortication left lung for persistant pleural effusion and PTX. admission ten days at [facility].   PAROTID GLAND TUMOR EXCISION  1973   VASECTOMY      Home Medications:  Allergies as of 11/22/2023       Reactions   Iodinated Contrast Media Nausea Only   Hypotension Hypotension Hypotension    Hypotension   Other Nausea Only, Other (See Comments)   Contrast Dye-drops blood pressure. Contrast Dye- hypotension and nausa and syncope Contrast Dye-drops blood pressure. Contrast Dye- hypotension and nausa and syncope    Contrast Dye- hypotension and nausa and syncope        Medication List        Accurate as of November 22, 2023 11:36 AM. If you have any questions, ask your nurse or doctor.          STOP taking these medications    traMADol  50 MG tablet Commonly known as: Ultram  Stopped by: Kevin Choi       TAKE these  medications    acetaminophen  500 MG tablet Commonly known as: TYLENOL  Take 500 mg by mouth every 6 (six) hours as needed. Patient states he can take 2 tablets daily as needed for pain   atorvastatin  40 MG tablet Commonly known as: LIPITOR Take 1 tablet (40 mg total) by mouth daily.   cetirizine 10 MG tablet Commonly known as: ZYRTEC Take 10 mg by mouth as needed.   fenofibrate 160 MG tablet Take 1 tablet by mouth daily.   finasteride  5 MG tablet Commonly known as: PROSCAR  TAKE 1 TABLET BY MOUTH ONCE A DAY   furosemide  40 MG tablet Commonly known as: LASIX  Take 1 tablet (40 mg total) by mouth 2 (two) times daily.   gabapentin  300 MG  capsule Commonly known as: NEURONTIN  Take 300 mg by mouth 3 (three) times daily.   glipiZIDE 10 MG 24 hr tablet Commonly known as: GLUCOTROL XL Take 10 mg by mouth daily.   glucose blood test strip   ketoconazole  2 % shampoo Commonly known as: NIZORAL  Apply to scalp as directed. Leave in for 10 minutes before rinsing.   losartan  25 MG tablet Commonly known as: COZAAR  Take 25 mg by mouth daily.   magnesium gluconate 500 MG tablet Commonly known as: MAGONATE Take 500 mg by mouth daily.   metoCLOPramide  10 MG tablet Commonly known as: REGLAN  Take 1 tablet (10 mg total) by mouth every 8 (eight) hours as needed for nausea (headache).   metoprolol  succinate 50 MG 24 hr tablet Commonly known as: TOPROL -XL Take 0.5 tablets (25 mg total) by mouth daily. PLEASE MAKE AN APPOINTMENT IN ORDER TO RECEIVE FUTURE REFILLS. FIRST ATTEMPT.   Multi-Vitamin tablet Take by mouth.   nitroGLYCERIN  0.4 MG SL tablet Commonly known as: Nitrostat  Place 1 tablet (0.4 mg total) under the tongue every 5 (five) minutes as needed for chest pain.   potassium chloride  10 MEQ tablet Commonly known as: KLOR-CON  TAKE ONE TABLET BY MOUTH DAILY   rOPINIRole 0.5 MG tablet Commonly known as: REQUIP Take 0.5 mg by mouth at bedtime.   silodosin  8 MG Caps capsule Commonly known as: RAPAFLO  Take 1 capsule (8 mg total) by mouth daily with breakfast.   spironolactone  25 MG tablet Commonly known as: ALDACTONE  TAKE ONE TABLET (25 MG TOTAL) BY MOUTH DAILY. (NEED TO CALL AND SCHEDULE OVER DUE APPOINTMENT WITH MD)   triamcinolone  lotion 0.1 % Commonly known as: KENALOG  Apply 1 application topically 2 (two) times daily.   Xarelto  20 MG Tabs tablet Generic drug: rivaroxaban  TAKE ONE TABLET BY MOUTH ONCE A DAY        Allergies:  Allergies  Allergen Reactions   Iodinated Contrast Media Nausea Only    Hypotension  Hypotension Hypotension    Hypotension   Other Nausea Only and Other (See Comments)     Contrast Dye-drops blood pressure.  Contrast Dye- hypotension and nausa and syncope  Contrast Dye-drops blood pressure. Contrast Dye- hypotension and nausa and syncope    Contrast Dye- hypotension and nausa and syncope    Family History: Family History  Problem Relation Age of Onset   Heart attack Mother    Heart failure Father    Angina Father    Emphysema Father    Cancer Father        prostate   Lymphoma Sister        Non-Hodgkins   Cancer Other    Coronary artery disease Other        brother   Heart  attack Brother    Heart disease Maternal Aunt    Colon polyps Sister    Anxiety disorder Brother     Social History:  reports that he has quit smoking. His smoking use included cigarettes. He has a 20 pack-year smoking history. He has never used smokeless tobacco. He reports that he does not drink alcohol and does not use drugs.   Physical Exam: BP (!) 182/74   Pulse 67   Ht 5' 8.5 (1.74 m)   Wt 122 lb 8 oz (55.6 kg)   BMI 18.36 kg/m   Constitutional:  Alert, No acute distress. HEENT: [place] AT Respiratory: Normal respiratory effort, no increased work of breathing. Psychiatric: Normal mood and affect.   Assessment & Plan:    1.  BPH with LUTS Stable on finasteride /silodosin ; did not need refills at this time PVR today stable at 183 mL  2.  Renal mass We discussed an enhancing renal mass is consistent with a solid renal neoplasm and there is a ~70% chance this is a small renal cell carcinoma.  We discussed solid renal masses <4 cm in size are typically indolent and can be monitored.  Percutaneous ablation by IR and surgical excision was also discussed.  He has elected surveillance and will schedule a follow-up renal mass protocol MRI in February 2026   Kevin JAYSON Barba, MD  [facility] [address], [address] [place], [place] [address] [phone]

## 2023-11-27 DIAGNOSIS — F02B0 Dementia in other diseases classified elsewhere, moderate, without behavioral disturbance, psychotic disturbance, mood disturbance, and anxiety: Secondary | ICD-10-CM | POA: Diagnosis not present

## 2023-11-27 DIAGNOSIS — G3183 Dementia with Lewy bodies: Secondary | ICD-10-CM | POA: Diagnosis not present

## 2023-11-29 ENCOUNTER — Encounter: Payer: Self-pay | Admitting: Dermatology

## 2023-12-05 ENCOUNTER — Ambulatory Visit: Admitting: Dermatology

## 2023-12-05 NOTE — Patient Instructions (Addendum)
 Kevin Choi

## 2023-12-05 NOTE — Progress Notes (Deleted)
   Follow-Up Visit   Subjective  Kevin Choi is a 83 y.o. male who presents for the following:mohs of a well differentiated squamous cell carcinoma on the mid frontal scalp  The following portions of the chart were reviewed this encounter and updated as appropriate: medications, allergies, medical history  Review of Systems:  No other skin or systemic complaints except as noted in HPI or Assessment and Plan.  Objective  Well appearing patient in no apparent distress; mood and affect are within normal limits.  A focused examination was performed of the following areas: Mid frontal scalp Relevant physical exam findings are noted in the Assessment and Plan.     Assessment & Plan      No follow-ups on file.  ***  Documentation: I have reviewed the above documentation for accuracy and completeness, and I agree with the above.  RUFUS CHRISTELLA HOLY, MD

## 2023-12-12 DIAGNOSIS — H6123 Impacted cerumen, bilateral: Secondary | ICD-10-CM | POA: Diagnosis not present

## 2023-12-12 DIAGNOSIS — H90A22 Sensorineural hearing loss, unilateral, left ear, with restricted hearing on the contralateral side: Secondary | ICD-10-CM | POA: Diagnosis not present

## 2023-12-14 ENCOUNTER — Ambulatory Visit: Admitting: Dermatology

## 2023-12-20 ENCOUNTER — Encounter: Payer: Self-pay | Admitting: Dermatology

## 2023-12-20 ENCOUNTER — Ambulatory Visit (INDEPENDENT_AMBULATORY_CARE_PROVIDER_SITE_OTHER): Admitting: Dermatology

## 2023-12-20 VITALS — BP 155/86 | HR 84

## 2023-12-20 DIAGNOSIS — C4442 Squamous cell carcinoma of skin of scalp and neck: Secondary | ICD-10-CM

## 2023-12-20 DIAGNOSIS — L905 Scar conditions and fibrosis of skin: Secondary | ICD-10-CM

## 2023-12-20 DIAGNOSIS — T1490XD Injury, unspecified, subsequent encounter: Secondary | ICD-10-CM

## 2023-12-20 DIAGNOSIS — C4492 Squamous cell carcinoma of skin, unspecified: Secondary | ICD-10-CM

## 2023-12-20 MED ORDER — FLUOROURACIL CHEMO INJECTION 500 MG/10ML FOR DERMATOLOGY USE
20.0000 mg | Freq: Once | INTRAVENOUS | Status: AC
  Administered 2023-12-20: 20 mg via INTRALESIONAL

## 2023-12-20 NOTE — Progress Notes (Addendum)
   Follow Up Visit   Subjective  Kevin Choi is a 83 y.o. male who presents for the following: follow up from Mohs surgery   The patient presents for follow up from Mohs surgery for a SCC on the left parietal scalp, treated on 10/18/23, repaired with 2nd intention. The patient has been bandaging the wound as directed. The endorse the following concerns: Patient reports that he will not longer be able to come to our office for treatments after today's visit due to his license being revoked. He is currently scheduled for surgery for a SCC on the mid parietal scalp in November, and discussed with him that we can assist in coordinating transportation for this visit. Patient states that he has a lot of changes that are going to be made and would like to forgo the surgery at this time.   Patient is accompanied by wife.   The following portions of the chart were reviewed this encounter and updated as appropriate: medications, allergies, medical history  Review of Systems:  No other skin or systemic complaints except as noted in HPI or Assessment and Plan.  Objective  Well appearing patient in no apparent distress; mood and affect are within normal limits.  A focal examination was performed including  head, face and left parietal scalp. All findings within normal limits unless otherwise noted below.  Healing wound with mild erythema  Relevant physical exam findings are noted in the Assessment and Plan.  Mid Frontal Scalp Hyperkeratotic papule adjacent wound   Assessment & Plan   Healing Wound with thick dry scale s/p Mohs for SCC on left parietal scalp, treated on 10/18/23, healing by 2nd intention - Discussed that lesion is very dried out and needs more consistent ointment  Well Differentiated Squamous Cell Carcinoma- likely KA like reactive lesion, adjacent previous Mohs wound - will proceed with IL5FU based on patient's  - 0.4 mL IL5FU injected  Treatment Plan: IL5FU  Location: left  parietal scalp adjacent Mohs site  Informed Consent: Discussed risks (infection, pain, bleeding, bruising, thinning of the skin, loss of skin pigment, lack of resolution, and recurrence of lesion) and benefits of the procedure, as well as the alternatives. Informed consent was obtained. Preparation: The area was prepared a standard fashion.  Procedure Details: An intralesional injection was performed with 5-fluorouracil. 0.4 cc in total were injected.  Total number of injections: 1  Plan: The patient was instructed on post-op care. Recommend OTC analgesia as needed for pain.   HISTORY OF SQUAMOUS CELL CARCINOMA OF THE SKIN - No evidence of recurrence today - No lymphadenopathy - Recommend regular full body skin exams - Recommend daily broad spectrum sunscreen SPF 30+ to sun-exposed areas, reapply every 2 hours as needed.  - Call if any new or changing lesions are noted between office visits  Scar s/p Mohs for [facility] on the right parietal scalp, treated on 09/19/2023, repaired with a purse string closure and healing second intention.  - Reassured that wound is healing well SQUAMOUS CELL CARCINOMA OF SKIN Mid Frontal Scalp fluorouracil (ADRUCIL) chemo injection 20 mg  HEALING WOUND   SCAR    Return if symptoms worsen or fail to improve.  Kevin Rollene Gobble, RN, am acting as scribe for RUFUS CHRISTELLA HOLY, MD .   Documentation: I have reviewed the above documentation for accuracy and completeness, and I agree with the above.  RUFUS CHRISTELLA HOLY, MD

## 2023-12-26 DIAGNOSIS — Z1331 Encounter for screening for depression: Secondary | ICD-10-CM | POA: Diagnosis not present

## 2023-12-26 DIAGNOSIS — F02B0 Dementia in other diseases classified elsewhere, moderate, without behavioral disturbance, psychotic disturbance, mood disturbance, and anxiety: Secondary | ICD-10-CM | POA: Diagnosis not present

## 2023-12-26 DIAGNOSIS — G939 Disorder of brain, unspecified: Secondary | ICD-10-CM | POA: Diagnosis not present

## 2023-12-26 DIAGNOSIS — G3183 Dementia with Lewy bodies: Secondary | ICD-10-CM | POA: Diagnosis not present

## 2023-12-26 DIAGNOSIS — R296 Repeated falls: Secondary | ICD-10-CM | POA: Diagnosis not present

## 2023-12-26 DIAGNOSIS — H9190 Unspecified hearing loss, unspecified ear: Secondary | ICD-10-CM | POA: Diagnosis not present

## 2023-12-28 ENCOUNTER — Ambulatory Visit: Admitting: Dermatology

## 2023-12-29 MED ORDER — FLUOROURACIL CHEMO INJECTION 500 MG/10ML FOR DERMATOLOGY USE: 50.0000 mg | Freq: Once | INTRAVENOUS | Status: AC

## 2023-12-29 NOTE — Addendum Note (Signed)
 Addended by: COREY RUFUS HERO on: 12/29/2023 12:52 PM   Modules accepted: Orders

## 2024-01-01 ENCOUNTER — Encounter: Admitting: Dermatology

## 2024-01-13 ENCOUNTER — Telehealth: Payer: Self-pay | Admitting: Cardiovascular Disease

## 2024-01-13 ENCOUNTER — Other Ambulatory Visit: Payer: Self-pay | Admitting: Urology

## 2024-01-15 DIAGNOSIS — Z7984 Long term (current) use of oral hypoglycemic drugs: Secondary | ICD-10-CM | POA: Diagnosis not present

## 2024-01-15 DIAGNOSIS — R600 Localized edema: Secondary | ICD-10-CM | POA: Diagnosis not present

## 2024-01-15 DIAGNOSIS — N2889 Other specified disorders of kidney and ureter: Secondary | ICD-10-CM | POA: Diagnosis not present

## 2024-01-15 DIAGNOSIS — I10 Essential (primary) hypertension: Secondary | ICD-10-CM | POA: Diagnosis not present

## 2024-01-15 DIAGNOSIS — N1831 Chronic kidney disease, stage 3a: Secondary | ICD-10-CM | POA: Diagnosis not present

## 2024-01-15 DIAGNOSIS — E1122 Type 2 diabetes mellitus with diabetic chronic kidney disease: Secondary | ICD-10-CM | POA: Diagnosis not present

## 2024-01-16 MED ORDER — NITROGLYCERIN 0.4 MG SL SUBL: 0.4000 mg | SUBLINGUAL_TABLET | SUBLINGUAL | 0 refills | Status: AC | PRN

## 2024-01-16 NOTE — Telephone Encounter (Signed)
 Spoke with [facility] nurse [place] over the phone. States that patients dementia is worsening as well as spouses dementia. Patients daughter is getting involved to ensure patient doesn't miss any follow up appointments. Nurse also states that daughter is the best point of contact. Has follow up in January. Refills sent it to make it to appointment.

## 2024-01-16 NOTE — Telephone Encounter (Signed)
*  STAT* If patient is at the pharmacy, call can be transferred to refill team.   1. Which medications need to be refilled? (please list name of each medication and dose if known)   nitroGLYCERIN  (NITROSTAT ) 0.4 MG SL tablet    2. Which pharmacy/location (including street and city if local pharmacy) is medication to be sent to? [facility] - [place], [place] - [address]    3. Do they need a 30 day or 90 day supply?  Standard supply  Tonya with [facility] is following up regarding request for Spironolactone , Potassium, and Furosemide . She would like to add Nitroglycerin  if at all possible.

## 2024-01-23 DIAGNOSIS — N189 Chronic kidney disease, unspecified: Secondary | ICD-10-CM | POA: Diagnosis not present

## 2024-01-23 DIAGNOSIS — G3183 Dementia with Lewy bodies: Secondary | ICD-10-CM | POA: Diagnosis not present

## 2024-01-23 DIAGNOSIS — I130 Hypertensive heart and chronic kidney disease with heart failure and stage 1 through stage 4 chronic kidney disease, or unspecified chronic kidney disease: Secondary | ICD-10-CM | POA: Diagnosis not present

## 2024-01-23 DIAGNOSIS — I5032 Chronic diastolic (congestive) heart failure: Secondary | ICD-10-CM | POA: Diagnosis not present

## 2024-01-23 DIAGNOSIS — E1122 Type 2 diabetes mellitus with diabetic chronic kidney disease: Secondary | ICD-10-CM | POA: Diagnosis not present

## 2024-01-23 DIAGNOSIS — F0284 Dementia in other diseases classified elsewhere, unspecified severity, with anxiety: Secondary | ICD-10-CM | POA: Diagnosis not present

## 2024-01-23 DIAGNOSIS — G20A1 Parkinson's disease without dyskinesia, without mention of fluctuations: Secondary | ICD-10-CM | POA: Diagnosis not present

## 2024-01-25 ENCOUNTER — Telehealth: Payer: Self-pay | Admitting: Cardiovascular Disease

## 2024-01-25 NOTE — Telephone Encounter (Signed)
 Pharmacy requesting c/b to discuss patients active medications list. Please advise.

## 2024-01-29 ENCOUNTER — Other Ambulatory Visit (HOSPITAL_COMMUNITY): Payer: Self-pay

## 2024-01-29 ENCOUNTER — Telehealth: Payer: Self-pay | Admitting: Cardiovascular Disease

## 2024-01-29 MED ORDER — METOPROLOL SUCCINATE ER 50 MG PO TB24
25.0000 mg | ORAL_TABLET | Freq: Every day | ORAL | 0 refills | Status: AC
  Filled 2024-01-29: qty 20, 40d supply, fill #0

## 2024-01-29 NOTE — Telephone Encounter (Signed)
 Pt scheduled to see Dr. Gollan 03/01/24, refill sent.

## 2024-01-29 NOTE — Telephone Encounter (Signed)
*  STAT* If patient is at the pharmacy, call can be transferred to refill team.   1. Which medications need to be refilled? (please list name of each medication and dose if known)  metoprolol  succinate (TOPROL -XL) 25 MG 24 hr tablet    2. Would you like to learn more about the convenience, safety, & potential cost savings by using the [facility]?      3. Are you open to using the [facility] (Type [facility].  ).   4. Which pharmacy/location (including street and city if local pharmacy) is medication to be sent to? [facility] - [place], [place] - [address]     5. Do they need a 30 day or 90 day supply? 90 day

## 2024-01-30 NOTE — Telephone Encounter (Signed)
See other note.  This is taken care of.

## 2024-02-08 ENCOUNTER — Other Ambulatory Visit (HOSPITAL_COMMUNITY): Payer: Self-pay

## 2024-02-21 ENCOUNTER — Other Ambulatory Visit: Payer: Self-pay | Admitting: Cardiovascular Disease

## 2024-02-23 ENCOUNTER — Other Ambulatory Visit: Payer: Self-pay | Admitting: Cardiovascular Disease

## 2024-02-28 NOTE — Progress Notes (Signed)
 Cardiology Office Note  Date:  03/01/2024   ID:  Kevin Choi, DOB 03/31/1940, MRN [id number]  PCP:  Alla Amis, MD   Chief Complaint  Patient presents with   12 month follow up     Patient is moving to [place], [place] at [facility] on Monday, 03/04/2024. The patient's daughter is the HPOA. Patient denies chest pain and shortness of breath.     HPI:  84 year old gentleman with a history of  coronary artery disease,  bypass surgery with a LIMA to the LAD in 2002,  vein graft to the diagonal,  diabetes,  hypertension,  hyperlipidemia,  pleural effusion requiring a VATS,  carotid endarterectomy on the right in 2001,  TIA symptoms 3 times, once in 2002, October 2012 in March 2013.  On Xarelto  Status post bilateral total knee replacements resection of right frontal meningioma. EF 40 to 45% in 2019, down to 35 to 40% in 2022 Unchanged in September 2024 Myoview  January 2023, no significant ischemia who presents for routine followup of his coronary artery disease ,  leg swelling and shortness of breath.  LOV 5/24  Records reviewed In the ER August 2025 with headaches, some hallucinations, primary auditory Blood pressure elevated in the ER 175 systolic Has diagnosis of meningioma, previously resected, with recurrence Takes tramadol  as needed  Poor sleep, was told by primary care to take melatonin Reports that he slept better last night, wife was down at nursing facility in [place] He is moving to facility at [facility], wife will be in memory unit, he will be downstairs in a different room but will be able to see her  Weight down 212 to 185, eating less  Reports recent low blood pressure with primary care 80 systolic, was told to stop losartan , spironolactone , take Lasix  as needed On today's visit does not remember if he is taking losartan  but he did stop spironolactone  Systolic pressure 110 Minimal if any lower extremity edema, just around the sock  line  Reports no regular exercise program   I might have Lewy body  Labs  A1c  6.9 Total chol 162, LDL 90 CR  1.2  11/2021 in ER with migraine MR brain shows no acute findings small meningioma or schwannoma that has increased 2 mm in the last 4 years Given reglan   EKG personally reviewed by myself on todays visit EKG Interpretation Date/Time:  Friday March 01 2024 08:43:10 EST Ventricular Rate:  70 PR Interval:  194 QRS Duration:  154 QT Interval:  468 QTC Calculation: 505 R Axis:   0  Text Interpretation: Normal sinus rhythm Left bundle branch block When compared with ECG of 12-Oct-2023 20:03, Questionable change in QRS axis Confirmed by Perla Lye [phone]) on 03/01/2024 8:54:11 AM    Past medical history reviewed Echo,  EF 35 to 40% on 12/22 In 2019 was 40 to 34%  Stress test 1/23: no ischemia  Numb left arm, 03/2019 Left hands fingers not moving well going through folders Numbness got worse Left lip numb Lasted 45 min, then resolved without intervention No further episodes, etiology unclear  Previous MRI/MRA from 2019 reviewed Meningioma noted, chronic small vessel disease noted  05/2019 Carotid u/s, results reviewed left ICA are consistent with a 40-59% stenosis. Right is open, s/p CEA  Echo 2019 - Left ventricle: The cavity size was normal. There was moderate    concentric hypertrophy. Systolic function was mildly to    moderately reduced. The estimated ejection fraction was in the  range of 40% to 45%.   Previous hearing problem,  led to MRI of the brain Discovery of mass right side of the brain Evaluated at [facility] found to have meningioma Had resection  04/21/2017 surgery resection     May 14 2011  he had acute onset of slurred speech. He was playing cards shortly after and he was unable to do any math. Symptoms lasted for approximately one to 2 hours. Blood pressure prior to this event had been well controlled though during and after  the event, he had severe hypertension with systolic pressures sometimes greater than 200. He was in the emergency room for 2 days with extensive testing.   PMH:   has a past medical history of Allergic rhinitis due to pollen, CAD (coronary artery disease) (2002), Chicken pox, Diabetes (HCC), Diabetes mellitus, Dysplastic nevus (01/25/2008), Elevated prostate specific antigen (PSA) (02/21/2001), Headache(784.0), Hemiplegic migraine, without mention of intractable migraine without mention of status migrainosus, HLD (hyperlipidemia), HTN (hypertension) (05/08/2007), Hyperhidrosis, Impotence of organic origin (07/24/2007), Leg pain, Measles, Mumps, Nausea with vomiting, Neuropathy, Occlusion and stenosis of carotid artery without mention of cerebral infarction (09/15/2006), Peripheral neuropathy (01/31/2008), Peripheral vascular disease, Personal history of urinary calculi, Pleural effusion (2007), Restless legs syndrome (RLS) (01/10/2007), SCC (squamous cell carcinoma) (08/15/2023), Slurred speech, Squamous cell carcinoma of skin (09/14/2023), and TIA (transient ischemic attack).  PSH:    Past Surgical History:  Procedure Laterality Date   CAROTID ENDARTERECTOMY  2001   right   CATHETER REMOVAL Right 06/03/2019   CHOLECYSTECTOMY  2003   collapse lung  2007   CORONARY ARTERY BYPASS GRAFT  2001   KNEE SURGERY Left 1981   LUNG SURGERY  2007   Left; decortication left lung for persistant pleural effusion and PTX. admission ten days at [facility].   PAROTID GLAND TUMOR EXCISION  1973   VASECTOMY      Current Outpatient Medications  Medication Sig Dispense Refill   acetaminophen  (TYLENOL ) 500 MG tablet Take 500 mg by mouth every 6 (six) hours as needed. Patient states he can take 2 tablets daily as needed for pain     atorvastatin  (LIPITOR) 40 MG tablet Take 1 tablet (40 mg total) by mouth daily. 90 tablet 3   cetirizine (ZYRTEC) 10 MG tablet Take 10 mg by mouth as needed.      fenofibrate 160 MG tablet  Take 1 tablet by mouth daily.     finasteride  (PROSCAR ) 5 MG tablet TAKE 1 TABLET BY MOUTH ONCE A DAY 90 tablet 3   glipiZIDE (GLUCOTROL XL) 10 MG 24 hr tablet Take 10 mg by mouth daily.     glucose blood test strip      ketoconazole  (NIZORAL ) 2 % shampoo Apply to scalp as directed. Leave in for 10 minutes before rinsing. 120 mL 2   losartan  (COZAAR ) 25 MG tablet Take 25 mg by mouth daily.     magnesium gluconate (MAGONATE) 500 MG tablet Take 500 mg by mouth daily.     metFORMIN  (GLUCOPHAGE ) 500 MG tablet Take 500 mg by mouth 2 (two) times daily with a meal.     metoCLOPramide  (REGLAN ) 10 MG tablet Take 1 tablet (10 mg total) by mouth every 8 (eight) hours as needed for nausea (headache). 15 tablet 0   metoprolol  succinate (TOPROL -XL) 50 MG 24 hr tablet Take 1/2 tablet (25 mg total) by mouth daily. PLEASE MAKE AN APPOINTMENT IN ORDER TO RECEIVE FUTURE REFILLS. FIRST ATTEMPT. 20 tablet 0   Multiple Vitamin (MULTI-VITAMIN)  tablet Take by mouth.     nitroGLYCERIN  (NITROSTAT ) 0.4 MG SL tablet Place 1 tablet (0.4 mg total) under the tongue every 5 (five) minutes as needed for chest pain. 25 tablet 0   rOPINIRole (REQUIP) 0.5 MG tablet Take 0.5 mg by mouth at bedtime.     silodosin  (RAPAFLO ) 8 MG CAPS capsule TAKE 1 CAPSULE (8 MG TOTAL) BY MOUTH DAILY WITH BREAKFAST. 30 capsule 11   triamcinolone  lotion (KENALOG ) 0.1 % Apply 1 application topically 2 (two) times daily.     XARELTO  20 MG TABS tablet TAKE ONE TABLET BY MOUTH ONCE A DAY 90 tablet 1   furosemide  (LASIX ) 40 MG tablet Take 1 tablet (40 mg total) by mouth daily as needed.     gabapentin  (NEURONTIN ) 300 MG capsule Take 300 mg by mouth 3 (three) times daily. (Patient not taking: Reported on 03/01/2024)     Current Facility-Administered Medications  Medication Dose Route Frequency Provider Last Rate Last Admin   fluorouracil  (ADRUCIL ) chemo injection 50 mg  50 mg Intralesional Once         Allergies:   Iodinated contrast media and Other    Social History:  The patient  reports that he has quit smoking. His smoking use included cigarettes. He has a 20 pack-year smoking history. He has never used smokeless tobacco. He reports that he does not drink alcohol and does not use drugs.   Family History:   family history includes Angina in his father; Anxiety disorder in his brother; Cancer in his father and another family member; Colon polyps in his sister; Coronary artery disease in an other family member; Emphysema in his father; Heart attack in his brother and mother; Heart disease in his maternal aunt; Heart failure in his father; Lymphoma in his sister.    Review of Systems: Review of Systems  Constitutional: Negative.   HENT: Negative.    Respiratory: Negative.    Cardiovascular: Negative.   Gastrointestinal: Negative.   Musculoskeletal: Negative.   Neurological: Negative.   Psychiatric/Behavioral: Negative.    All other systems reviewed and are negative.   PHYSICAL EXAM: VS:  BP (!) 110/58 (BP Location: Left Arm, Patient Position: Sitting, Cuff Size: Normal)   Pulse 70   Ht 5' 8 (1.727 m)   Wt 185 lb 2 oz (84 kg)   SpO2 95%   BMI 28.15 kg/m  , BMI Body mass index is 28.15 kg/m. Constitutional:  oriented to person, place, and time. No distress.  HENT:  Head: Normocephalic and atraumatic.  Eyes:  no discharge. No scleral icterus.  Neck: Normal range of motion. Neck supple. No JVD present.  Cardiovascular: Normal rate, regular rhythm, normal heart sounds and intact distal pulses. Exam reveals no gallop and no friction rub. No edema No murmur heard. Pulmonary/Chest: Effort normal and breath sounds normal. No stridor. No respiratory distress.  no wheezes.  no rales.  no tenderness.  Abdominal: Soft.  no distension.  no tenderness.  Musculoskeletal: Normal range of motion.  no  tenderness or deformity.  Neurological:  normal muscle tone. Coordination normal. No atrophy Skin: Skin is warm and dry. No rash noted. not  diaphoretic.  Psychiatric:  normal mood and affect. behavior is normal. Thought content normal.     Recent Labs: 10/12/2023: ALT 22; BUN 19; Creatinine, Ser 1.40; Hemoglobin 12.3; Platelets 328; Potassium 4.3; Sodium 139    Lipid Panel Lab Results  Component Value Date   CHOL 147 06/06/2013   HDL 37.90 (L) 06/06/2013  LDLCALC 61 06/06/2013   TRIG 243.0 (H) 06/06/2013    Wt Readings from Last 3 Encounters:  03/01/24 185 lb 2 oz (84 kg)  11/22/23 122 lb 8 oz (55.6 kg)  10/12/23 214 lb 1.1 oz (97.1 kg)     ASSESSMENT AND PLAN:  Essential hypertension -  Blood pressure running low with primary care 86 systolic several weeks ago, told to stop losartan , spironolactone  and take Lasix  as needed.  Systolic up to 110 on his visit today, denies orthostasis symptoms He is uncertain if he is taking losartan  and will check for us .  Recommended if he is taking losartan  that he continue.  If it is off his list we will continue to hold for now Stressed the importance of taking Lasix  as needed for ankle swelling and consider taking for 3 pound weight gain - In the past was taking Lasix  twice a day but his fluid intake has dramatically decreased  Chronic systolic and diastolic CHF Drop in weight 214 down to 185 Chronically depressed ejection fraction, echocardiogram  12/22 ejection fraction 35 to 40%,  Unchanged in 2024  Myoview  with no significant ischemia spironolactone  off his list, possibly even the losartan  per primary care - Lasix  as needed given decreased fluid intake - Recommend checking daily weights daily and suggest Lasix  for 3 pound weight gain or for ankle swelling or worsening shortness of breath  Shortness of breath In the past has had shortness of breath secondary to deconditioning and depressed ejection fraction - Denies significant symptoms at this time,  sedentary at baseline - We have recommended regular walking program at the new facility in [facility]  Carotid  disease Stable, s/p CEA  History of TIA, prior left arm numbness Prior history of TIAs chronic small vessel disease Managed with Xarelto    Hyperlipidemia Continue Lipitor 40 daily, goal LDL less than 55  Diabetes type 2 with complications Creatinine stable 1.2 A1c relatively well-controlled, weight trending downward     Orders Placed This Encounter  Procedures   EKG 12-Lead       Signed, Velinda Lunger, M.D., Ph.D. 03/01/2024  [facility] [place], [place] [phone]

## 2024-03-01 ENCOUNTER — Encounter: Payer: Self-pay | Admitting: Cardiovascular Disease

## 2024-03-01 ENCOUNTER — Ambulatory Visit: Attending: Cardiovascular Disease | Admitting: Cardiovascular Disease

## 2024-03-01 VITALS — BP 110/58 | HR 70 | Ht 68.0 in | Wt 185.1 lb

## 2024-03-01 DIAGNOSIS — I10 Essential (primary) hypertension: Secondary | ICD-10-CM | POA: Diagnosis not present

## 2024-03-01 DIAGNOSIS — E7800 Pure hypercholesterolemia, unspecified: Secondary | ICD-10-CM

## 2024-03-01 DIAGNOSIS — I25118 Atherosclerotic heart disease of native coronary artery with other forms of angina pectoris: Secondary | ICD-10-CM | POA: Diagnosis not present

## 2024-03-01 DIAGNOSIS — G459 Transient cerebral ischemic attack, unspecified: Secondary | ICD-10-CM

## 2024-03-01 DIAGNOSIS — I420 Dilated cardiomyopathy: Secondary | ICD-10-CM

## 2024-03-01 DIAGNOSIS — I5022 Chronic systolic (congestive) heart failure: Secondary | ICD-10-CM

## 2024-03-01 DIAGNOSIS — I6523 Occlusion and stenosis of bilateral carotid arteries: Secondary | ICD-10-CM

## 2024-03-01 NOTE — Patient Instructions (Signed)

## 2024-03-11 ENCOUNTER — Telehealth: Payer: Self-pay | Admitting: Urology

## 2024-03-11 NOTE — Telephone Encounter (Signed)
 Left a message on daughter voice mail about appt . Kevin Choi states when I called him that they have moved.

## 2024-03-11 NOTE — Telephone Encounter (Signed)
 Needs renal mass protocol MRI February 2026.  Looks like there is an order in but it has not been scheduled

## 2024-12-03 ENCOUNTER — Ambulatory Visit: Admitting: Urology
